# Patient Record
Sex: Female | Born: 1940 | Race: White | Hispanic: No | Smoking: Never smoker
Health system: Southern US, Community
[De-identification: ages and names within clinical notes are randomized; demographics above are authoritative.]

## PROBLEM LIST (undated history)

## (undated) DIAGNOSIS — R5383 Other fatigue: Secondary | ICD-10-CM

## (undated) DIAGNOSIS — R1310 Dysphagia, unspecified: Secondary | ICD-10-CM

## (undated) DIAGNOSIS — J479 Bronchiectasis, uncomplicated: Secondary | ICD-10-CM

## (undated) DIAGNOSIS — M8580 Other specified disorders of bone density and structure, unspecified site: Secondary | ICD-10-CM

## (undated) DIAGNOSIS — K5790 Diverticulosis of intestine, part unspecified, without perforation or abscess without bleeding: Secondary | ICD-10-CM

## (undated) DIAGNOSIS — K219 Gastro-esophageal reflux disease without esophagitis: Secondary | ICD-10-CM

## (undated) DIAGNOSIS — D649 Anemia, unspecified: Secondary | ICD-10-CM

## (undated) DIAGNOSIS — R0600 Dyspnea, unspecified: Secondary | ICD-10-CM

## (undated) DIAGNOSIS — Z923 Personal history of irradiation: Secondary | ICD-10-CM

## (undated) DIAGNOSIS — K838 Other specified diseases of biliary tract: Secondary | ICD-10-CM

## (undated) DIAGNOSIS — C50911 Malignant neoplasm of unspecified site of right female breast: Secondary | ICD-10-CM

## (undated) DIAGNOSIS — R634 Abnormal weight loss: Secondary | ICD-10-CM

## (undated) DIAGNOSIS — R110 Nausea: Secondary | ICD-10-CM

## (undated) DIAGNOSIS — G252 Other specified forms of tremor: Secondary | ICD-10-CM

## (undated) DIAGNOSIS — J189 Pneumonia, unspecified organism: Secondary | ICD-10-CM

## (undated) DIAGNOSIS — T7840XA Allergy, unspecified, initial encounter: Secondary | ICD-10-CM

## (undated) DIAGNOSIS — R5381 Other malaise: Secondary | ICD-10-CM

## (undated) DIAGNOSIS — N83209 Unspecified ovarian cyst, unspecified side: Secondary | ICD-10-CM

## (undated) DIAGNOSIS — M722 Plantar fascial fibromatosis: Secondary | ICD-10-CM

## (undated) DIAGNOSIS — R7989 Other specified abnormal findings of blood chemistry: Secondary | ICD-10-CM

## (undated) DIAGNOSIS — H269 Unspecified cataract: Secondary | ICD-10-CM

## (undated) DIAGNOSIS — E538 Deficiency of other specified B group vitamins: Secondary | ICD-10-CM

## (undated) DIAGNOSIS — A310 Pulmonary mycobacterial infection: Secondary | ICD-10-CM

## (undated) DIAGNOSIS — M5126 Other intervertebral disc displacement, lumbar region: Secondary | ICD-10-CM

## (undated) DIAGNOSIS — G43909 Migraine, unspecified, not intractable, without status migrainosus: Secondary | ICD-10-CM

## (undated) HISTORY — PX: COLONOSCOPY: SHX174

## (undated) HISTORY — DX: Other specified disorders of bone density and structure, unspecified site: M85.80

## (undated) HISTORY — DX: Deficiency of other specified B group vitamins: E53.8

## (undated) HISTORY — DX: Nausea: R11.0

## (undated) HISTORY — DX: Bronchiectasis, uncomplicated: J47.9

## (undated) HISTORY — DX: Unspecified cataract: H26.9

## (undated) HISTORY — PX: LAPAROSCOPIC CHOLECYSTECTOMY: SUR755

## (undated) HISTORY — PX: NOSE SURGERY: SHX723

## (undated) HISTORY — DX: Unspecified ovarian cyst, unspecified side: N83.209

## (undated) HISTORY — DX: Anemia, unspecified: D64.9

## (undated) HISTORY — DX: Other fatigue: R53.83

## (undated) HISTORY — DX: Diverticulosis of intestine, part unspecified, without perforation or abscess without bleeding: K57.90

## (undated) HISTORY — PX: TOTAL VAGINAL HYSTERECTOMY: SHX2548

## (undated) HISTORY — PX: BREAST LUMPECTOMY: SHX2

## (undated) HISTORY — DX: Migraine, unspecified, not intractable, without status migrainosus: G43.909

## (undated) HISTORY — PX: MASTECTOMY PARTIAL / LUMPECTOMY: SUR851

## (undated) HISTORY — DX: Allergy, unspecified, initial encounter: T78.40XA

## (undated) HISTORY — DX: Other malaise: R53.81

## (undated) HISTORY — DX: Other specified abnormal findings of blood chemistry: R79.89

## (undated) HISTORY — PX: MASTOIDECTOMY: SHX711

## (undated) HISTORY — PX: MYRINGOTOMY: SUR874

## (undated) HISTORY — PX: UPPER GASTROINTESTINAL ENDOSCOPY: SHX188

## (undated) HISTORY — DX: Abnormal weight loss: R63.4

## (undated) HISTORY — DX: Other intervertebral disc displacement, lumbar region: M51.26

## (undated) HISTORY — PX: TONSILLECTOMY: SUR1361

## (undated) HISTORY — PX: CATARACT EXTRACTION: SUR2

## (undated) HISTORY — DX: Malignant neoplasm of unspecified site of right female breast: C50.911

## (undated) HISTORY — PX: ABDOMINAL HYSTERECTOMY: SHX81

## (undated) HISTORY — DX: Dysphagia, unspecified: R13.10

## (undated) HISTORY — DX: Other specified forms of tremor: G25.2

## (undated) HISTORY — DX: Pulmonary mycobacterial infection: A31.0

## (undated) HISTORY — DX: Other specified diseases of biliary tract: K83.8

## (undated) HISTORY — DX: Gastro-esophageal reflux disease without esophagitis: K21.9

## (undated) HISTORY — DX: Plantar fascial fibromatosis: M72.2

---

## 1997-09-03 ENCOUNTER — Emergency Department (HOSPITAL_COMMUNITY): Admission: EM | Admit: 1997-09-03 | Discharge: 1997-09-03 | Payer: Self-pay | Admitting: Internal Medicine

## 1997-09-13 ENCOUNTER — Emergency Department (HOSPITAL_COMMUNITY): Admission: EM | Admit: 1997-09-13 | Discharge: 1997-09-13 | Payer: Self-pay

## 1997-10-18 ENCOUNTER — Other Ambulatory Visit: Admission: RE | Admit: 1997-10-18 | Discharge: 1997-10-18 | Payer: Self-pay | Admitting: Obstetrics and Gynecology

## 1999-02-02 ENCOUNTER — Other Ambulatory Visit: Admission: RE | Admit: 1999-02-02 | Discharge: 1999-02-02 | Payer: Self-pay | Admitting: Obstetrics and Gynecology

## 2000-01-25 ENCOUNTER — Other Ambulatory Visit: Admission: RE | Admit: 2000-01-25 | Discharge: 2000-01-25 | Payer: Self-pay | Admitting: Obstetrics and Gynecology

## 2000-09-29 ENCOUNTER — Ambulatory Visit: Admission: RE | Admit: 2000-09-29 | Discharge: 2000-09-29 | Payer: Self-pay | Admitting: Pulmonary Disease

## 2001-01-07 ENCOUNTER — Other Ambulatory Visit: Admission: RE | Admit: 2001-01-07 | Discharge: 2001-01-07 | Payer: Self-pay | Admitting: Obstetrics and Gynecology

## 2001-01-12 ENCOUNTER — Ambulatory Visit (HOSPITAL_COMMUNITY): Admission: RE | Admit: 2001-01-12 | Discharge: 2001-01-12 | Payer: Self-pay | Admitting: Internal Medicine

## 2001-11-17 ENCOUNTER — Encounter: Payer: Self-pay | Admitting: Surgery

## 2001-11-18 ENCOUNTER — Encounter (INDEPENDENT_AMBULATORY_CARE_PROVIDER_SITE_OTHER): Payer: Self-pay | Admitting: Specialist

## 2001-11-18 ENCOUNTER — Observation Stay (HOSPITAL_COMMUNITY): Admission: RE | Admit: 2001-11-18 | Discharge: 2001-11-19 | Payer: Self-pay | Admitting: Surgery

## 2001-11-18 ENCOUNTER — Encounter: Payer: Self-pay | Admitting: Surgery

## 2002-03-30 ENCOUNTER — Emergency Department (HOSPITAL_COMMUNITY): Admission: EM | Admit: 2002-03-30 | Discharge: 2002-03-30 | Payer: Self-pay | Admitting: Emergency Medicine

## 2003-01-07 ENCOUNTER — Encounter: Admission: RE | Admit: 2003-01-07 | Discharge: 2003-01-07 | Payer: Self-pay | Admitting: Surgery

## 2003-01-25 ENCOUNTER — Ambulatory Visit (HOSPITAL_BASED_OUTPATIENT_CLINIC_OR_DEPARTMENT_OTHER): Admission: RE | Admit: 2003-01-25 | Discharge: 2003-01-25 | Payer: Self-pay | Admitting: Surgery

## 2003-01-25 ENCOUNTER — Ambulatory Visit (HOSPITAL_COMMUNITY): Admission: RE | Admit: 2003-01-25 | Discharge: 2003-01-25 | Payer: Self-pay | Admitting: Surgery

## 2003-01-25 ENCOUNTER — Encounter (INDEPENDENT_AMBULATORY_CARE_PROVIDER_SITE_OTHER): Payer: Self-pay | Admitting: *Deleted

## 2004-01-30 ENCOUNTER — Ambulatory Visit: Payer: Self-pay | Admitting: Pulmonary Disease

## 2004-05-08 ENCOUNTER — Observation Stay (HOSPITAL_COMMUNITY): Admission: EM | Admit: 2004-05-08 | Discharge: 2004-05-09 | Payer: Self-pay | Admitting: Emergency Medicine

## 2004-06-13 ENCOUNTER — Ambulatory Visit: Payer: Self-pay | Admitting: Pulmonary Disease

## 2004-07-26 ENCOUNTER — Ambulatory Visit: Payer: Self-pay | Admitting: Pulmonary Disease

## 2004-10-23 ENCOUNTER — Ambulatory Visit: Payer: Self-pay | Admitting: Internal Medicine

## 2004-11-06 ENCOUNTER — Ambulatory Visit: Payer: Self-pay | Admitting: Pulmonary Disease

## 2004-11-20 ENCOUNTER — Ambulatory Visit: Payer: Self-pay | Admitting: Pulmonary Disease

## 2004-12-11 ENCOUNTER — Ambulatory Visit: Payer: Self-pay | Admitting: Pulmonary Disease

## 2004-12-14 ENCOUNTER — Ambulatory Visit: Payer: Self-pay | Admitting: Internal Medicine

## 2004-12-27 ENCOUNTER — Ambulatory Visit: Payer: Self-pay | Admitting: Pulmonary Disease

## 2004-12-27 LAB — PULMONARY FUNCTION TEST

## 2005-01-24 ENCOUNTER — Ambulatory Visit: Payer: Self-pay | Admitting: Pulmonary Disease

## 2005-01-30 ENCOUNTER — Ambulatory Visit: Payer: Self-pay | Admitting: Internal Medicine

## 2005-05-13 ENCOUNTER — Ambulatory Visit: Payer: Self-pay | Admitting: Emergency Medicine

## 2005-05-16 ENCOUNTER — Ambulatory Visit: Admission: RE | Admit: 2005-05-16 | Discharge: 2005-05-16 | Payer: Self-pay | Admitting: Emergency Medicine

## 2005-06-17 ENCOUNTER — Ambulatory Visit: Payer: Self-pay | Admitting: Emergency Medicine

## 2005-09-03 ENCOUNTER — Ambulatory Visit: Payer: Self-pay | Admitting: Emergency Medicine

## 2005-09-18 ENCOUNTER — Ambulatory Visit: Payer: Self-pay | Admitting: Emergency Medicine

## 2005-10-11 ENCOUNTER — Ambulatory Visit (HOSPITAL_COMMUNITY): Admission: RE | Admit: 2005-10-11 | Discharge: 2005-10-11 | Payer: Self-pay | Admitting: Emergency Medicine

## 2006-01-01 ENCOUNTER — Ambulatory Visit: Payer: Self-pay | Admitting: Emergency Medicine

## 2006-02-07 ENCOUNTER — Ambulatory Visit: Payer: Self-pay | Admitting: Emergency Medicine

## 2006-02-14 ENCOUNTER — Encounter: Admission: RE | Admit: 2006-02-14 | Discharge: 2006-02-14 | Payer: Self-pay | Admitting: Otolaryngology

## 2006-03-24 ENCOUNTER — Ambulatory Visit: Payer: Self-pay | Admitting: Infectious Diseases

## 2006-03-24 DIAGNOSIS — A310 Pulmonary mycobacterial infection: Secondary | ICD-10-CM | POA: Insufficient documentation

## 2006-03-24 DIAGNOSIS — L0390 Cellulitis, unspecified: Secondary | ICD-10-CM

## 2006-03-24 DIAGNOSIS — M818 Other osteoporosis without current pathological fracture: Secondary | ICD-10-CM

## 2006-03-24 DIAGNOSIS — L0291 Cutaneous abscess, unspecified: Secondary | ICD-10-CM

## 2006-07-02 ENCOUNTER — Ambulatory Visit: Payer: Self-pay | Admitting: Infectious Diseases

## 2006-08-28 ENCOUNTER — Ambulatory Visit: Payer: Self-pay | Admitting: Emergency Medicine

## 2006-08-29 ENCOUNTER — Ambulatory Visit: Payer: Self-pay | Admitting: Cardiology

## 2006-10-06 ENCOUNTER — Ambulatory Visit: Payer: Self-pay | Admitting: Emergency Medicine

## 2006-12-04 DIAGNOSIS — J449 Chronic obstructive pulmonary disease, unspecified: Secondary | ICD-10-CM | POA: Insufficient documentation

## 2006-12-04 DIAGNOSIS — J479 Bronchiectasis, uncomplicated: Secondary | ICD-10-CM | POA: Insufficient documentation

## 2006-12-04 DIAGNOSIS — G43909 Migraine, unspecified, not intractable, without status migrainosus: Secondary | ICD-10-CM | POA: Insufficient documentation

## 2007-01-13 ENCOUNTER — Ambulatory Visit: Payer: Self-pay | Admitting: Emergency Medicine

## 2007-01-16 ENCOUNTER — Ambulatory Visit: Payer: Self-pay | Admitting: Internal Medicine

## 2007-01-16 ENCOUNTER — Ambulatory Visit: Admission: RE | Admit: 2007-01-16 | Discharge: 2007-01-16 | Payer: Self-pay | Admitting: Emergency Medicine

## 2007-01-21 ENCOUNTER — Ambulatory Visit: Admission: RE | Admit: 2007-01-21 | Discharge: 2007-01-21 | Payer: Self-pay | Admitting: Emergency Medicine

## 2007-01-28 ENCOUNTER — Ambulatory Visit: Payer: Self-pay | Admitting: Emergency Medicine

## 2007-01-28 DIAGNOSIS — J309 Allergic rhinitis, unspecified: Secondary | ICD-10-CM | POA: Insufficient documentation

## 2007-02-04 ENCOUNTER — Ambulatory Visit: Admission: RE | Admit: 2007-02-04 | Discharge: 2007-02-04 | Payer: Self-pay | Admitting: Emergency Medicine

## 2007-02-04 ENCOUNTER — Encounter: Payer: Self-pay | Admitting: Emergency Medicine

## 2007-02-04 ENCOUNTER — Ambulatory Visit: Payer: Self-pay | Admitting: Emergency Medicine

## 2007-02-20 ENCOUNTER — Encounter: Admission: RE | Admit: 2007-02-20 | Discharge: 2007-02-20 | Payer: Self-pay | Admitting: Radiology

## 2007-02-24 ENCOUNTER — Telehealth (INDEPENDENT_AMBULATORY_CARE_PROVIDER_SITE_OTHER): Payer: Self-pay | Admitting: *Deleted

## 2007-03-02 ENCOUNTER — Encounter: Admission: RE | Admit: 2007-03-02 | Discharge: 2007-03-02 | Payer: Self-pay | Admitting: Obstetrics and Gynecology

## 2007-03-17 ENCOUNTER — Ambulatory Visit: Payer: Self-pay | Admitting: Emergency Medicine

## 2007-03-24 ENCOUNTER — Encounter: Admission: RE | Admit: 2007-03-24 | Discharge: 2007-03-24 | Payer: Self-pay | Admitting: Surgery

## 2007-03-24 ENCOUNTER — Encounter (INDEPENDENT_AMBULATORY_CARE_PROVIDER_SITE_OTHER): Payer: Self-pay | Admitting: Surgery

## 2007-03-24 ENCOUNTER — Ambulatory Visit (HOSPITAL_BASED_OUTPATIENT_CLINIC_OR_DEPARTMENT_OTHER): Admission: RE | Admit: 2007-03-24 | Discharge: 2007-03-24 | Payer: Self-pay | Admitting: Surgery

## 2007-04-07 ENCOUNTER — Ambulatory Visit (HOSPITAL_BASED_OUTPATIENT_CLINIC_OR_DEPARTMENT_OTHER): Admission: RE | Admit: 2007-04-07 | Discharge: 2007-04-07 | Payer: Self-pay | Admitting: Surgery

## 2007-04-07 ENCOUNTER — Encounter (INDEPENDENT_AMBULATORY_CARE_PROVIDER_SITE_OTHER): Payer: Self-pay | Admitting: Surgery

## 2007-04-15 ENCOUNTER — Ambulatory Visit: Payer: Self-pay | Admitting: Oncology

## 2007-04-17 ENCOUNTER — Ambulatory Visit: Admission: RE | Admit: 2007-04-17 | Discharge: 2007-07-02 | Payer: Self-pay | Admitting: Radiation Oncology

## 2007-05-04 LAB — CBC WITH DIFFERENTIAL/PLATELET
Basophils Absolute: 0 10*3/uL (ref 0.0–0.1)
EOS%: 5.5 % (ref 0.0–7.0)
Eosinophils Absolute: 0.3 10*3/uL (ref 0.0–0.5)
HCT: 38.4 % (ref 34.8–46.6)
HGB: 13.6 g/dL (ref 11.6–15.9)
MCH: 32.8 pg (ref 26.0–34.0)
MONO#: 0.3 10*3/uL (ref 0.1–0.9)
NEUT#: 3.2 10*3/uL (ref 1.5–6.5)
NEUT%: 69.3 % (ref 39.6–76.8)
RDW: 13.4 % (ref 11.3–14.5)
WBC: 4.7 10*3/uL (ref 3.9–10.0)
lymph#: 0.9 10*3/uL (ref 0.9–3.3)

## 2007-05-05 LAB — COMPREHENSIVE METABOLIC PANEL
ALT: 11 U/L (ref 0–35)
CO2: 24 mEq/L (ref 19–32)
Calcium: 9.3 mg/dL (ref 8.4–10.5)
Chloride: 106 mEq/L (ref 96–112)
Glucose, Bld: 102 mg/dL — ABNORMAL HIGH (ref 70–99)
Sodium: 141 mEq/L (ref 135–145)
Total Bilirubin: 0.6 mg/dL (ref 0.3–1.2)
Total Protein: 6.7 g/dL (ref 6.0–8.3)

## 2007-05-05 LAB — LACTATE DEHYDROGENASE: LDH: 142 U/L (ref 94–250)

## 2007-05-05 LAB — VITAMIN D 25 HYDROXY (VIT D DEFICIENCY, FRACTURES): Vit D, 25-Hydroxy: 21 ng/mL — ABNORMAL LOW (ref 30–89)

## 2007-05-07 LAB — VITAMIN D 1,25 DIHYDROXY: Vit D, 1,25-Dihydroxy: 58 pg/mL (ref 6–62)

## 2007-07-27 ENCOUNTER — Ambulatory Visit: Payer: Self-pay | Admitting: Oncology

## 2009-08-10 ENCOUNTER — Telehealth (INDEPENDENT_AMBULATORY_CARE_PROVIDER_SITE_OTHER): Payer: Self-pay | Admitting: *Deleted

## 2009-08-11 ENCOUNTER — Ambulatory Visit: Payer: Self-pay | Admitting: Emergency Medicine

## 2009-08-11 DIAGNOSIS — C50919 Malignant neoplasm of unspecified site of unspecified female breast: Secondary | ICD-10-CM | POA: Insufficient documentation

## 2009-08-11 DIAGNOSIS — R05 Cough: Secondary | ICD-10-CM | POA: Insufficient documentation

## 2009-08-11 DIAGNOSIS — R059 Cough, unspecified: Secondary | ICD-10-CM | POA: Insufficient documentation

## 2009-08-31 ENCOUNTER — Ambulatory Visit: Payer: Self-pay | Admitting: Emergency Medicine

## 2009-09-05 ENCOUNTER — Encounter: Payer: Self-pay | Admitting: Emergency Medicine

## 2010-01-17 ENCOUNTER — Ambulatory Visit: Payer: Self-pay | Admitting: Emergency Medicine

## 2010-02-15 ENCOUNTER — Ambulatory Visit: Payer: Self-pay | Admitting: Emergency Medicine

## 2010-02-16 ENCOUNTER — Ambulatory Visit: Payer: Self-pay | Admitting: Cardiovascular Disease

## 2010-02-28 ENCOUNTER — Ambulatory Visit
Admission: RE | Admit: 2010-02-28 | Discharge: 2010-02-28 | Payer: Self-pay | Source: Home / Self Care | Attending: Emergency Medicine | Admitting: Emergency Medicine

## 2010-03-20 NOTE — Assessment & Plan Note (Signed)
Summary: cough   Visit Type:  Follow-up Primary Provider/Referring Provider:  Dr. Jarome Matin  CC:  Cough follow-up. The cough is no better. Hoarseness has improved. Cough is worse at night. Nasal wahes have helped with nasal congestion.Marland Kitchen  History of Present Illness: [name] is a 70 year old woman with a history of bronchiectasis, Mycobacterium avium complex colonization, allergic rhinitis and chronic cough.    ROV 08/11/09 -- returns for acute visit, has hx bronchiectasis, allergies, chronic cough. Had been doing well until about 3 weeks ago with nasal drainage, hoarse voice. Was treated with a brief course pred, nasal steroid, doxycycline. While on the pred her UA irritation was better. She is having indigestion and heartburn when she lays down at night. Still nasal drainage.   ROV 08/31/09 -- follows up for cough, hoarse voice. We tried a 2 weeks course of prilosec, added NSW's at bedtime. Was on nasonex, ran out since last time, not currently on it. Stil doing a lot of throat clearing. her hoarseness is a lot better, still coughing every day but less productive. She is also complaining of more SOB. Still with some heartburn, happens when she lays supine.   Current Medications (verified): 1)  Imitrex 100 Mg  Tabs (Sumatriptan Succinate) .Marland Kitchen.. 1 Tab By Mouth As Needed 2)  Imitrex Statdose System 6 Mg/0.83ml  Kit (Sumatriptan Succinate) .... As Needed 3)  Topamax 200 Mg  Tabs (Topiramate) .... Take 1 Tablet By Mouth Once A Day 4)  Ibuprofen 200 Mg Tabs (Ibuprofen) .... As Needed 5)  Nasonex 50 Mcg/act Susp (Mometasone Furoate) .... 2 Sprays in Each Nostril Daily 6)  Nasal Wash .... Once Daily  Allergies (verified): 1)  ! Sulfa 2)  ! Barbiturates 3)  ! Codeine 4)  ! * Dhe 5)  ! Demerol 6)  ! * Droperidol  Vital Signs:  Patient profile:   70 year old female Height:      67 inches (170.18 cm) Weight:      143 pounds (65 kg) BMI:     22.48 O2 Sat:      97 % on Room air Temp:      98.0 degrees F (36.67 degrees C) oral Pulse rate:   91 / minute BP sitting:   110 / 82  (left arm) Cuff size:   regular  Vitals Entered By: Michel Bickers CMA (August 31, 2009 2:51 PM)  O2 Sat at Rest %:  97 O2 Flow:  Room air CC: Cough follow-up. The cough is no better. Hoarseness has improved. Cough is worse at night. Nasal wahes have helped with nasal congestion. Comments Medications reviewed. Daytime phone verified. Michel Bickers CMA  August 31, 2009 2:54 PM   Physical Exam  General:  well developed, well nourished, in no acute distress Head:  normocephalic and atraumatic Nose:  no deformity, discharge, inflammation, or lesions Mouth:  no deformity or lesions Neck:  no masses, thyromegaly, or abnormal cervical nodes, hoarse voice Chest Wall:  no deformities noted Lungs:  clear bilaterally Heart:  regular rate and rhythm, S1, S2 without murmurs, rubs, gallops, or clicks Abdomen:  not examined Msk:  no deformity or scoliosis noted with normal posture Extremities:  no clubbing, cyanosis, edema, or deformity noted Psych:  alert and cooperative; normal mood and affect; normal attention span and concentration   Impression & Recommendations:  Problem # 1:  COUGH (ICD-786.2)  Continue to do nasal saline washes every day restart omeprazole 20mg  by mouth once daily  start fluticasone nasal spray,  2 sprays each nostril two times a day start loratadine 10mg  by mouth once daily  We will set up follow up with Dr Lazarus Salines regarding your sinuses and nasal congestion.  Follow up with Dr Delton Coombes in 1 month. If you are not improving then we may decide to perform a CT scan or pulmonary function testing.   Orders: Est. Patient Level IV (09811)  Problem # 2:  BRONCHIECTASIS (ICD-494.0)  Problem # 3:  COPD, MILD (ICD-496)  Medications Added to Medication List This Visit: 1)  Nasal Wash  .... Once daily  Patient Instructions: 1)  Continue to do nasal saline washes every day 2)  restart  omeprazole 20mg  by mouth once daily  3)  start fluticasone nasal spray, 2 sprays each nostril two times a day 4)  start loratadine 10mg  by mouth once daily  5)  We will set up follow up with Dr Lazarus Salines regarding your sinuses and nasal congestion.  6)  Follow up with Dr Delton Coombes in 1 month. If you are not improving then we may decide to perform a CT scan or pulmonary function testing.

## 2010-03-20 NOTE — Progress Notes (Signed)
Summary: SOB/ COUGH  Phone Note Call from Patient   Caller: Patient Call For: byrum Summary of Call: pt has not been seen since 03/17/07. today c/o persistant cough/ SOB/ hoarsness x 2 wks. saw her pcp for this. tx w/ 10 days of doxycycline and 5 days of prednisone. was told by her pcp today- dr Jarold Motto w/ [facility]- to f/u w/ pulm dr since she isn't better. i made an appt w/ rb's 1st avail- 7/14. i did offer an appt w/ tp sooner (which she did not want) but did ask if dr byrum would see her sooner.  (appt w/ rb remains in the meantime) call cell [phone] first or home #above.  Initial call taken by: Tivis Ringer, CNA,  August 10, 2009 3:02 PM  Follow-up for Phone Call        Lawson Fiscal, pls advise if pt can be worked in with RB before scheduled appt on 7/14.  Thanks! Gweneth Dimitri RN  August 10, 2009 3:11 PM   The patient will see RB on 08/11/2009 @ 4:30 and will bring her most recent CXR from [facility] so RB can review. Follow-up by: Michel Bickers CMA,  August 10, 2009 5:05 PM

## 2010-03-20 NOTE — Assessment & Plan Note (Signed)
Summary: bronchiectasis, cough   Visit Type:  Follow-up Primary Provider/Referring Provider:  Dr. Jarome Matin  CC:  Bronchiectasis patient last seen 03/17/2007. The patient c/o sinus pressure and drainage for several weeks. PCP gave her 10 days on doxy and 5 days on Prednisone. She did improve slightly at the time but her symptoms came right back. She says her cough is dry and she is hoarse.Marland Kitchen  History of Present Illness: [name] is a 70 year old woman with a history of bronchiectasis, Mycobacterium avium complex colonization, allergic rhinitis and chronic cough.    ROV 08/11/09 -- returns for acute visit, has hx bronchiectasis, allergies, chronic cough. Had been doing well until about 3 weeks ago with nasal drainage, hoarse voice. Was treated with a brief course pred, nasal steroid, doxycycline. While on the pred her UA irritation was better. She is having indigestion and heartburn when she lays down at night. Still nasal drainage.   Current Medications (verified): 1)  Imitrex 100 Mg  Tabs (Sumatriptan Succinate) .Marland Kitchen.. 1 Tab By Mouth As Needed 2)  Imitrex Statdose System 6 Mg/0.68ml  Kit (Sumatriptan Succinate) .... As Needed 3)  Topamax 200 Mg  Tabs (Topiramate) .... Take 1 Tablet By Mouth Once A Day 4)  Ibuprofen 200 Mg Tabs (Ibuprofen) .... As Needed 5)  Nasonex 50 Mcg/act Susp (Mometasone Furoate) .... 2 Sprays in Each Nostril Daily  Allergies (verified): 1)  ! Sulfa 2)  ! Barbiturates 3)  ! Codeine 4)  ! * Dhe 5)  ! Demerol 6)  ! 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 The patient c/o sinus pressure and drainage for several weeks. PCP gave her 10 days on doxy and 5 days on Prednisone. She did improve slightly at the time but her symptoms came right back. She says her cough is dry and she is hoarse. Comments Medications reviewed. Daytime phone verified. Michel Bickers CMA  August 11, 2009 4:41 PM   Physical Exam  General:  well developed, well nourished, in no acute distress Head:  normocephalic and atraumatic Nose:  no deformity, discharge, inflammation, or lesions Mouth:  no deformity or lesions Neck:  no masses, thyromegaly, or abnormal cervical nodes, hoarse voice Chest Wall:  no deformities noted Lungs:  clear bilaterally Heart:  regular rate and rhythm, S1, S2 without murmurs, rubs, gallops, or clicks Abdomen:  not examined Msk:  no deformity or scoliosis noted with normal posture Extremities:  no clubbing, cyanosis, edema, or deformity noted Psych:  alert and cooperative; normal mood and affect; normal attention span and concentration   Impression &  Recommendations:  Problem # 1:  COUGH (ICD-786.2)  In aftermath of recent URI. Need to atempt to treat factors affecting cough: GERD, allergic rhinitis,  - NSW - nasal streroid - treat GERD short term - no eval for bronchiectasis progresion at this time, consider CT if persists or progresses.   Orders: Est. Patient Level IV (16109)  Medications Added to Medication List This Visit: 1)  Ibuprofen 200 Mg Tabs (Ibuprofen) .... As needed 2)  Nasonex 50 Mcg/act Susp (Mometasone furoate)  .... 2 sprays in each nostril daily  Patient Instructions: 1)  Start doing Nasal Saline Washes once daily for the next 2 weeks 2)  Continue your nasacort once daily 3)  Start omeprazole (Prilosec) 20mg  by mouth once daily for the next 2 weeks 4)  Try to avoid throat clearing and rest your voice as much as possible.  5)  Follow with Dr Delton Coombes in 3 weeks or as needed

## 2010-03-20 NOTE — Letter (Signed)
Summary: [facility]  [facility]   Imported By: Lester [place] 09/22/2009 13:24:49  _____________________________________________________________________  External Attachment:    Type:   Image     Comment:   External Document

## 2010-03-20 NOTE — Assessment & Plan Note (Signed)
Summary: URI, cough, bronchiectasis   Visit Type:  Follow-up Primary Provider/Referring Provider:  Dr. Jarome Matin  CC:  Pt c/o cough and sinus congestion w/ thick yellow to green sputum...sxs started after taking Clindamycin 300mg  three times daily for 7 days after root canal.  History of Present Illness: [name] is a 70 year old woman with a history of bronchiectasis, Mycobacterium avium complex colonization, allergic rhinitis and chronic cough.    ROV 08/11/09 -- returns for acute visit, has hx bronchiectasis, allergies, chronic cough. Had been doing well until about 3 weeks ago with nasal drainage, hoarse voice. Was treated with a brief course pred, nasal steroid, doxycycline. While on the pred her UA irritation was better. She is having indigestion and heartburn when she lays down at night. Still nasal drainage.   ROV 08/31/09 -- follows up for cough, hoarse voice. We tried a 2 weeks course of prilosec, added NSW's at bedtime. Was on nasonex, ran out since last time, not currently on it. Stil doing a lot of throat clearing. her hoarseness is a lot better, still coughing every day but less productive. She is also complaining of more SOB. Still with some heartburn, happens when she lays supine.   ROV 01/17/10 -- follow up for her allergic rhinitis, chronic cough, bronchiectasis with MAIC colonization. has been blowing thick mucous from head and sinuses, started cough. Clear thick sputum. Has used [employer], doing NSW's, fluticasone spray. Not taking any loratadine - had more nasal congestion with it. No decongestants currently. Having HA's, some green nasal mucous.   Current Medications (verified): 1)  Imitrex 100 Mg  Tabs (Sumatriptan Succinate) .Marland Kitchen.. 1 Tab By Mouth As Needed 2)  Imitrex Statdose System 6 Mg/0.20ml  Kit (Sumatriptan Succinate) .... As Needed 3)  Topamax 200 Mg  Tabs (Topiramate) .... Take 1 Tablet By Mouth Once A Day 4)  Ibuprofen 200 Mg Tabs (Ibuprofen) .... As  Needed 5)  Fluticasone Propionate 50 Mcg/act Susp (Fluticasone Propionate) .... 2 Sprays Each Nostril Two Times A Day 6)  Nasal Wash .... Once Daily 7)  Omeprazole 20 Mg Cpdr (Omeprazole) .Marland Kitchen.. 1 By Mouth Once Daily 8)  Bupropion Hcl 100 Mg Tabs (Bupropion Hcl) .Marland Kitchen.. 1 By Mouth Daily 9)  Benzonatate 200 Mg Caps (Benzonatate) .Marland Kitchen.. 1 By Mouth 2 To 3 Times Daily As Needed  Allergies (verified): 1)  ! Sulfa 2)  ! Barbiturates 3)  ! Codeine 4)  ! * Dhe 5)  ! Demerol 6)  ! * Droperidol  Vital Signs:  Patient profile:   70 year old female Height:      67 inches (170.18 cm) Weight:      145.50 pounds (66.14 kg) BMI:     22.87 O2 Sat:      97 % on Room air Temp:     97.9 degrees F (36.61 degrees C) oral Pulse rate:   103 / minute BP sitting:   124 / 74  (right arm) Cuff size:   regular  Vitals Entered By: Michel Bickers CMA (January 17, 2010 4:33 PM)  O2 Sat at Rest %:  97 O2 Flow:  Room air CC: Pt c/o cough and sinus congestion w/ thick yellow to green sputum...sxs started after taking Clindamycin 300mg  three times daily for 7 days after root canal Comments Medications reviewed with patient Michel Bickers CMA  January 17, 2010 4:34 PM   Physical Exam  General:  well developed, well nourished, in no acute distress Head:  normocephalic and atraumatic, no sinus  Nose:  no deformity, discharge, inflammation, or lesions Mouth:  no deformity or lesions Neck:  no masses, thyromegaly, or abnormal cervical nodes, hoarse voice Chest Wall:  no deformities noted Lungs:  clear bilaterally Heart:  regular rate and rhythm, S1, S2 without murmurs, rubs, gallops, or clicks Abdomen:  not examined Msk:  no deformity or scoliosis noted with normal posture Extremities:  no clubbing, cyanosis, edema, or deformity noted Psych:  alert and cooperative; normal mood and affect; normal attention span and concentration   Impression & Recommendations:  Problem # 1:  COUGH (ICD-786.2)  Due to viral  URI. Continue your nasal saline washes Continue your fluticasone spray two times a day Stop loratadine Use OTC decongestants that contain chlorphenerimine or bromphenerimine Call our office if you develop fevers, your mucous changes to dark color, or if you feel worse in any way.  Follow up in 1 month   Orders: Est. Patient Level IV (16109)  Problem # 2:  BRONCHIECTASIS (ICD-494.0) will reassess her after this illness resolves, decide whether any other workup (CT scan, FOB for [facility]) is necessary.   Medications Added to Medication List This Visit: 1)  Bupropion Hcl 100 Mg Tabs (Bupropion hcl) .Marland Kitchen.. 1 by mouth daily 2)  Benzonatate 200 Mg Caps (Benzonatate) .Marland Kitchen.. 1 by mouth 2 to 3 times daily as needed  Patient Instructions: 1)  Continue your nasal saline washes 2)  Continue your fluticasone spray two times a day 3)  Stop loratadine 4)  Use OTC decongestants that contain chlorphenerimine or bromphenerimine 5)  Call our office if you develop fevers, your mucous changes to dark color, or if you feel worse in any way.  6)  Follow up in 1 month    Immunization History:  Influenza Immunization History:    Influenza:  historical (12/07/2009)

## 2010-03-20 NOTE — Miscellaneous (Signed)
Summary: Orders Update  Clinical Lists Changes  Medications: Changed medication from NASONEX 50 MCG/ACT SUSP (MOMETASONE FUROATE) 2 sprays in each nostril daily to FLUTICASONE PROPIONATE 50 MCG/ACT SUSP (FLUTICASONE PROPIONATE) 2 sprays each nostril two times a day - Signed Added new medication of LORATADINE 10 MG TABS (LORATADINE) 1 by mouth once daily - Signed Added new medication of OMEPRAZOLE 20 MG CPDR (OMEPRAZOLE) 1 by mouth once daily - Signed Rx of FLUTICASONE PROPIONATE 50 MCG/ACT SUSP (FLUTICASONE PROPIONATE) 2 sprays each nostril two times a day;  #1 x 5;  Signed;  Entered by: Leslye Peer MD;  Authorized by: Leslye Peer MD;  Method used: Electronically to [facility]  [address] [phone]*, [address], [place], [place]  [address], Ph: [phone] or [phone], Fax: [phone] Rx of LORATADINE 10 MG TABS (LORATADINE) 1 by mouth once daily;  #30 x 11;  Signed;  Entered by: Leslye Peer MD;  Authorized by: Leslye Peer MD;  Method used: Electronically to [facility]  [address] [phone]*, [address] [place], [place]  [address], Ph: [phone] or [phone], Fax: [phone] Rx of OMEPRAZOLE 20 MG CPDR (OMEPRAZOLE) 1 by mouth once daily;  #30 x 11;  Signed;  Entered by: Leslye Peer MD;  Authorized by: Leslye Peer MD;  Method used: Electronically to [facility]  [address] [phone]*, [address] [place], [place]  [address], Ph: [phone] or [phone], Fax: [phone]    Prescriptions: OMEPRAZOLE 20 MG CPDR (OMEPRAZOLE) 1 by mouth once daily  #30 x 11   Entered and Authorized by:   Leslye Peer MD   Signed by:   Leslye Peer MD on 08/31/2009   Method used:   [facility]ronically to        [facility]  [address] [phone]* (retail)       [address]       [place], [place]  [address]       Ph: [phone] or [phone]       Fax: [phone]   RxID:   [id number] LORATADINE 10 MG TABS (LORATADINE) 1 by mouth once daily  #30 x 11   Entered and  Authorized by:   Leslye Peer MD   Signed by:   Leslye Peer MD on 08/31/2009   Method used:   [facility]ronically to        [facility]  [address] [phone]* (retail)       [address]       [place], [place]  [address]       Ph: [phone] or [phone]       Fax: [phone]   RxID:   [id number] FLUTICASONE PROPIONATE 50 MCG/ACT SUSP (FLUTICASONE PROPIONATE) 2 sprays each nostril two times a day  #1 x 5   Entered and Authorized by:   Leslye Peer MD   Signed by:   Leslye Peer MD on 08/31/2009   Method used:   [facility]ronically to        [facility]  [address] [phone]* (retail)       [address]       [place], [place]  [address]       Ph: [phone] or [phone]       Fax: [phone]   RxID:   [id number]

## 2010-03-22 NOTE — Assessment & Plan Note (Signed)
Summary: bronchiectasis, rhinitis, cough   Visit Type:  Follow-up Primary Provider/Referring Provider:  Dr. Jarome Matin  CC:  CT chest follow-up...pt says there is no change in her cough.  History of Present Illness: [name] is a 70 year old woman with a history of bronchiectasis, Mycobacterium avium complex colonization, allergic rhinitis and chronic cough.    ROV 08/11/09 -- returns for acute visit, has hx bronchiectasis, allergies, chronic cough. Had been doing well until about 3 weeks ago with nasal drainage, hoarse voice. Was treated with a brief course pred, nasal steroid, doxycycline. While on the pred her UA irritation was better. She is having indigestion and heartburn when she lays down at night. Still nasal drainage.   ROV 08/31/09 -- follows up for cough, hoarse voice. We tried a 2 weeks course of prilosec, added NSW's at bedtime. Was on nasonex, ran out since last time, not currently on it. Stil doing a lot of throat clearing. her hoarseness is a lot better, still coughing every day but less productive. She is also complaining of more SOB. Still with some heartburn, happens when she lays supine.   ROV 01/17/10 -- follow up for her allergic rhinitis, chronic cough, bronchiectasis with MAIC colonization. has been blowing thick mucous from head and sinuses, started cough. Clear thick sputum. Has used [employer], doing NSW's, fluticasone spray. Not taking any loratadine - had more nasal congestion with it. No decongestants currently. Having HA's, some green nasal mucous.   ROV 02/15/10 -- follows up allergies and cough, bronchiectasis (hx MAIC colonization). Saw her 11/30 for URI and worsening of her cough, congestion. Her URI improved and cough improved as well. Doing nasal washes as needed (hard to do consistently after her root canal recently). Also using fluticasone nasal spray, but only as needed.   ROV 02/28/10 -- f/u bronchiectasis, allergies, cough. Still dealing w root  canal, not yet back to consistent NSWs or fluticasone. Had high res CT scan to eval her bronchiectasis, shows some slight progression of her RML and lingular bronchiectasis without plugs or nodular changes. She is still having cough, usually dry, sometimes with thick clear sputum.  Current Medications (verified): 1)  Imitrex 100 Mg  Tabs (Sumatriptan Succinate) .Marland Kitchen.. 1 Tab By Mouth As Needed 2)  Imitrex Statdose System 6 Mg/0.13ml  Kit (Sumatriptan Succinate) .... As Needed 3)  Topamax 200 Mg  Tabs (Topiramate) .... Take 1 Tablet By Mouth Once A Day 4)  Ibuprofen 200 Mg Tabs (Ibuprofen) .... As Needed 5)  Fluticasone Propionate 50 Mcg/act Susp (Fluticasone Propionate) .... 2 Sprays Each Nostril Two Times A Day As Needed 6)  Nasal Wash .... Once Daily As Needed 7)  Omeprazole 20 Mg Cpdr (Omeprazole) .Marland Kitchen.. 1 By Mouth Once Daily 8)  Bupropion Hcl 100 Mg Tabs (Bupropion Hcl) .Marland Kitchen.. 1 By Mouth Daily 9)  Benzonatate 200 Mg Caps (Benzonatate) .Marland Kitchen.. 1 By Mouth 2 To 3 Times Daily As Needed 10)  Ultracet 37.5-325 Mg Tabs (Tramadol-Acetaminophen) .Marland Kitchen.. 1 Every 6 Hrs As Needed For Pain  Allergies (verified): 1)  ! Sulfa 2)  ! Barbiturates 3)  ! Codeine 4)  ! * Dhe 5)  ! Demerol 6)  ! * Droperidol  Vital Signs:  Patient profile:   70 year old female Height:      67 inches (170.18 cm) Weight:      146 pounds (66.36 kg) BMI:     22.95 O2 Sat:      97 % on Room air Temp:  97.7 degrees F (36.50 degrees C) oral Pulse rate:   109 / minute BP sitting:   116 / 70  (right arm) Cuff size:   regular  Vitals Entered By: Michel Bickers CMA (February 28, 2010 2:00 PM)  O2 Sat at Rest %:  97 O2 Flow:  Room air CC: CT chest follow-up...pt says there is no change in her cough Comments Medications reviewed with patient Michel Bickers CMA  February 28, 2010 2:00 PM   Physical Exam  General:  well developed, well nourished, in no acute distress Head:  normocephalic and atraumatic, no sinus  Nose:  no deformity,  discharge, inflammation, or lesions Mouth:  no deformity or lesions Neck:  no masses, thyromegaly, or abnormal cervical nodes, hoarse voice Chest Wall:  no deformities noted Lungs:  clear bilaterally Heart:  regular rate and rhythm, S1, S2 without murmurs, rubs, gallops, or clicks Abdomen:  not examined Msk:  no deformity or scoliosis noted with normal posture Extremities:  no clubbing, cyanosis, edema, or deformity noted Psych:  alert and cooperative; normal mood and affect; normal attention span and concentration   Impression & Recommendations:  Problem # 1:  COUGH (ICD-786.2)  She has both chronic rhinitis and bronchiectasis with hx MAIC (FOB 2008 negative for [employer]). Suspect biggest player here is her rhinitis, not currently on her NSW or nasal steroid regularly. CT scan with some slight progression, no nodular disease. Doubt that her symptoms re;late to [facility].  - get back on allergy regimen - defer repeat FOB or rx for [facility] at this time. If she progresses then we may decide to eval further.   Orders: Est. Patient Level IV (13086)  Patient Instructions: 1)  Restart your nasal saline washes once daily  2)  Use your fluticasone spray, 1 spray each side two times a day  3)  Follow up with Dr Delton Coombes in 6 months.  4)  If your cough worsens or if you start to bring up more mucous, then call our office. We may decide to evaluate your bronchiectasis further.

## 2010-03-22 NOTE — Assessment & Plan Note (Signed)
Summary: bronchiectasis   Visit Type:  Follow-up Primary Provider/Referring Provider:  Dr. Jarome Matin  CC:  1 month followup bronchiectasis.  Pt c/o dry cough x 2 days. She states that her breathing is the same- no better or worse.[name]  History of Present Illness: [name] is a 70 year old woman with a history of bronchiectasis, Mycobacterium avium complex colonization, allergic rhinitis and chronic cough.    ROV 08/11/09 -- returns for acute visit, has hx bronchiectasis, allergies, chronic cough. Had been doing well until about 3 weeks ago with nasal drainage, hoarse voice. Was treated with a brief course pred, nasal steroid, doxycycline. While on the pred her UA irritation was better. She is having indigestion and heartburn when she lays down at night. Still nasal drainage.   ROV 08/31/09 -- follows up for cough, hoarse voice. We tried a 2 weeks course of prilosec, added NSW's at bedtime. Was on nasonex, ran out since last time, not currently on it. Stil doing a lot of throat clearing. her hoarseness is a lot better, still coughing every day but less productive. She is also complaining of more SOB. Still with some heartburn, happens when she lays supine.   ROV 01/17/10 -- follow up for her allergic rhinitis, chronic cough, bronchiectasis with MAIC colonization. has been blowing thick mucous from head and sinuses, started cough. Clear thick sputum. Has used [employer], doing NSW's, fluticasone spray. Not taking any loratadine - had more nasal congestion with it. No decongestants currently. Having HA's, some green nasal mucous.   ROV 02/15/10 -- follows up allergies and cough, bronchiectasis (hx MAIC colonization). Saw her 11/30 for URI and worsening of her cough, congestion. Her URI improved and cough improved as well. Doing nasal washes as needed (hard to do consistently after her root canal recently). Also using fluticasone nasal spray, but only as needed.   Current Medications  (verified): 1)  Imitrex 100 Mg  Tabs (Sumatriptan Succinate) .[name].. 1 Tab By Mouth As Needed 2)  Imitrex Statdose System 6 Mg/0.58ml  Kit (Sumatriptan Succinate) .... As Needed 3)  Topamax 200 Mg  Tabs (Topiramate) .... Take 1 Tablet By Mouth Once A Day 4)  Ibuprofen 200 Mg Tabs (Ibuprofen) .... As Needed 5)  Fluticasone Propionate 50 Mcg/act Susp (Fluticasone Propionate) .... 2 Sprays Each Nostril Two Times A Day As Needed 6)  Nasal Wash .... Once Daily As Needed 7)  Omeprazole 20 Mg Cpdr (Omeprazole) .[name].. 1 By Mouth Once Daily 8)  Bupropion Hcl 100 Mg Tabs (Bupropion Hcl) .[name].. 1 By Mouth Daily 9)  Benzonatate 200 Mg Caps (Benzonatate) .[name].. 1 By Mouth 2 To 3 Times Daily As Needed 10)  Ultracet 37.5-325 Mg Tabs (Tramadol-Acetaminophen) .[name].. 1 Every 6 Hrs As Needed For Pain  Allergies (verified): 1)  ! Sulfa 2)  ! Barbiturates 3)  ! Codeine 4)  ! * Dhe 5)  ! Demerol 6)  ! * Droperidol  Vital Signs:  Patient profile:   70 year old female Weight:      144 pounds O2 Sat:      100 % on Room air Temp:     97.7 degrees F oral Pulse rate:   99 / minute BP sitting:   120 / 68  (left arm)  Vitals Entered By: Vernie Murders (February 15, 2010 1:54 PM)  O2 Flow:  Room air  Physical Exam  General:  well developed, well nourished, in no acute distress Head:  normocephalic and atraumatic, no sinus  Nose:  no deformity,  discharge, inflammation, or lesions Mouth:  no deformity or lesions Neck:  no masses, thyromegaly, or abnormal cervical nodes, hoarse voice Chest Wall:  no deformities noted Lungs:  clear bilaterally Heart:  regular rate and rhythm, S1, S2 without murmurs, rubs, gallops, or clicks Abdomen:  not examined Msk:  no deformity or scoliosis noted with normal posture Extremities:  no clubbing, cyanosis, edema, or deformity noted Psych:  alert and cooperative; normal mood and affect; normal attention span and concentration   Impression & Recommendations:  Problem # 1:  COUGH  (ICD-786.2)  Improved  Orders: Est. Patient Level IV (44010) Radiology Referral (Radiology)  Problem # 2:  ALLERGIC  RHINITIS (ICD-477.9) NSWs and fluticasone spray - using as needed; may need to change to scheduled routine  Her updated medication list for this problem includes:    Fluticasone Propionate 50 Mcg/act Susp (Fluticasone propionate) .[name]... 2 sprays each nostril two times a day as needed  Problem # 3:  BRONCHIECTASIS (ICD-494.0) Repeat High Res CT scan, last one was in 12/2006;   Problem # 4:  COPD, MILD (ICD-496)  Medications Added to Medication List This Visit: 1)  Fluticasone Propionate 50 Mcg/act Susp (Fluticasone propionate) .... 2 sprays each nostril two times a day as needed 2)  Nasal Wash  .... Once daily as needed 3)  Ultracet 37.5-325 Mg Tabs (Tramadol-acetaminophen) .[name].. 1 every 6 hrs as needed for pain  Patient Instructions: 1)  Continue your nasal saline washes and fluticasone spray 2)  High Resolution CT scan of the chest 3)  Follow up with Dr Delton Coombes next available appointment after the scan

## 2010-07-03 NOTE — Op Note (Signed)
[name], [name]                ACCOUNT NO.:  [id number]   MEDICAL RECORD NO.:  [id number]          PATIENT TYPE:  AMB   LOCATION:  DSC                          FACILITY:  [facility]   PHYSICIAN:  Thornton Park. 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   PROCEDURES:  1. Right sentinel lymph node mapping and right sentinel lymph node      biopsy, using Lymphazurin blue and radioactive tracer.  2. And right breast lumpectomy.   SURGEON:  Thornton Park. Daphine Deutscher, MD   ASSISTANT:  None.   ANESTHESIA:  General by LMA.   FINDINGS:  Negative touch prep on node.  The  right lumpectomy contained  the 2 markers.  This was specimen mammogram done and sent to Dr. Anselmo Pickler.   BLOOD LOSS:  Minimal.   COMPLICATIONS:  None apparent.   DESCRIPTION OF PROCEDURE:  [name] was taken to Room #3 on March 24, 2007 and given general by LMA.  The right breast had a wire coming  out at approximately the 7 o'clock position, and also she had previously  been injected.  First I did mapping in the axilla to find the hot spot,  and marked a potential incision site there.  She was then prepped with  Techni-Care and draped sterilely.  At the site of her core biopsy I  decided to take an ellipse out in a curvilinear manner, curving around;  but that came later.   First I made incision up into the axilla.  I had injected her with about  2 mL of Lymphazurin blue.  I dissected down on following the hot counts  and then discovered a blue node that was hot.  I dissected around it and  put a right-angle clamp behind it and cut it off; and it was indeed hot  with counts over 100.  The base of this right-angle was then tied with a  suture ligature of 4-0 Vicryl.  I then mapped the  remaining axillary  contents and really found no counts other than 2-3 to 4.  This was sent  for touch preps, and Dr. Luisa Hart called back and said that they were  negative.   In the meantime, I went up in and incised in a curvilinear fashion;  taking ellipse of skin around the wire, suturing the wire to the skin  that remained.  I raised flaps medially and laterally, superiorly and  inferiorly; and took out a generous core to get around the wire.  From  the measurements it appeared that the lesion was approximately 2.5 cm  deep; and so I just got well around that widely and then deep to the  chest wall, removing in toto.  Bleeding was controlled with  electrocautery and with stitches as needed.  I injected the area with  Marcaine, as I did  the axilla.  I  then closed both with 4-0 Vicryl subcutaneously, and Benzoin and Steri-  Strips.  The patient seemed to tolerate the procedure well.  She was  taken to the recovery room in satisfactory condition.  She will be given  Tylox to take for pain.  She will be followed up in the office in about  1-2 weeks.      Thornton Park Daphine Deutscher, MD  Electronically Signed     MBM/MEDQ  D:  03/23/2007  T:  03/24/2007  Job:  [id number]   cc:   Barry Dienes. Eloise Harman, M.D.  Norva Pavlov, M.D.

## 2010-07-03 NOTE — Op Note (Signed)
[name], [name]                ACCOUNT NO.:  [id number]   MEDICAL RECORD NO.:  [id number]          PATIENT TYPE:  AMB   LOCATION:  DSC                          FACILITY:  [facility]   PHYSICIAN:  Thornton Park. 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   PROCEDURE:  Re-excision lumpectomy excising the old scar and getting the  deep margin on the 6 o'clock position down to the chest wall and marking  it on the inside with a silk suture and also taking a blind core biopsy  from the axillary margin of the cavity up toward the axilla which would  be about the 9 o'clock margin suture on the cavity side.   SURGEON:  Thornton Park. Daphine Deutscher, MD   ASSISTANT:  None.   ANESTHESIA:  General by LMA.   DESCRIPTION OF PROCEDURE:  [name] was taken to room #3 on April 07, 2007, and the right breast was prepped with Techni-Care and draped  sterilely.  The excision was described by getting a margin around the  previous skin incision and then raising flaps toward the nipple and  toward the lateral wall of the breast.  That was where the 6 o'clock  margin was and that was where I got the deep margin going down all the  way to the chest wall.  Obviously the chest wall, when I cored out to  the rim, I could not get a chest wall margin until I was on the muscle  and I went ahead and removed the specimen and marked it with silk along  the 6 o'clock margin.  I then took a separate random biopsy in the  axillary area of the cavity.  I then instilled lidocaine in the wound to  help anesthetize it.  It was then closed with interrupted 4-0 Vicryls  and staples.  The patient has pain medicine at home to take and I want  to get her back in in about six days for staple removal.      Molli Hazard B. Daphine Deutscher,  MD  Electronically Signed     MBM/MEDQ  D:  04/07/2007  T:  04/08/2007  Job:  [id number]

## 2010-07-03 NOTE — Op Note (Signed)
[name], [name] NO.:  [id number]   MEDICAL RECORD NO.:  [id number]          PATIENT TYPE:  AMB   LOCATION:  CARD                         FACILITY:  [facility]   PHYSICIAN:  Leslye Peer, MD    DATE OF BIRTH:  01-27-1941   DATE OF PROCEDURE:  02/04/2007  DATE OF DISCHARGE:                               OPERATIVE REPORT   PROCEDURE:  Fiberoptic bronchoscopy with bronchoalveolar lavage.   OPERATOR:  Leslye Peer, MD.   INDICATION:  Bronchiectasis with history of MAI colonization.   The medications given include  1. Fentanyl 100 mcg IV in divided doses.  2. Versed 2 mg IV in divided doses.  3. Phenergan 12.5 mg times one.  4. Lidocaine 1% to the bronchoalveolar tree 20 mL total.   CONSENT:  Informed consent was obtained from the patient.  A signed copy  is on her hospital chart.   PROCEDURE DETAILS:  After consent was obtained as indicated above,  conscious sedation was initiated as indicated above.  She also received  Phenergan 12.5 mg pre procedure for dissipated nausea with narcotics.  She did not experience nausea during the case.  The fiberoptic  bronchoscope was introduced through the left naris without difficulty.  The posterior pharynx had significant redundant tissue and a large  epiglottis.  After the epiglottis was passed, the glottis and cords were  normal in appearance.  The cords moved normally with phonation.  The  trachea was intubated.  Local anesthesia was achieved with 1% lidocaine  to the bronchoalveolar tree.  The trachea and bilateral mainstem bronchi  were normal in appearance.  Distal airways were all inspected including  the left upper lobe lingular and left lower lobe airways.  There were no  significant abnormalities.  There was some slight erythema present.  No  bronchial lesions or secretions were noted.  Attention was then turned  to the right-sided exam.  The right upper lobe bronchi were within  normal limits.  The right  lower lobe airways were normal in appearance.  There was an anomalous takeoff of the posterior segment of the right  lower lobe which is a normal variant.  The right middle lobe was also  normal in appearance.  There were no significant endobronchial lesions  or secretions throughout the exam.  Bronchoalveolar lavage was performed  of the right middle lobe with 60 mL of normal saline instilled and 18 mL  returned.  This will be sent for culture.  There was some mild bleeding  from the right middle lobe likely secondary to scope trauma after the  BAL.  There was good hemostasis by the end of the procedure.  Estimated  blood loss was approximately 1 to 2 mL. The patient tolerated procedure  very well.  She returned to the recovery room in good condition.  There  were no obvious complications.   SAMPLES:  Right middle lobe bronchoalveolar lavage which will be sent  for bacterial, fungal and AFB smears and cultures as well as cytology.  Neck.   PLANS:  I will follow-up with [name]  as an outpatient regarding her  mycobacterial smear and culture.      Leslye Peer, MD  Electronically Signed     RSB/MEDQ  D:  02/04/2007  T:  02/04/2007  Job:  [id number]   cc:   Barry Dienes. Eloise Harman, M.D.  Fax: [phone]

## 2010-07-03 NOTE — Assessment & Plan Note (Signed)
[facility]                             PULMONARY OFFICE NOTE   NAME:GRIFFINKeniya, [name]                       MRN:          [id number]  DATE:08/28/2006                            DOB:          1940-09-08    SUBJECTIVE:  [name] is a 70 year old woman with a history of  chronic cough, bronchiectasis and mycobacterium avium intracellulare  complex infection.  She has had waxing and waning nodular disease on CT  scan of the chest and 6 months ago we started her on ethambutol,  Rifampin and clarithromycin.  She has been tolerating the medications  well after some initial GI upset.  She states that she has been more  short of breath with exertion recently, including climbing stairs.  She  has been tried on Advair in the past given air flow limitation on her  pulmonary function testing.  She does not believe that this has been  helpful.   CURRENT MEDICATIONS:  1. Topamax 200 mg daily.  2. Atenolol 50 mg daily.  3. Clarithromycin.  4. Ethambutol.  5. Rifampin.   EXAM:  GENERAL:  This is a pleasant well-appearing woman in no distress  on room air.  Her weight is 140 pounds, down 7 pounds from our last visit.  Temperature 97.7.  Blood pressure 128/74, heart rate 74, SPO2 100% on  room air.  HEENT:  Oropharynx is clear.  NECK:  No stridor or lymphadenopathy.  LUNGS:  Clear to auscultation bilaterally. She has no wheezing on forced  expiration.  HEART:  Regular without murmur.  ABDOMEN:  Soft, nontender with positive bowel sounds.  EXTREMITIES:  No cyanosis, clubbing or edema.   IMPRESSION:  1. 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  2. 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 Flo Shanks.  3. Migraine headaches.    PLAN:  1. She will continue her ethambutol, Rifampin, and clarithromycin at      this time.  2. I will repeat her CT scan of the chest to look for interval change      in her nodular disease and bronchiectasis.  3. I will initiate a trial of albuterol 2 puffs q. 4 hours p.r.n. for      shortness of breath to see if this helps with her air flow      limitation and her exertional dyspnea.  4. It may be necessary to treat [name] for an extended period of      time in order to adequately eradicate her MAC colonization.  She      has been treated for up to a year in the past.  Unfortunately,      therapy has been unsuccessful.  5. I will follow up with [name] in 1 month or sooner should she      have any difficulty in the interim.  At that time  I will review her      CT scan of the chest.     Leslye Peer, MD  Electronically Signed    RSB/MedQ  DD: 08/28/2006  DT: 08/29/2006  Job #: [id number]   cc:   Zola Button T. Lazarus Salines, M.D.  Reuel Boom, M.D. Jarold Motto

## 2010-07-03 NOTE — Assessment & Plan Note (Signed)
 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  She is now being treated for her MAC  colonization with Fembutal, rifampin, and clarithromycin for the last 7-  and-a-half months.  She tells me that she has had no significant changes  in her symptoms.  We started her on albuterol metered dose inhaler at  her last visit because she was having more difficulty climbing stairs  and exerting herself.  A CT scan of her chest was performed on July 11,  and she is here to review the results of this today.  The study shows no  interval change in her bronchiectasis in her lingula and right middle  lobe compared with July of 2007.  There were scattered nodular densities  that were also unchanged.  She tells me that she is going to have Botox  injections performed because of her migraine headaches.   MEDICATIONS:  1. Topamax 200 mg daily.  2. Atenolol 50 mg daily.  3. Clarithromycin b.i.d.  4. Fembutal daily.  5. Rifampin daily.  6. Imitrex p.r.n.  7. Ibuprofen p.r.n.  8. Allegra p.r.n.  9. Nasonex p.r.n.   EXAM:  GENERAL:  This is a pleasant, well-appearing woman in no  distress.  HEENT:  Benign.  VITAL SIGNS:  Weight 140 pounds, blood pressure 112/70, temperature  96.6, heart rate 72, SpO2 96% on room air.  LUNGS:  Clear to auscultation bilaterally.  HEART:  Regular without murmur.  ABDOMEN:  Benign.  EXTREMITIES:  No cyanosis, clubbing, or edema.   IMPRESSION:  1. Bronchiectasis with mycobacterium avium-intracellulare colonization      now status post 7-and-a-half months of clarithromycin, Fembutal,      and  rifampin.  There has been no interval change in her      bronchiectasis or her nodular disease.  A CT scan of the chest as      detailed above.  2. Migraine headaches.   PLAN:  I will continue her on Fembutal, rifampin, and clarithromycin  probably for 12 months total.  At that time, we will repeat her sputum  for AFB to see if we have eradicated her MAC.  I gave her pulmonary  clearance to have her Botox injections performed.     Leslye Peer, MD  Electronically Signed    RSB/MedQ  DD: 11/10/2006  DT: 11/11/2006  Job #: [id number]

## 2010-07-06 NOTE — H&P (Signed)
[name], [name] NO.:  [id number]   MEDICAL RECORD NO.:  [id number]          PATIENT TYPE:  OBV   LOCATION:  0455                         FACILITY:  [facility]   PHYSICIAN:  Barry Dienes. Eloise Harman, M.D.DATE OF BIRTH:  1940-03-26   DATE OF ADMISSION:  05/08/2004  DATE OF DISCHARGE:                                HISTORY & PHYSICAL   CHIEF COMPLAINT:  Fatigue, nausea, and diarrhea.   HISTORY OF PRESENT ILLNESS:  The patient is a 70 year old white female, who  is well known to me.  At approximately 0300 hours on March 20, she awoke  with nausea, vomiting, and diarrhea.  There were multiple episodes of  vomiting and diarrhea.  The vomited material was green, and her vomiting  continued until approximately 10:00 on March 20.  The diarrhea, however,  persisted until presentation to the emergency room today.  The diarrhea has  been watery brown in consistency and color.  She has had 4-6 large bowel  movements daily and has had little food and fluid intake over the past 2  days.  In the emergency room today, she was noted to be markedly orthostatic  despite high volume IV fluids, so she was admitted for further evaluation.  Since starting regular dosing of Lomotil, her diarrhea has stopped.  She  continues to have mild nausea.  She was able to eat some clear liquids at  dinner.  She denied recent fever, chills, or ill contacts.  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  She had fried chicken from a restaurant on March 19.  No other family members that had some of this food have come down with  diarrhea.   PAST MEDICAL HISTORY:  1.  Migraine headaches.  2.  Hyperlipidemia.  3.  Chronic fatigue with a with including a 2002 Cardiolite stress test      showing a normal ejection fraction with no ischemia, normal TSH testing,      and blood counts in August 2005, and a normal September 2005 sleep  study.  4.  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  5.  In May 2005, she was diagnosed with MAI pneumonitis.  At that time, an      HIV test was normal.  6.  She has also had problems with bilateral shoulder rotator cuff      tendonitis and has had cortisone shots in both shoulders.  7.  She has osteopenia by 2003 bone mineral density tests, and she has had      problems with bilateral plantar fasciitis.   MEDICATIONS PRIOR TO ADMISSION:  1.  Atenolol 100 mg p.o. daily.  2.  Flonase 1-2 sprays each nostril once daily.  3.  Midrin p.r.n. for headaches.  4.  Imitrex p.r.n. for headaches.  5.  Topamax 200 mg p.o. q.h.s.  6.  Rifampin 600 mg p.o. daily.  7.  __________ 1000 mg p.o. daily.  8.  Biaxin 500  mg p.o. b.i.d.   PAST SURGICAL HISTORY:  1.  Remote tonsillectomy and adenoidectomy.  2.  In the 1970s, total vaginal hysterectomy.  3.  Left myringotomy, tympanoplasty, and mastoidectomy.  4.  October 2003, laparoscopic cholecystectomy.  5.  November 2005, cataract operation.   ALLERGIES:  1.  CODEINE.  2.  BARBITURATES.  3.  DROPERIDOL.  4.  PRAVACHOL.  5.  DEMEROL.  6.  PREDNISONE.   FAMILY HISTORY:  Her father died at age 76 of bone cancer.  Her mother has  Parkinson's disease.  She has two children.  There is no family history of  diabetes mellitus, premature coronary artery disease, colon cancer, or  breast cancer.   SOCIAL HISTORY:  She is married and is retired.  She has no history of  tobacco or alcohol abuse.   REVIEW OF SYSTEMS:  She has chronic dry cough and chronic mild dyspnea on  exertion and fatigue.  She denied recent fever, chills, chest pain, dysuria,  anxiety, or depression.   PHYSICAL EXAMINATION:  VITAL SIGNS:  Blood pressure 96/43 with pulse 72,  respirations 20, temperature 97.8, 135 pounds, pulse oxygen saturation 98%  on room air.  GENERAL:  She is a thin  white female, who is in no apparent distress while  sitting at 30-degree elevation of the head of the bed.  She looked somewhat  fatigued, and her eyes had a sunken appearance consistent with dehydration.  HEENT:  Otherwise within normal limits.  NECK:  Supple without jugular venous distention or carotid bruit.  CHEST:  Clear to auscultation.  HEART:  Regular rate and rhythm.  S1 and S2 were present without murmur,  gallop, or rub.  ABDOMEN:  Normal bowel sounds with no hepatosplenomegaly or tenderness.  EXTREMITIES:  Without cyanosis, clubbing, or edema.  NEUROLOGIC:  She is alert and oriented x 3.  There were no focal neurologic  deficits.   LABORATORY STUDIES:  White blood cell count 2.9, hemoglobin 12, hematocrit  34, 79% neutrophils, platelets 129, serum sodium 139, potassium 3.0,  chloride 111, carbon dioxide 21, BUN 14, creatinine 0.9, glucose 101.  Urinalysis was normal.  Acute abdomen series x-rays showed chest without  acute cardiopulmonary disease and a normal abdomen.  A stool specimen for C.  difficile was pending.   IMPRESSION AND PLAN:  1.  Nausea and diarrhea:  This is likely secondary to viral gastroenteritis      given the rapidity of onset without fever of leukocytosis.  C. difficile      colitis is also possible given her prolonged antibiotic treatment.  I      plan on checking a stool specimen for C. difficile and continuing IV      fluids at a high rate, as she appears very dehydrated.  2.  Hypotension:  Secondary to decreased intravascular fluid volume with      ongoing beta blocker treatment.  Her beta blocker will be stopped, and      we will reassess her blood pressure after several more liters of IV      fluids.  3.  Mycobacterium avium-intracellulare pneumonitis:  Stable on her current      medical regimen which is planned      for a 12-18 month course of treatment. 4.  Hypokalemia:  This is due to her severe diarrhea and dehydration.  She      will be  receiving a moderate dose of potassium in her IV fluids this  evening.      DGP/MEDQ  D:  05/08/2004  T:  05/08/2004  Job:  [id number]   cc:   Oley Balm. Sung Amabile, M.D. [facility]   Norval Gable. [place], M.D.  [address] [place]  [place] [address]  Fax: [phone]

## 2010-07-06 NOTE — Op Note (Signed)
NAME:  [name], [name]                          ACCOUNT NO.:  [id number]   MEDICAL RECORD NO.:  [id number]                   PATIENT TYPE:  AMB   LOCATION:  DSC                                  FACILITY:  [facility]   PHYSICIAN:  Thornton Park. Daphine Deutscher, M.D.             DATE OF BIRTH:  09/03/1940   DATE OF PROCEDURE:  01/25/2003  DATE OF DISCHARGE:                                 OPERATIVE REPORT   PREOPERATIVE DIAGNOSIS:  Thickened density in right lower quadrant.   POSTOPERATIVE DIAGNOSIS:  Thickened density in right lower quadrant.   PROCEDURE:  Incisional biopsy of thickened density in right lower quadrant.   SURGEON:  Thornton Park. Daphine Deutscher, M.D.   DESCRIPTION OF PROCEDURE:  Ms. Kaneko had the area localized and was  marked.  I then prepped with Betadine and infiltrated the area with  lidocaine and excised an ellipse of tissue including a previous trocar site  from a previous laparoscopic cholecystectomy and carried this down pretty  much to the fascia.  I went and got full thickness skin and subcutaneous  tissue and fat and then when I completed this, I went a little bit deeper  laterally and took some more of this tissue that felt a little bit hard.  I  then cauterized with electrocautery and closed with 4-0 Vicryl with Benzoin  and Steri-Strips.  The patient seemed to tolerate the procedure well and was  taken to the recovery room in satisfactory condition.  Pathology report  pending.                                               Thornton Park Daphine Deutscher, M.D.    MBM/MEDQ  D:  01/25/2003  T:  01/26/2003  Job:  [id number]   cc:   Barry Dienes. Eloise Harman, M.D.  [address]  [place]  [place] [address]  Fax: [phone]   Malachi Pro. Ambrose Mantle, M.D.  [address]  [address]  [place]  [place] [address]  Fax: [phone]

## 2010-07-06 NOTE — Assessment & Plan Note (Signed)
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  Antibiotic sensitivities have been obtained for her isolate, and she was  started in November on Ethambutol, Rifampin, and Clarithromycin. She  follows up today to assess her progress on these medications. Her cough  is for the most part unchanged. She has had some significant problems  with nausea and diarrhea since she began the regimen. This is improving  somewhat but she still has nausea throughout the day. She is still very  willing to go through with the therapy in hopes of eradicating her  mycobacterial disease. She denies any significant shortness of breath.  She has some daily fatigue.   MEDICATIONS:  1. Topamax 200 mg daily.  2. Atenolol 50 mg daily.  3. Singulair 10 mg daily.  4. Clarithromycin.  5. Ethambutol.  6. Rifampin.  7. Imitrex p.r.n.  8. Ibuprofen p.r.n.  9. Allegra p.r.n.  10.Nasonex.   OBJECTIVE:  GENERAL:  This is a well appearing, comfortable woman on  room air.  VITAL SIGNS:  Weight 146 pounds. Temperature 97.7. Blood pressure  110/70. Heart rate 84. Saturation 100% on room air.  HEENT:  Oropharynx clear.  NECK:  No stridor.  LUNGS:  Have a few coarse breath sounds. There is no wheezing.  HEART:  Regular rate and rhythm.  ABDOMEN:  Benign.  EXTREMITIES:  No clubbing, cyanosis, or edema.   IMPRESSION:  1. Bronchiectasis with MAC colonization.  2. History of MRSA, left inner ear infection, currently being treated      by Dr. Flo Shanks.   PLAN:  1. She will continue her Ethambutol, Rifampin, and Clarithromycin as      ordered.  2. We will repeat her CT scan of  the chest and her sputum for AFB in 6      months to look for      eradication and also for improvement in her nodular disease. She      will call me in the interim and we will schedule followup before      that date if she has any trouble.     Leslye Peer, MD  Electronically Signed    RSB/MedQ  DD: 02/07/2006  DT: 02/08/2006  Job #: [id number]   cc:   Gloris Manchester. Lazarus Salines, M.D.  Barry Dienes Eloise Harman, M.D.

## 2010-07-06 NOTE — Assessment & Plan Note (Signed)
[name]                               PULMONARY OFFICE NOTE   NAME:[name], [name]                       MRN:          [id number]  DATE:09/18/2005                            DOB:          1940/02/22    SUBJECTIVE:  [name] is a 70 year old woman with a history of  bronchiectasis and MAC infection that failed eradication after a year of  therapy.  She also has chronic cough which is likely also influenced by  significant postnasal drip.  She was last seen on September 03, 2005.  At that  time she was having significant cough that had been treated with  ciprofloxacin with resolution of her purulent yellow and greenish sputum.  Her sputum was clear.  At that visit she complained of significant fatigue  and some increased dyspnea.  She returns today after a high-resolution CT  scan has been performed to look for interval change in her bronchiectasis  and Mycobacterium avium.  She continues to be a bit lethargic, but this is  not as severe as at our last visit.  She is not back to her usual baseline.  She does have a daily dry cough that is not productive and is changed in  character compared with her previous cough that was treated successfully  with ciprofloxacin.  She states that she is doing a lot of throat clearing  and has significant postnasal drip.  Her CT scan is detailed below.   CURRENT MEDICATIONS:  1. Topamax 200 mg daily.  2. Atenolol 50 mg daily.  3. Singulair 10 mg daily.  4. Allegra 180 mg daily.  5. Nasonex 2 sprays to each nostril b.i.d.  6. Imitrex p.r.n.  7. Ibuprofen p.r.n.   EXAMINATION:  GENERAL:  This is a well-appearing thin woman who is in no  distress on room air.  Her weight is 143 pounds.  Temperature 97.5, blood  pressure 112/70, heart rate 68, SpO2 98% on room air.  HEENT:  Benign.  LUNGS:  Clear to auscultation bilaterally.  HEART:  Regular rate and rhythm without murmur.  ABDOMEN:  Soft, nontender, positive  bowel sounds.  EXTREMITIES:  No cyanosis, clubbing or edema.   LABORATORY EVALUATION:  We contacted the microbiology lab at [facility] and  discovered that her MAC isolate has not yet been sent to [facility] for  sensitivities because it has not grown to maturity.  They estimated it would  be sent for sensitivity testing after approximately one week.  Her CT scan  of the chest was performed on September 06, 2005 and is compared to her prior  film from January 01, 2005.  This showed significant bronchiectasis,  bronchiolectasis and scattered small nodules consistent with her MAC.  The  overall burden of pulmonary disease was not appreciably changed, but there  was a focal cluster of airspace nodules in the right upper lobe that was  slightly more prominent than on her previous film.  Also noted was stable  minimal nodularity of the left adrenal gland and lobulated right liver mass  that were unchanged.   IMPRESSION:  1. Bronchiectasis with Mycobacterium avium complex infection and evidence      of some new areas of active disease on her CT scan without any large      scale changes in her disease burden.  We will plan to retry eradication      of her Mycobacterium avium complex, but I will defer starting a regimen      until the sensitivity data is available from [place] to guide our      therapy.  I will call her when the sensitivities are available.  She      will contact me and will follow up earlier if she clinically worsens.  2. Postnasal drip and chronic cough.  We will continue her same regimen of      Allegra and Nasonex, and she will add daily nasal saline washes to try      and improve her postnasal drip and hopefully her dry cough.                                   Leslye Peer, MD   RSB/MedQ  DD:  09/26/2005  DT:  09/26/2005  Job #:  [id number]

## 2010-07-06 NOTE — Op Note (Signed)
NAME:  NEALY, HICKMON                          ACCOUNT NO.:  [id number]   MEDICAL RECORD NO.:  [id number]                   PATIENT TYPE:  INP   LOCATION:  2861                                 FACILITY:  [facility]   PHYSICIAN:  Thornton Park. Daphine Deutscher, M.D.             DATE OF BIRTH:  September 24, 1940   DATE OF PROCEDURE:  11/18/2001  DATE OF DISCHARGE:                                 OPERATIVE REPORT   CCS#  [id number]   PREOPERATIVE DIAGNOSES:  Chronic cholecystitis, cholelithiasis.   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  Small right  inguinal hernia.   OPERATION PERFORMED:   SURGEON:  Matthew B. Daphine Deutscher, M.D.   ASSISTANT:  Anselm Pancoast. Zachery Dakins, M.D.   ANESTHESIA:  General.   DESCRIPTION OF PROCEDURE:  The patient was taken to room 17 and given  general anesthesia.  The abdomen was prepped with Betadine and draped  sterilely.  I excised her old scar in her umbilicus and went through a small  incision longitudinally through a pursestring suture.  Three ports were  placed in the upper abdomen after injecting these areas with lidocaine and  the gallbladder was grasped.  She had a lot of chronic adhesions to the  fundus and neck of the gallbladder which were taken down with sharp  dissection using scissors.  The neck was then exposed.  I carefully  dissected out a fairly short cystic duct.  A clip was placed up on the  gallbladder and I incised the cystic duct and then opened this and milked it  back but did not find any stones.   A dynamic cholangiogram was obtained using C-arm and Reddick catheter which  showed filling of the common duct with free flow into the duodenum.  The  cystic duct was then triple clipped and divided.  The cystic artery was  double clipped and divided and the gallbladder was removed from the  gallbladder bed with a hook using the electrocautery without entering the  gallbladder.   No bleeding or bile leaks were noted at the completion of  detachment.  I removed the gallbladder and brought it out through the  umbilicus without difficulty.  Observing the abdomen, there was a small  right inguinal hernia noted which was pictured in the chart along with the  hemangioma  of the liver which has been worked up before, but it too is depicted in the  chart.  The patient seemed to tolerate the procedure well and was taken to  the recovery room following closure of the incisions with 4-0 Vicryl,  benzoin and Steri-Strips.  Thornton Park Daphine Deutscher, M.D.    MBM/MEDQ  D:  11/18/2001  T:  11/18/2001  Job:  [id number]   cc:   Barry Dienes. Eloise Harman, M.D.

## 2010-07-06 NOTE — Consult Note (Signed)
NAME:  [name], [name]                          ACCOUNT NO.:  [id number]   MEDICAL RECORD NO.:  [id number]                   PATIENT TYPE:   LOCATION:                                       FACILITY:  [facility]   PHYSICIAN:  Abigail Miyamoto, M.D.              DATE OF BIRTH:   DATE OF CONSULTATION:  03/30/2002  DATE OF DISCHARGE:                                   CONSULTATION   CHIEF COMPLAINT:  Abdominal pain.   HISTORY OF PRESENT ILLNESS:  [name] is a 70 year old female patient of  Dr. Ivery Quale, who developed right lower quadrant abdominal pain  which she described as a 10 out of 10 and sharp, approximately 3-4 days ago.  The pain has slowly decreased over the last several days and now, is almost  non-existent. She did have some nausea with this but was moving her bowels  reasonably well. She reports that the pain subsided when she would lay  still. She had no chest pain or shortness of breath. She reports some fever  apparently. She was seen by Dr. Jarold Motto and has had a normal white count  and normal urinalysis. She is set for CT scan of the abdomen and pelvis. The  CT scan was done on March 29, 2002 and suggested possible early  appendicitis. Our office of [facility] was called today and  consultation was requested. Again, the patient now reports that she feels  much better. She is generally almost pain free.   PAST MEDICAL HISTORY:  Significant for migraine headaches.   PAST SURGICAL HISTORY:  Includes cholecystectomy, hysterectomy, nose  surgery, partial mastectomy, and aspiration of breast cyst.   MEDICATIONS:  Include Protonix, Topamax, and Atenolol.   ALLERGIES:  BARBITURATES and a list of other medications which I will not  list.   SOCIAL HISTORY:  Does not smoke and does not drink alcohol.   REVIEW OF SYSTEMS:  Normal from a cardiopulmonary standpoint without chest  pain or shortness of breath. Constitutional unremarkable. There is no  neurological or psychiatric problems.   PHYSICAL EXAMINATION:  GENERAL: A well developed, well nourished, female who  is no acute distress and not ill in appearance.  VITAL SIGNS: Heart rate 85, blood pressure 117/54, respiratory rate 20,  temperature 97.3.  HEENT: Nonicteric. Oropharynx clear.  NECK: Supple.  LUNGS: Clear to auscultation and percussion bilaterally.  CARDIAC: Regular rate and rhythm.  ABDOMEN: Soft. There is minimal tenderness in the right lower quadrant.  There are no masses. There is no guarding. The rest of the abdomen is soft.  EXTREMITIES: Warm and well perfused. No edema, clubbing or cyanosis.   LABORATORY DATA:  WBC count of 3.1, hemoglobin and hematocrit 11.6 and 32.5.  Platelets 192,000. White count shows no left shift with neutrophils being  71%. Her electrolytes are normal and BUN and creatinine are 12 and 0.8.   DIAGNOSTIC IMPRESSION:  There  is a CT scan for carotid imaging which I  reviewed as well as two radiologists from [facility] and there is a  normal appendix. Appendiceal information on the abdomen is otherwise  unremarkable except for diverticulitis.   ASSESSMENT/PLAN:  This is a 70 year old female with abdominal pain of  uncertain etiology. This may represent some kind of ruptured cyst or a mild  diverticulitis but there is no evidence of appendicitis or any kind of intra-  abdominal inflammation at this point. Given her examination and WBC count,  there is nothing more to offer from a surgical standpoint, other than  expected management. If she again develops this abdominal pain, she will  call us as soon as possible at [facility].                                               Abigail Miyamoto, M.D.    DB/MEDQ  D:  03/30/2002  T:  03/30/2002  Job:  [id number]   cc:   Vania Rea. Jarold Motto, M.D. [facility]  [address]  [place]  [place] [address]  Fax: [phone]

## 2010-07-06 NOTE — Discharge Summary (Signed)
[name], [name] NO.:  [id number]   MEDICAL RECORD NO.:  [id number]          PATIENT TYPE:  OBV   LOCATION:  0455                         FACILITY:  [facility]   PHYSICIAN:  Barry Dienes. Eloise Harman, M.D.DATE OF BIRTH:  Apr 21, 1940   DATE OF ADMISSION:  05/08/2004  DATE OF DISCHARGE:  05/09/2004                                 DISCHARGE SUMMARY   PERTINENT FINDINGS:  The patient is a 70 year old white female who had a two-  day history of nausea with vomiting and severe diarrhea.  She presented to  the emergency room due to marked fatigue and dizziness.  In the emergency  room, she was noted to be markedly tilt positive, even after several liters  of IV fluids, so she was admitted for continued hydration and stabilization.  Of note, she has been on long-term antibiotic treatment for Mycobacterium  avium intracellulare pneumonitis.  She also had restaurant fried chicken on  May 06, 2004.  However, there were no other family members who had this  food and had similar symptoms.   See admission history and physical for past medical history and medications  prior to admission.   INITIAL PHYSICAL EXAMINATION:  BLOOD PRESSURE:  96/43.  PULSE:  72.  RESPIRATIONS:  20.  TEMPERATURE:  97.8.  WEIGHT:  135 pounds.  GENERAL:  She is a thin white female who was in no apparent distress.  She  looked quite fatigued.  HEENT:  Within normal limits.  NECK:  Without jugular venous distention or carotid bruit.  CHEST:  Clear to auscultation.  HEART:  Had a regular rate and rhythm.  S1 and S2 are present, without  murmur, gallop, or rub.  ABDOMEN:  Had normal bowel sounds, with no hepatosplenomegaly or tenderness.  EXTREMITIES:  Without cyanosis, clubbing, or edema.  NEUROLOGIC:  Nonfocal.   LABORATORY STUDIES:  White blood cell count 2.9, hemoglobin 12, hematocrit  34, platelets 129, 79% neutrophils.  Serum sodium 139, potassium 3, chloride  111, CO2 21, BUN 14, creatinine 0.9,  glucose 101.  Urinalysis normal.  Abdomen x-ray series normal.   HOSPITAL COURSE:  The patient was admitted to a medical bed without  telemetry.  She was given high-volume IV fluids totalling approximately 9  liters.  She was also treated with Imodium p.r.n.  A stool specimen was sent  for Clostridium difficile testing, with results pending at the time of  dictation.  She had no further episodes of diarrhea.  Her nausea improved  significantly.  She was able to tolerate a light supper without difficulty.   PROCEDURES:  None.   COMPLICATIONS:  None.   CONDITION ON DISCHARGE:  She has a mild bifrontal headache, which is common  for her.  She has had no further diarrhea and no nausea or dizziness.  Her  current vital signs include a blood pressure of 118/53; pulse 72;  respirations 20; temperature 79.9; pulse oxygen saturation 100% on room air.  Most recent laboratory tests include a white blood cell count of 3,  hemoglobin 10, hematocrit 31, platelets 116.  Serum sodium 140, potassium 4,  chloride 114, CO2 23, BUN 6, creatinine 0.8, serum albumin 2.6.  Her chest  remains clear to auscultation.  Heart has a regular rate and rhythm.  Abdomen exam is benign.  Her neurological exam is nonfocal.  She is able to  change from a sitting position to standing and walk in her room with no  dizziness.   DISCHARGE DIAGNOSES:  1.  Severe viral gastroenteritis.  2.  Migraine headaches.  3.  Mycobacterium avium intracellulare pneumonitis.  4.  Malnutrition.  5.  Pancytopenia.  6.  Localized scleroderma.  7.  Osteopenia.  8.  History of plantar fasciitis.  9.  Chronic fatigue.  10. Chronic dyspnea.   DISCHARGE MEDICATIONS:  1.  Atenolol 50 mg daily, to be restarted in five days from discharge.  2.  Flonase 1-2 sprays each nostril daily as needed for nasal congestion.  3.  Midrin 2 caps daily as needed for mild headaches.  4.  Imitrex 100 mg p.o. daily as needed for severe headaches.  5.   Topamax 200 mg p.o. at bedtime.  6.  Rifampin 600 mg p.o. daily.  7.  Ethambutol 1,000 mg p.o. daily.  8.  Biaxin 500 mg p.o. b.i.d.  9.  Imodium D 2 tabs p.o. q.i.d. p.r.n. diarrhea without fever.  10. Flora Q probiotic 1 capsule daily.   SPECIAL INSTRUCTIONS:  She was advised to call Dr. Eloise Harman if her nausea,  vomiting, or diarrhea recurs.   FOLLOW-UP PLANS:  She will be seen within one week of discharge by Dr.  Eloise Harman at [facility], and she was given a telephone  number to call to schedule that visit.      DGP/MEDQ  D:  05/09/2004  T:  05/09/2004  Job:  [id number]   cc:   Oley Balm. Sung Amabile, M.D. [facility]   Norval Gable. [place], M.D.  [address] [place]  [place] [address]  Fax: [phone]

## 2010-07-06 NOTE — Procedures (Signed)
[facility]  Patient:    [name], [name] Visit Number: [id number] MRN: [id number]          Service Type: END Location: ENDO Attending Physician:  Mervin Hack Dictated by:   Hedwig Morton. Juanda Chance, M.D. [facility] Admit Date:  01/12/2001   CC:         Barry Dienes. Eloise Harman, M.D.  Malachi Pro. Ambrose Mantle, M.D.  Duncan Dull, M.D.   Procedure Report  PROCEDURE:  Colonoscopy.  INDICATIONS:  This 70 year old white female is undergoing her first colonoscopy for neoplastic screening.  She had a flexible sigmoidoscopy approximately 30 years ago.  Her stool has been Hemoccult negative.  ENDOSCOPE:  Olympus single-channel video endoscope.  SEDATION:  Versed 9 mg IV and Demerol 50 mg IV.  FINDINGS:  The Olympus single-channel video endoscope was passed under direct vision through the rectum to the sigmoid colon.  The patient was monitored by pulse oximetry.  Oxygen saturations were at one to 82%, but responded to increase nasal oxygen and stimulation.  The anal canal was normal. Retroflexion of the endoscope in the rectum did now show any internal hemorrhoids.  The sigmoid colon had multiple diverticuli.  Some of them were rather wide mouth.  There was no significant narrowing of the colon.  The splenic flexure, transverse colon, and hepatic flexure were normal with normal-appearing right colon all the way to the cecum.  The cecal pouch and ileocecal valve were unremarkable.  The colonoscope was then retracted and the colon was decompressed.  The patient tolerated the procedure well.  IMPRESSION:  Moderately severe diverticulosis of the left colon.  PLAN: 1. A high-fiber diet. 2. Repeat colonoscopy in 10 years. 3. Yearly hemoccults. Dictated by:   Hedwig Morton. Juanda Chance, M.D. [facility] Attending Physician:  Mervin Hack DD:  01/12/01 TD:  01/12/01 Job: [id number]

## 2010-07-24 ENCOUNTER — Emergency Department (HOSPITAL_COMMUNITY)
Admission: EM | Admit: 2010-07-24 | Discharge: 2010-07-24 | Disposition: A | Discharge status: Home / Self Care | Payer: Medicare Other | Attending: Emergency Medicine | Admitting: Emergency Medicine

## 2010-07-24 ENCOUNTER — Emergency Department (HOSPITAL_COMMUNITY): Payer: Medicare Other

## 2010-07-24 DIAGNOSIS — M79609 Pain in unspecified limb: Secondary | ICD-10-CM | POA: Insufficient documentation

## 2010-07-24 DIAGNOSIS — W540XXA Bitten by dog, initial encounter: Secondary | ICD-10-CM | POA: Insufficient documentation

## 2010-07-24 DIAGNOSIS — Z23 Encounter for immunization: Secondary | ICD-10-CM | POA: Insufficient documentation

## 2010-07-24 DIAGNOSIS — S61409A Unspecified open wound of unspecified hand, initial encounter: Secondary | ICD-10-CM | POA: Insufficient documentation

## 2010-11-09 LAB — BASIC METABOLIC PANEL
Calcium: 9.3
Calcium: 9
Creatinine, Ser: 0.79
GFR calc Af Amer: >60
GFR calc non Af Amer: >60
GFR calc non Af Amer: >60
Potassium: 3.3 — ABNORMAL LOW
Sodium: 143
Sodium: 139

## 2010-11-12 LAB — POCT HEMOGLOBIN-HEMACUE: Hemoglobin: 14.2

## 2010-11-23 LAB — CULTURE, RESPIRATORY W GRAM STAIN: Gram Stain: NONE SEEN

## 2010-11-23 LAB — AFB CULTURE WITH SMEAR (NOT AT ARMC): Acid Fast Smear: NONE SEEN

## 2010-11-23 LAB — FUNGUS CULTURE W SMEAR

## 2010-11-26 LAB — AFB CULTURE WITH SMEAR (NOT AT ARMC)

## 2010-11-26 LAB — FUNGUS CULTURE W SMEAR

## 2011-02-21 ENCOUNTER — Encounter: Payer: Self-pay | Admitting: Internal Medicine

## 2011-04-30 ENCOUNTER — Encounter (INDEPENDENT_AMBULATORY_CARE_PROVIDER_SITE_OTHER): Payer: Self-pay

## 2011-08-13 ENCOUNTER — Encounter: Payer: Self-pay | Admitting: *Deleted

## 2011-08-19 ENCOUNTER — Encounter: Payer: Self-pay | Admitting: Internal Medicine

## 2011-08-30 ENCOUNTER — Encounter: Payer: Self-pay | Admitting: Internal Medicine

## 2011-08-30 ENCOUNTER — Ambulatory Visit (INDEPENDENT_AMBULATORY_CARE_PROVIDER_SITE_OTHER): Payer: BC Managed Care – PPO | Admitting: Internal Medicine

## 2011-08-30 VITALS — BP 128/64 | HR 62 | Ht 67.0 in | Wt 132.0 lb

## 2011-08-30 DIAGNOSIS — K5732 Diverticulitis of large intestine without perforation or abscess without bleeding: Secondary | ICD-10-CM

## 2011-08-30 DIAGNOSIS — K5792 Diverticulitis of intestine, part unspecified, without perforation or abscess without bleeding: Secondary | ICD-10-CM

## 2011-08-30 DIAGNOSIS — R933 Abnormal findings on diagnostic imaging of other parts of digestive tract: Secondary | ICD-10-CM

## 2011-08-30 MED ORDER — MOVIPREP 100 G PO SOLR: ORAL | Status: DC

## 2011-08-30 NOTE — Patient Instructions (Addendum)
You have been scheduled for a colonoscopy with propofol. Please follow written instructions given to you at your visit today.  Please pick up your prep kit at the pharmacy within the next 1-3 days. If you use inhalers (even only as needed), please bring them with you on the day of your procedure. CC: Dr Jarome Matin

## 2011-08-30 NOTE — Progress Notes (Signed)
[name] May 11, 1940 MRN [id number]   History of Present Illness:  This is a 71 year old white female with an episode of diverticulitis which diagnosed in April 2013 after she developed LLQ abd. pain .She responded to an oral antibiotic regimen of Cipro and Flagyl.  CT scan showed sigmoid diverticulosis with a thickening of the wall and stranding around the sigmoid colon consistent with mild diverticulitis. She also had a right lobe of the liver hemangioma and mild biliary dilation consistent with normal postcholecystectomy state.   Past Medical History  Diagnosis Date  . Bronchiectasis   . Migraine   . Diverticulosis   . Osteopenia   . Scleroderma, localized   . Plantar fasciitis   . Dilation of biliary tract   . Ovarian cyst   . Lumbar disc herniation   . Vitamin B12 deficiency   . Breast cancer, right    Past Surgical History  Procedure Date  . Total vaginal hysterectomy   . Myringotomy     with tympanoplasty and mastoidectomy  . Laparoscopic cholecystectomy   . Cataract extraction     right  . Breast lumpectomy     right    reports that she has never smoked. She has never used smokeless tobacco. She reports that she does not drink alcohol or use illicit drugs. family history includes Bone cancer in her father and Parkinsonism in her mother.  There is no history of Colon cancer. Allergies  Allergen Reactions  . Barbiturates   . Codeine   . Droperidol   . Meperidine Hcl   . Sulfonamide Derivatives     REACTION: nausea        Review of Systems: Denies upper GI symptoms of nausea vomiting dysphagia or abdominal pain has resolved area at she is having small frequent stools  The remainder of the 10 point ROS is negative except as outlined in H&P   Physical Exam: General appearance  Well developed, in no distress. Eyes- non icteric. HEENT nontraumatic, normocephalic. Mouth no lesions, tongue papillated, no cheilosis. Neck supple without adenopathy, thyroid  not enlarged, no carotid bruits, no JVD. Lungs Clear to auscultation bilaterally. Cor normal S1, normal S2, regular rhythm, no murmur,  quiet precordium. Abdomen: Soft nontender with normoactive bowel sounds. No distention. No fullness. Rectal: Not done. Extremities no pedal edema. Skin no lesions. Neurological alert and oriented x 3. Psychological normal mood and affect.  Assessment and Plan:  Problem # 1 Status post acute episode of diverticulitis. This was her first episode.ever. A prior colonoscopy in 2006 confirmed moderately severe diverticulosis of the left colon. She is fully recovered at this point. We will proceed with a colonoscopy to access the severity of diverticulosis and to rule out a partial obstruction. Her most recent hemoglobin was 13.1 and hematocrit was 37.6.  08/30/2011 [name]

## 2011-09-18 ENCOUNTER — Encounter: Payer: Medicare Other | Admitting: Internal Medicine

## 2011-11-06 ENCOUNTER — Encounter: Payer: Self-pay | Admitting: Internal Medicine

## 2011-11-06 ENCOUNTER — Ambulatory Visit (AMBULATORY_SURGERY_CENTER): Payer: Medicare Other | Admitting: Internal Medicine

## 2011-11-06 VITALS — BP 129/89 | HR 92 | Temp 98.5°F | Resp 19 | Ht 67.0 in | Wt 132.0 lb

## 2011-11-06 DIAGNOSIS — Z1211 Encounter for screening for malignant neoplasm of colon: Secondary | ICD-10-CM

## 2011-11-06 DIAGNOSIS — K5732 Diverticulitis of large intestine without perforation or abscess without bleeding: Secondary | ICD-10-CM

## 2011-11-06 MED ORDER — SODIUM CHLORIDE 0.9 % IV SOLN: 500.0000 mL | INTRAVENOUS | Status: DC

## 2011-11-06 NOTE — Op Note (Signed)
[facility] [address] [place], [address]   COLONOSCOPY PROCEDURE REPORT  PATIENT: [name], [name]  MR#: [id number] BIRTHDATE: 1940-11-26 , 70  yrs. old GENDER: Female ENDOSCOPIST: Hart Carwin, MD REFERRED BY:  Ivery Quale, M.D. PROCEDURE DATE:  11/06/2011 PROCEDURE:   Colonoscopy, diagnostic and Colonoscopy, screening ASA CLASS:   Class II INDICATIONS:average risk patient for colon cancer, abnormal CT scan 05/2011 c/w thickened left colon, hx diverticulitis, prior colonoscopy in 2002 and 2006,- severe diverticulosis , abdominal pain in the lower left quadrant. MEDICATIONS: MAC sedation, administered by CRNA and Propofol (Diprivan) 150 mg IV  DESCRIPTION OF PROCEDURE:   After the risks and benefits and of the procedure were explained, informed consent was obtained.  A digital rectal exam revealed no abnormalities of the rectum.    The LB [id number]-[id number]  endoscope was introduced through the anus and advanced to the cecum, which was identified by both the appendix and ileocecal valve .  The quality of the prep was good, using MoviPrep .  The instrument was then slowly withdrawn as the colon was fully examined.     COLON FINDINGS: There was severe diverticulosis noted in the sigmoid colon with associated muscular hypertrophy, tortuosity and colonic narrowing.  No bleeding was noted from the diverticulosis. Internal and external hemorrhoids were found.     Retroflexed views revealed no abnormalities.     The scope was then withdrawn from the patient and the procedure completed.  COMPLICATIONS: There were no complications. ENDOSCOPIC IMPRESSION: There was severe diverticulosis noted in the sigmoid colon , narrow lumen with hypertrophied folds and tortuosity, no sign of acute diverticulitid External hemorrhoid and small internal hemorrhoids  RECOMMENDATIONS: 1.  High fiber diet 2.  metamucil 1 tsp daily, 3. consider sigmoid resection if  future attacks of diverticulitis or she there is chronic pain   REPEAT EXAM: In 10 year(s)  for Colonoscopy. if medically stable  cc:  _______________________________ eSigned:  Hart Carwin, MD 11/06/2011 11:28 AM     PATIENT NAME:  [name], [name] MR#: [id number]

## 2011-11-06 NOTE — Progress Notes (Signed)
Patient did not experience any of the following events: a burn prior to discharge; a fall within the facility; wrong site/side/patient/procedure/implant event; or a hospital transfer or hospital admission upon discharge from the facility. (G8907) Patient did not have preoperative order for IV antibiotic SSI prophylaxis. (G8918) Patient did not have preoperative order for IV antibiotic SSI prophylaxis. (G8918)  

## 2011-11-06 NOTE — Patient Instructions (Addendum)

## 2011-11-07 ENCOUNTER — Telehealth: Payer: Self-pay

## 2011-11-07 NOTE — Telephone Encounter (Signed)
  Follow up Call-  Call back number 11/06/2011  Post procedure Call Back phone  # [phone]  Permission to leave phone message Yes     Patient questions:  Do you have a fever, pain , or abdominal swelling? no Pain Score  0 *  Have you tolerated food without any problems? yes  Have you been able to return to your normal activities? yes  Do you have any questions about your discharge instructions: Diet   no Medications  no Follow up visit  no  Do you have questions or concerns about your Care? no  Actions: * If pain score is 4 or above: No action needed, pain <4.

## 2012-04-22 ENCOUNTER — Ambulatory Visit (INDEPENDENT_AMBULATORY_CARE_PROVIDER_SITE_OTHER): Payer: Medicare Other | Admitting: Emergency Medicine

## 2012-04-22 ENCOUNTER — Encounter: Payer: Self-pay | Admitting: Emergency Medicine

## 2012-04-22 VITALS — BP 122/60 | HR 122 | Temp 97.0°F | Ht 67.0 in | Wt 134.4 lb

## 2012-04-22 DIAGNOSIS — R05 Cough: Secondary | ICD-10-CM

## 2012-04-22 DIAGNOSIS — J449 Chronic obstructive pulmonary disease, unspecified: Secondary | ICD-10-CM

## 2012-04-22 DIAGNOSIS — R059 Cough, unspecified: Secondary | ICD-10-CM

## 2012-04-22 DIAGNOSIS — J479 Bronchiectasis, uncomplicated: Secondary | ICD-10-CM

## 2012-04-22 DIAGNOSIS — J4489 Other specified chronic obstructive pulmonary disease: Secondary | ICD-10-CM

## 2012-04-22 MED ORDER — FLUTICASONE PROPIONATE 50 MCG/ACT NA SUSP: 2.0000 | Freq: Two times a day (BID) | NASAL | Status: DC

## 2012-04-22 NOTE — Progress Notes (Signed)
History of Present Illness:   [name] is a 72 year old woman with a history of bronchiectasis, Mycobacterium avium complex colonization, allergic rhinitis and chronic cough.   ROV 08/11/09 -- returns for acute visit, has hx bronchiectasis, allergies, chronic cough. Had been doing well until about 3 weeks ago with nasal drainage, hoarse voice. Was treated with a brief course pred, nasal steroid, doxycycline. While on the pred her UA irritation was better. She is having indigestion and heartburn when she lays down at night. Still nasal drainage.   ROV 08/31/09 -- follows up for cough, hoarse voice. We tried a 2 weeks course of prilosec, added NSW's at bedtime. Was on nasonex, ran out since last time, not currently on it. Stil doing a lot of throat clearing. her hoarseness is a lot better, still coughing every day but less productive. She is also complaining of more SOB. Still with some heartburn, happens when she lays supine.   ROV 01/17/10 -- follow up for her allergic rhinitis, chronic cough, bronchiectasis with MAIC colonization. has been blowing thick mucous from head and sinuses, started cough. Clear thick sputum. Has used [employer], doing NSW's, fluticasone spray. Not taking any loratadine - had more nasal congestion with it. No decongestants currently. Having HA's, some green nasal mucous.   ROV 02/15/10 -- follows up allergies and cough, bronchiectasis (hx MAIC colonization). Saw her 11/30 for URI and worsening of her cough, congestion. Her URI improved and cough improved as well. Doing nasal washes as needed (hard to do consistently after her root canal recently). Also using fluticasone nasal spray, but only as needed.   ROV 02/28/10 -- f/u bronchiectasis, allergies, cough. Still dealing w root canal, not yet back to consistent NSWs or fluticasone. Had high res CT scan to eval her bronchiectasis, shows some slight progression of her RML and lingular bronchiectasis without plugs or nodular  changes. She is still having cough, usually dry, sometimes with thick clear sputum.  ROV 04/22/12 -- follows after long time for her bronchiectasis, allergies, cough. She describes progressive SOB over the last 2-3 months. No CP or wheeze on these occasions. Coughs almost every day for the last 3-4 weeks, paroxysmal, sometimes emesis, sometimes clear thick mucous. Somewhat worse when supine. She had a URI in December '13, treated w abx. She continues to have nasal drainage. She is on omeprazole qd. Not on loratadine, nasal steroid or NSW anymore. No SABA use.   Filed Vitals:   04/22/12 1350  BP: 122/60  Pulse: 122  Temp: 97 F (36.1 C)   Gen: Pleasant, well-nourished, in no distress,  normal affect, gets a little SOB with talking.   ENT: No lesions,  mouth clear,  oropharynx clear, mild erythema, no postnasal drip  Neck: No JVD, no TMG, no carotid bruits  Lungs: No use of accessory muscles, no dullness to percussion, clear without rales or rhonchi  Cardiovascular: RRR, heart sounds normal, no murmur or gallops, no peripheral edema  Musculoskeletal: No deformities, no cyanosis or clubbing  Neuro: alert, non focal  Skin: Warm, no lesions or rashes  CT 02/16/10 --  Comparison: High resolution CT chest 01/16/2007 and 08/29/2006.  Findings: High resolution images demonstrate bronchiectasis in the  right middle lobe, lingula, and both lower lobes which has  progressed since the examinations from 2008. Mucous plugging  involving ectatic bronchi in the lingula. No visible mucus  plugging elsewhere. Peribronchial nodularity involving the  affected lobes, unchanged. Hyperinflation and emphysematous  changes diffusely, unchanged. Stable scarring in the  right middle  lobe and lingula. No localized airspace consolidation. No  evidence of interstitial lung disease. No other pulmonary  parenchymal nodules or masses. No pleural effusions.  Heart size normal. No visible coronary artery  calcification. No  pericardial effusion. No visible atherosclerosis. No significant  mediastinal, hilar, or axillary lymphadenopathy. Approximate 4.3 x  3.7 cm solid lesion arising from the anterior segment right lobe of  liver, not significantly changed since the prior examinations.  Visualized upper abdomen otherwise unremarkable. Bone window  images demonstrate generalized osteopenia and mild spondylosis.  IMPRESSION:  1. Bronchiectasis involving the right middle lobe, lingula, and  both lower lobes, progressive since 2008. Mucous plugging  involving peripheral ectatic bronchi in the lingula and right  middle lobe. Peribronchial nodularity, unchanged, consistent with  the history of MAI infection.  2. COPD/emphysema. No acute cardiopulmonary disease otherwise.  3. 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  COPD, MILD - repeat spirometry/PFT - treat her UA component - may decide to repeat her CT Chest to assess the bronchiectasis - rov next available w PFT.   COUGH - start NSW, fluticasone, loratadine - continue the omeprazole - double x 1 week  BRONCHIECTASIS ? Whether there has been progression. Will decide re: CT scan depending on her PFT and her response to nasal hygiene

## 2012-04-22 NOTE — Assessment & Plan Note (Signed)
?   Whether there has been progression. Will decide re: CT scan depending on her PFT and her response to nasal hygiene

## 2012-04-22 NOTE — Patient Instructions (Addendum)
Please increase your omeprazole to twice a day for a week, then go back to once a day Start nasal saline washes daily Start loratadine (Claritin) 10mg  daily Start fluticasone nasal spray, 2 sprays each side twice a day We will perform full PFT at your next office visit Follow up next available with full PFT.

## 2012-04-22 NOTE — Assessment & Plan Note (Signed)
-   repeat spirometry/PFT - treat her UA component - may decide to repeat her CT Chest to assess the bronchiectasis - rov next available w PFT.

## 2012-04-22 NOTE — Assessment & Plan Note (Signed)
-   start NSW, fluticasone, loratadine - continue the omeprazole - double x 1 week

## 2012-05-07 ENCOUNTER — Encounter: Payer: Self-pay | Admitting: Pulmonary Disease

## 2012-05-21 ENCOUNTER — Ambulatory Visit (INDEPENDENT_AMBULATORY_CARE_PROVIDER_SITE_OTHER): Payer: Medicare Other | Admitting: Emergency Medicine

## 2012-05-21 ENCOUNTER — Encounter: Payer: Self-pay | Admitting: Emergency Medicine

## 2012-05-21 VITALS — BP 126/76 | HR 98 | Temp 97.6°F | Ht 66.0 in | Wt 136.0 lb

## 2012-05-21 DIAGNOSIS — J4489 Other specified chronic obstructive pulmonary disease: Secondary | ICD-10-CM

## 2012-05-21 DIAGNOSIS — R059 Cough, unspecified: Secondary | ICD-10-CM

## 2012-05-21 DIAGNOSIS — J449 Chronic obstructive pulmonary disease, unspecified: Secondary | ICD-10-CM

## 2012-05-21 DIAGNOSIS — R05 Cough: Secondary | ICD-10-CM

## 2012-05-21 DIAGNOSIS — J479 Bronchiectasis, uncomplicated: Secondary | ICD-10-CM

## 2012-05-21 LAB — PULMONARY FUNCTION TEST

## 2012-05-21 MED ORDER — FEXOFENADINE HCL 180 MG PO TABS: 180.0000 mg | ORAL_TABLET | Freq: Every day | ORAL | Status: DC

## 2012-05-21 NOTE — Progress Notes (Signed)
History of Present Illness:   [name] is a 72 year old woman with a history of bronchiectasis, Mycobacterium avium complex colonization, allergic rhinitis and chronic cough.   ROV 08/11/09 -- returns for acute visit, has hx bronchiectasis, allergies, chronic cough. Had been doing well until about 3 weeks ago with nasal drainage, hoarse voice. Was treated with a brief course pred, nasal steroid, doxycycline. While on the pred her UA irritation was better. She is having indigestion and heartburn when she lays down at night. Still nasal drainage.   ROV 08/31/09 -- follows up for cough, hoarse voice. We tried a 2 weeks course of prilosec, added NSW's at bedtime. Was on nasonex, ran out since last time, not currently on it. Stil doing a lot of throat clearing. her hoarseness is a lot better, still coughing every day but less productive. She is also complaining of more SOB. Still with some heartburn, happens when she lays supine.   ROV 01/17/10 -- follow up for her allergic rhinitis, chronic cough, bronchiectasis with MAIC colonization. has been blowing thick mucous from head and sinuses, started cough. Clear thick sputum. Has used [employer], doing NSW's, fluticasone spray. Not taking any loratadine - had more nasal congestion with it. No decongestants currently. Having HA's, some green nasal mucous.   ROV 02/15/10 -- follows up allergies and cough, bronchiectasis (hx MAIC colonization). Saw her 11/30 for URI and worsening of her cough, congestion. Her URI improved and cough improved as well. Doing nasal washes as needed (hard to do consistently after her root canal recently). Also using fluticasone nasal spray, but only as needed.   ROV 02/28/10 -- f/u bronchiectasis, allergies, cough. Still dealing w root canal, not yet back to consistent NSWs or fluticasone. Had high res CT scan to eval her bronchiectasis, shows some slight progression of her RML and lingular bronchiectasis without plugs or nodular  changes. She is still having cough, usually dry, sometimes with thick clear sputum.  ROV 04/22/12 -- follows after long time for her bronchiectasis, allergies, cough. She describes progressive SOB over the last 2-3 months. No CP or wheeze on these occasions. Coughs almost every day for the last 3-4 weeks, paroxysmal, sometimes emesis, sometimes clear thick mucous. Somewhat worse when supine. She had a URI in December '13, treated w abx. She continues to have nasal drainage. She is on omeprazole qd. Not on loratadine, nasal steroid or NSW anymore. No SABA use.   ROV 05/21/12 -- Hx bronchiectasis, chronic cough, allergic rhinitis. She has a hx MAIC colonization, never treated for eradication to my knowledge. She has been having more cough so last time we restarted NSW, fluticasone, loratadine, temporarily increase omeprazole. She is still having some drainage and throat tickle but her cough is better. She still has exertional SOB. She has low energy level, ? Since starting loratadine. Not on BD's right now.   Repeat PFT today >> mild AFL, FEV1 1.93-2.10L 90% (down slightly from 2006, 2.10-2.34L, 90%).   Filed Vitals:   05/21/12 1414  BP: 126/76  Pulse: 98  Temp: 97.6 F (36.4 C)   Gen: Pleasant, well-nourished, in no distress,  normal affect, gets a little SOB with talking.   ENT: No lesions,  mouth clear,  oropharynx clear, mild erythema, no postnasal drip  Neck: No JVD, no TMG, no carotid bruits  Lungs: No use of accessory muscles, no dullness to percussion, clear without rales or rhonchi  Cardiovascular: RRR, heart sounds normal, no murmur or gallops, no peripheral edema  Musculoskeletal: No  deformities, no cyanosis or clubbing  Neuro: alert, non focal  Skin: Warm, no lesions or rashes   CT 02/16/10 --  Comparison: High resolution CT chest 01/16/2007 and 08/29/2006.  Findings: High resolution images demonstrate bronchiectasis in the  right middle lobe, lingula, and both lower lobes  which has  progressed since the examinations from 2008. Mucous plugging  involving ectatic bronchi in the lingula. No visible mucus  plugging elsewhere. Peribronchial nodularity involving the  affected lobes, unchanged. Hyperinflation and emphysematous  changes diffusely, unchanged. Stable scarring in the right middle  lobe and lingula. No localized airspace consolidation. No  evidence of interstitial lung disease. No other pulmonary  parenchymal nodules or masses. No pleural effusions.  Heart size normal. No visible coronary artery calcification. No  pericardial effusion. No visible atherosclerosis. No significant  mediastinal, hilar, or axillary lymphadenopathy. Approximate 4.3 x  3.7 cm solid lesion arising from the anterior segment right lobe of  liver, not significantly changed since the prior examinations.  Visualized upper abdomen otherwise unremarkable. Bone window  images demonstrate generalized osteopenia and mild spondylosis.  IMPRESSION:  1. Bronchiectasis involving the right middle lobe, lingula, and  both lower lobes, progressive since 2008. Mucous plugging  involving peripheral ectatic bronchi in the lingula and right  middle lobe. Peribronchial nodularity, unchanged, consistent with  the history of MAI infection.  2. COPD/emphysema. No acute cardiopulmonary disease otherwise.  3. 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  COPD, MILD She would like to avoid BD's at this time due to side effects. PFT are stable from 2006   BRONCHIECTASIS Will repeat high res CT scan to assess for interval change, possible MAIC that merits treatment Walking oximetry todau  COUGH continue omeprazole continue the NSW, fluticasone. Try changing the loratadine to fexofenadine

## 2012-05-21 NOTE — Assessment & Plan Note (Signed)
Will repeat high res CT scan to assess for interval change, possible MAIC that merits treatment Walking oximetry todau

## 2012-05-21 NOTE — Assessment & Plan Note (Signed)
She would like to avoid BD's at this time due to side effects. PFT are stable from 2006

## 2012-05-21 NOTE — Assessment & Plan Note (Signed)
continue omeprazole continue the NSW, fluticasone. Try changing the loratadine to fexofenadine

## 2012-05-21 NOTE — Progress Notes (Signed)
PFT done today. 

## 2012-05-21 NOTE — Patient Instructions (Addendum)
We will perform a CT scan of your chest to compare with priors Walking oximetry today Continue the nasal saline, fluticasone nasal spray Try changing loratadine to fexofenadine 180mg  daily (Allegra) Follow with Dr Delton Coombes in 1 month or sooner if you have any problems.

## 2012-06-01 ENCOUNTER — Ambulatory Visit (INDEPENDENT_AMBULATORY_CARE_PROVIDER_SITE_OTHER)
Admission: RE | Admit: 2012-06-01 | Discharge: 2012-06-01 | Disposition: A | Discharge status: Home / Self Care | Payer: Medicare Other | Source: Ambulatory Visit | Attending: Emergency Medicine | Admitting: Emergency Medicine

## 2012-06-01 DIAGNOSIS — J479 Bronchiectasis, uncomplicated: Secondary | ICD-10-CM

## 2012-06-19 ENCOUNTER — Encounter (INDEPENDENT_AMBULATORY_CARE_PROVIDER_SITE_OTHER): Payer: Self-pay

## 2012-06-22 ENCOUNTER — Ambulatory Visit: Payer: Medicare Other | Admitting: Emergency Medicine

## 2012-06-24 ENCOUNTER — Ambulatory Visit (INDEPENDENT_AMBULATORY_CARE_PROVIDER_SITE_OTHER): Payer: Medicare Other | Admitting: Emergency Medicine

## 2012-06-24 ENCOUNTER — Encounter: Payer: Self-pay | Admitting: Emergency Medicine

## 2012-06-24 VITALS — BP 110/72 | HR 94 | Temp 97.4°F | Ht 66.5 in | Wt 135.0 lb

## 2012-06-24 DIAGNOSIS — J309 Allergic rhinitis, unspecified: Secondary | ICD-10-CM

## 2012-06-24 DIAGNOSIS — A310 Pulmonary mycobacterial infection: Secondary | ICD-10-CM

## 2012-06-24 DIAGNOSIS — J449 Chronic obstructive pulmonary disease, unspecified: Secondary | ICD-10-CM

## 2012-06-24 NOTE — Assessment & Plan Note (Addendum)
FOB in 01/2007 negative for AFB (post-treatment). Her Ct shows very mild progression, but it is unlikely that untreated MAIC, especially with such mild CT manifestations, would be causing her fatigue and dyspnea.  - at this time I would defer FOB, and I would definitely defer abx unless she has FOB and we confirm MAIC (she has been treated before remotely).

## 2012-06-24 NOTE — Patient Instructions (Addendum)
Your CT scan of the chest shows some mild change and increased inflammation compared with 2011. This is probably not enough to explain your symptoms or to merit a repeat bronchoscopy or antibiotics at this time.  If your symptoms progress or change, then we may decide to repeat the bronchoscopy in the future.  Follow with Dr Delton Coombes in 6 months or sooner if you have any problems

## 2012-06-24 NOTE — Assessment & Plan Note (Signed)
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 I think her pulm testing has been reasuring - defer BD's at this time.  - reconsider if her sx progress - discussed possible deconditioning and initiating a dedicated exercise routine.  - rov 6 mo

## 2012-06-24 NOTE — Assessment & Plan Note (Signed)
-   continue same regimen; try taking the allegra in the am instead of at night

## 2012-06-24 NOTE — Progress Notes (Signed)
History of Present Illness:   [name] is a 72 year old woman with a history of bronchiectasis, Mycobacterium avium complex colonization, allergic rhinitis and chronic cough.    ROV 02/28/10 -- f/u bronchiectasis, allergies, cough. Still dealing w root canal, not yet back to consistent NSWs or fluticasone. Had high res CT scan to eval her bronchiectasis, shows some slight progression of her RML and lingular bronchiectasis without plugs or nodular changes. She is still having cough, usually dry, sometimes with thick clear sputum.  ROV 04/22/12 -- follows after long time for her bronchiectasis, allergies, cough. She describes progressive SOB over the last 2-3 months. No CP or wheeze on these occasions. Coughs almost every day for the last 3-4 weeks, paroxysmal, sometimes emesis, sometimes clear thick mucous. Somewhat worse when supine. She had a URI in December '13, treated w abx. She continues to have nasal drainage. She is on omeprazole qd. Not on loratadine, nasal steroid or NSW anymore. No SABA use.   ROV 05/21/12 -- Hx bronchiectasis, chronic cough, allergic rhinitis. She has a hx MAIC colonization, never treated for eradication to my knowledge. She has been having more cough so last time we restarted NSW, fluticasone, loratadine, temporarily increase omeprazole. She is still having some drainage and throat tickle but her cough is better. She still has exertional SOB. She has low energy level, ? Since starting loratadine. Not on BD's right now.   Repeat PFT today >> mild AFL, FEV1 1.93-2.10L 90% (down slightly from 2006, 2.10-2.34L, 90%).   ROV 06/24/12 -- 72 yo, bronchiectasis, chronic cough, allergic rhinitis. She has a hx MAIC colonization, treated for eradication remotely. Repeat FOB 01/2007, AFB negative. Returns after CT scan performed 06/01/12 > shows some interval progression tree-bud nodular disease and bronchiectasis pred lingula and RML. She is still having low energy, feels that her breathing  is stable. Remains on NSW + fluticasone, changed from loratadine to allegra - no real difference.  She is sleepy, no time to nap. She has some occasional cough, clear thick sputum.    Filed Vitals:   06/24/12 1348  BP: 110/72  Pulse: 94  Temp: 97.4 F (36.3 C)   Gen: Pleasant, well-nourished, in no distress,  normal affect, gets a little SOB with talking.   ENT: No lesions,  mouth clear,  oropharynx clear, mild erythema, no postnasal drip  Neck: No JVD, no TMG, no carotid bruits  Lungs: No use of accessory muscles, no dullness to percussion, clear without rales or rhonchi  Cardiovascular: RRR, heart sounds normal, no murmur or gallops, no peripheral edema  Musculoskeletal: No deformities, no cyanosis or clubbing  Neuro: alert, non focal  Skin: Warm, no lesions or rashes   06/01/12 CT scan --  Comparison: 02/16/2010  Findings: There has been interval progression of tree in bud type  nodular opacities and bronchiectasis predominately in the lingula  and right middle lobe, as well as the bilateral lower lobe. No  pleural effusion, mass, or consolidation is otherwise identified.  Heart size is normal. No lymphadenopathy. Great vessels are  normal in caliber.  Low-density 3.9 cm right hepatic lobe lesion is stable, not further  characterized. Stable common hepatic duct ectasia measuring 1.5  cm, status post cholecystectomy. The bones are osteopenic. No  compression deformity identified. Mild S-shaped curvature of the  thoracolumbar spine is noted.  IMPRESSION:  Interval progression of tree in bud type nodular airspace  opacities, with appearance most typical for atypical infection such  as MAI.   DISEASE, PULMONARY D/T  MYCOBACTERIA FOB in 01/2007 negative for AFB (post-treatment). Her Ct shows very mild progression, but it is unlikely that untreated MAIC, especially with such mild CT manifestations, would be causing her fatigue and dyspnea.  - at this time I would defer FOB,  and I would definitely defer abx unless she has FOB and we confirm MAIC (she has been treated before remotely).   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 I think her pulm testing has been reasuring - defer BD's at this time.  - reconsider if her sx progress - discussed possible deconditioning and initiating a dedicated exercise routine.  - rov 6 mo

## 2012-06-29 ENCOUNTER — Encounter: Payer: Self-pay | Admitting: Emergency Medicine

## 2012-11-20 ENCOUNTER — Other Ambulatory Visit: Payer: Self-pay | Admitting: *Deleted

## 2012-11-20 DIAGNOSIS — R05 Cough: Secondary | ICD-10-CM

## 2012-11-20 MED ORDER — FLUTICASONE PROPIONATE 50 MCG/ACT NA SUSP: 2.0000 | Freq: Two times a day (BID) | NASAL | Status: DC

## 2013-01-26 ENCOUNTER — Ambulatory Visit: Payer: Medicare Other | Admitting: Emergency Medicine

## 2013-02-15 ENCOUNTER — Telehealth: Payer: Self-pay | Admitting: Internal Medicine

## 2013-02-15 NOTE — Telephone Encounter (Signed)
Pt last seen 08/30/11 as a f/u for diverticulitis. Last COLON, 11/06/11 showing sever diverticulosis in the sigmoid area. Pt reports severe LLQ pain that radiates to he back. She has tried a heating pad and NSAIDS with no help. She will see Doug Sou, PA in am

## 2013-02-16 ENCOUNTER — Encounter: Payer: Self-pay | Admitting: Gastroenterology

## 2013-02-16 ENCOUNTER — Ambulatory Visit (INDEPENDENT_AMBULATORY_CARE_PROVIDER_SITE_OTHER): Payer: Medicare Other | Admitting: Gastroenterology

## 2013-02-16 ENCOUNTER — Telehealth: Payer: Self-pay | Admitting: *Deleted

## 2013-02-16 VITALS — BP 110/68 | HR 64 | Ht 66.5 in | Wt 129.6 lb

## 2013-02-16 DIAGNOSIS — K5732 Diverticulitis of large intestine without perforation or abscess without bleeding: Secondary | ICD-10-CM | POA: Insufficient documentation

## 2013-02-16 MED ORDER — CIPROFLOXACIN HCL 500 MG PO TABS: 500.0000 mg | ORAL_TABLET | Freq: Two times a day (BID) | ORAL | Status: AC

## 2013-02-16 MED ORDER — METRONIDAZOLE 500 MG PO TABS: 500.0000 mg | ORAL_TABLET | Freq: Two times a day (BID) | ORAL | Status: AC

## 2013-02-16 NOTE — Progress Notes (Signed)
02/16/2013 Georgette Dover [id number] 1940/02/28   History of Present Illness:  Patient is a pleasant 72 year old female who is known to Dr. Juanda Chance.  She had her first episode of diverticulitis in April 2013 after she developed LLQ abd pain.  She responded to an oral antibiotic regimen of Cipro and Flagyl.  CT scan at that time showed sigmoid diverticulosis with a thickening of the wall and stranding around the sigmoid colon consistent with mild diverticulitis. She also had a right lobe of the liver hemangioma and mild biliary dilation consistent with normal postcholecystectomy state.  After her acute issue had resolved she underwent colonoscopy in 10/2011 at which time she was found to have severe diverticulitis in the sigmoid colon with narrowing, hypertrophied folds and tortuosity.  She comes in today complaining of LLQ abdominal pain that has been present for about the past 1-2 weeks, but getting slightly worse and not resolving.  She has been taking Ibuprofen and using a heating pad.  She denies fevers, chills, nausea, vomiting.  Bowels are loose but she says that they are always very soft.  No blood.  Pain not keeping her from sleep at night.    Current Medications, Allergies, Past Medical History, Past Surgical History, Family History and Social History were reviewed in [employer] record.   Physical Exam: BP 110/68  Pulse 64  Ht 5' 6.5" (1.689 m)  Wt 129 lb 9.6 oz (58.786 kg)  BMI 20.61 kg/m2 General: Well developed white female in no acute distress; non-toxic appearing. Head: Normocephalic and atraumatic Eyes:  Sclerae anicteric, conjunctiva pink  Ears: Normal auditory acuity Lungs: Clear throughout to auscultation Heart: Regular rate and rhythm Abdomen: Soft, non-distended.  BS present.  Mild LLQ and suprapubic TTP without R/R/G. Musculoskeletal: Symmetrical with no gross deformities  Extremities: No edema.  Neurological: Alert oriented x 4, grossly  non-focal Psychological:  Alert and cooperative. Normal mood and affect  Assessment and Recommendations: -LLQ abdominal pain:  Suspect diverticulitis.  Will treat with cipro 500 mg BID and flagyl 500 mg BID for ten days.  She will call back if no improvement in symptoms and then would need a CT scan.

## 2013-02-16 NOTE — Telephone Encounter (Signed)
Doug Sou PA-C told me to make an appointment for the patient to see Dr. Juanda Chance in 4-6 weeks.  I made the appointment for 03-30-2013 at 8:30 am per Dr. Juanda Chance.  Sharlee Blew , the patient's husband took the message and wrote the appointment down and will give this information to his wife.  I told him to have her call us if she has any questions.

## 2013-02-16 NOTE — Progress Notes (Signed)
Reviewed and agree, repeated episodes of diverticulitis. She had significant diverticulosis ( not diverticulitis) on colonoscopy in 9/103. Please schedule OV with me to discuss options

## 2013-02-16 NOTE — Patient Instructions (Signed)
We sent prsecriptions to [facility] [place], [place].  1. Cipro 2. Flagyl ( Metronidazole )   Call us back if you don't get better after the antibiotics.  Call [phone].

## 2013-03-30 ENCOUNTER — Ambulatory Visit: Payer: Medicare Other | Admitting: Internal Medicine

## 2013-05-20 ENCOUNTER — Other Ambulatory Visit (INDEPENDENT_AMBULATORY_CARE_PROVIDER_SITE_OTHER): Payer: Self-pay

## 2013-05-20 ENCOUNTER — Telehealth (INDEPENDENT_AMBULATORY_CARE_PROVIDER_SITE_OTHER): Payer: Self-pay

## 2013-05-20 DIAGNOSIS — C50919 Malignant neoplasm of unspecified site of unspecified female breast: Secondary | ICD-10-CM

## 2013-05-20 NOTE — Telephone Encounter (Signed)
Message copied by Ivor Costa on Thu May 20, 2013  3:29 PM ------      Message from: Jeanann Lewandowsky      Created: Tue May 18, 2013  8:07 AM       Pt is scheduled to have her yearly Diagnostic Mammogram 4.14.15 @ [facility].  Pt left a message that [facility] contacted her yesterday letting her know that she needs an order.  Please advise!            Thanks!            Anderson Malta ------

## 2013-05-20 NOTE — Telephone Encounter (Signed)
Called and spoke to patient to make aware an order for yearly diagnostic mammogram has been entered in [facility].  Patient states she has her MGM on 06/01/13 and we will need to fax a signed order to their office before that date.

## 2013-05-26 ENCOUNTER — Telehealth: Payer: Self-pay | Admitting: Internal Medicine

## 2013-05-26 NOTE — Telephone Encounter (Signed)
Message copied by Oliva Bustard on Wed May 26, 2013  8:16 AM ------      Message from: Larina Bras      Created: Tue Mar 30, 2013  9:29 AM                   ----- Message -----         From: Lafayette Dragon, MD         Sent: 03/30/2013   9:24 AM           To: Larina Bras, CMA            Please charge no show fee.      ----- Message -----         From: Larina Bras, CMA         Sent: 03/30/2013   9:16 AM           To: Lafayette Dragon, MD            Patient no showed Dr Nichola Sizer appointment on 03/30/13. Dr Olevia Perches, do you want to charge no show fee?       ------

## 2013-05-28 ENCOUNTER — Telehealth (INDEPENDENT_AMBULATORY_CARE_PROVIDER_SITE_OTHER): Payer: Self-pay

## 2013-05-28 NOTE — Telephone Encounter (Addendum)
Order for [facility] signed and faxed to [facility] @ [phone]. Fax confirmation rec'd and attached to outgoing fax

## 2013-07-02 ENCOUNTER — Encounter (INDEPENDENT_AMBULATORY_CARE_PROVIDER_SITE_OTHER): Payer: Self-pay

## 2014-02-21 ENCOUNTER — Encounter: Payer: Self-pay | Admitting: *Deleted

## 2014-02-25 ENCOUNTER — Encounter: Payer: Self-pay | Admitting: Internal Medicine

## 2014-02-25 ENCOUNTER — Ambulatory Visit (INDEPENDENT_AMBULATORY_CARE_PROVIDER_SITE_OTHER): Payer: Medicare Other | Admitting: Internal Medicine

## 2014-02-25 VITALS — BP 126/64 | HR 88 | Ht 66.0 in | Wt 116.2 lb

## 2014-02-25 DIAGNOSIS — R195 Other fecal abnormalities: Secondary | ICD-10-CM

## 2014-02-25 DIAGNOSIS — K640 First degree hemorrhoids: Secondary | ICD-10-CM

## 2014-02-25 NOTE — Progress Notes (Signed)
[name] 27-Oct-1940 [id number]  Note: This dictation was prepared with Dragon digital system. Any transcriptional errors that result from this procedure are unintentional.   History of Present Illness: This is a  74 year old white female referred for evaluation of Hemoccult-positive Hemosure   positive on 01/05/2014. We have seen her in the past for similar problem in October 2006 when  her colonoscopy showed diverticulosis. Last colonoscopy in September 2013 showed severe sigmoid diverticulosis with partial obstruction. Prior colonoscopy  showed diverticulosis. Her hemoglobin is normal at 13.3 hematocrit 38.5. He takes ibuprofen 2 once or twice a day for headaches. She denies abdominal pain but has occasional burning in her chest after she takes Imitrex for migraine headaches.    Past Medical History  Diagnosis Date  . Bronchiectasis   . Migraine   . Diverticulosis   . Osteopenia   . Scleroderma, localized   . Plantar fasciitis   . Dilation of biliary tract   . Ovarian cyst   . Lumbar disc herniation   . Vitamin B12 deficiency   . Breast cancer, right   . Allergy     SEASONAL  . GERD (gastroesophageal reflux disease)   . Osteoporosis     EARLY STAGES    Past Surgical History  Procedure Laterality Date  . Total vaginal hysterectomy    . Myringotomy      with tympanoplasty and mastoidectomy  . Laparoscopic cholecystectomy    . Cataract extraction Right   . Breast lumpectomy Right   . Tonsillectomy      Allergies  Allergen Reactions  . Barbiturates   . Codeine   . Droperidol   . Meperidine Hcl   . Sulfonamide Derivatives     REACTION: nausea    Family history and social history have been reviewed.  Review of Systems:  Denies dysphagia. Positive for heartburn.  Denies weight loss. Has symptomatic hemorrhoids  The remainder of the 10 point ROS is negative except as outlined in the H&P  Physical Exam: General Appearance  thin in no distress Eyes  Non  icteric  HEENT  Non traumatic, normocephalic  Mouth No lesion, tongue papillated, no cheilosis Neck Supple without adenopathy, thyroid not enlarged, no carotid bruits, no JVD Lungs Clear to auscultation bilaterally COR Normal S1, normal S2, regular rhythm, no murmur, quiet precordium Abdomen  Soft scaphoid. Normoactive bowel sounds. Minimal tenderness in epigastrium Rectal  And anoscopic exam reveals  no external hemorrhoids. Normal rectal sphincter tone. Small internal hemorrhoid 3. Stool is Hemoccult negative. There is no proctitis there is no prolapse Extremities  No pedal edema Skin No lesions Neurological Alert and oriented x 3 Psychological Normal mood and affect  Assessment and Plan:    74 year old white female with heme positive stool on home exam. I could not reproduce the result on today's exam. She has small internal hemorrhoids which showed no stigmata of recent bleeding. Her last episode of hematochezia was about a month ago. 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 Hemoglobin is normal at 13.3 .We have asked her to stop ibuprofen and start taking omeprazole 20 mg daily which she has at home. We will set her up for upper endoscopy to  r/o UGI source of bleeding. she wants to wait  because of her son's illness .    Delfin Edis 02/25/2014

## 2014-02-25 NOTE — Patient Instructions (Addendum)
It has been recommended to you by your physician that you have a(n) Endoscopy completed. Per your request, we did not schedule the procedure(s) today. Please contact our office at [phone] should you decide to have the procedure completed. STOP Ibuprofen Continue Omeprazole as needed Dr Janie Morning

## 2014-04-19 ENCOUNTER — Encounter: Payer: Self-pay | Admitting: Internal Medicine

## 2014-05-04 ENCOUNTER — Ambulatory Visit (AMBULATORY_SURGERY_CENTER): Payer: Self-pay | Admitting: *Deleted

## 2014-05-04 VITALS — Ht 66.5 in | Wt 118.0 lb

## 2014-05-04 DIAGNOSIS — R195 Other fecal abnormalities: Secondary | ICD-10-CM

## 2014-05-04 NOTE — Progress Notes (Signed)
No egg or soy allergy. No anesthesia problems.  No home O2.  No diet meds.  

## 2014-05-18 ENCOUNTER — Encounter: Payer: Self-pay | Admitting: Internal Medicine

## 2014-05-18 ENCOUNTER — Ambulatory Visit (AMBULATORY_SURGERY_CENTER): Payer: Medicare Other | Admitting: Internal Medicine

## 2014-05-18 VITALS — BP 99/60 | HR 78 | Temp 96.4°F | Resp 12 | Ht 66.5 in | Wt 118.0 lb

## 2014-05-18 DIAGNOSIS — K2950 Unspecified chronic gastritis without bleeding: Secondary | ICD-10-CM | POA: Diagnosis not present

## 2014-05-18 DIAGNOSIS — K2960 Other gastritis without bleeding: Secondary | ICD-10-CM

## 2014-05-18 DIAGNOSIS — R195 Other fecal abnormalities: Secondary | ICD-10-CM | POA: Diagnosis not present

## 2014-05-18 DIAGNOSIS — K2970 Gastritis, unspecified, without bleeding: Secondary | ICD-10-CM

## 2014-05-18 DIAGNOSIS — K2990 Gastroduodenitis, unspecified, without bleeding: Secondary | ICD-10-CM | POA: Diagnosis not present

## 2014-05-18 MED ORDER — SODIUM CHLORIDE 0.9 % IV SOLN: 500.0000 mL | INTRAVENOUS | Status: DC

## 2014-05-18 NOTE — Progress Notes (Signed)
A/ox3 pleased with MAC, report to Jane RN 

## 2014-05-18 NOTE — Op Note (Signed)
[place]  [address]. [place], [address]   ENDOSCOPY PROCEDURE REPORT  PATIENT: [name], [name]  MR#: [id number] BIRTHDATE: Dec 08, 1940 , 73  yrs. old GENDER: female ENDOSCOPIST: Lafayette Dragon, MD REFERRED BY:  Dr Leanna Battles PROCEDURE DATE:  05/18/2014 PROCEDURE:  EGD w/ biopsy ASA CLASS:     Class II INDICATIONS:  Heme positive stool.  Hemoglobin 13.3.  Patient has been on ibuprofen.Marland Kitchen MEDICATIONS: Monitored anesthesia care and Propofol 140 mg IV TOPICAL ANESTHETIC: none  DESCRIPTION OF PROCEDURE: After the risks benefits and alternatives of the procedure were thoroughly explained, informed consent was obtained.  The LB [id number] endoscope was introduced through the mouth and advanced to the second portion of the duodenum , Without limitations.  The instrument was slowly withdrawn as the mucosa was fully examined.    [Esophagus: esophagus was intubated without difficulty. Mucosa in the proximal, mid and distal esophagus was normal. ge junction  was slightly irregular but still within normal limits. There were no erosions or stricture. There was no significant hiatal hernia  Stomach: stomach was insufflated with air,  there were multiple erosions in the gastric antrum covered with coffee ground material which was also floating throughout the stomach. There was no active bleeding.. Multiple biopsies were obtained to rule out H. pylori. Gastric outlet that was normal. Retroflexion of the endoscope revealed normal fundus and cardia.  Duodenum: duodenal bulb and descending duodenum were normal The scope was then withdrawn from the patient and the procedure completed.  COMPLICATIONS: There were no immediate complications.  ENDOSCOPIC IMPRESSION: Erosive gastritis. Possibly due to ibuprofen. Biopsies obtained to rule out H. pylori Coffee-ground material in the stomach explains heme positive stool  RECOMMENDATIONS: 1.  Await pathology  results 2.  Avoid NSAIDs Continue PPI If H.  pylori positive will treat with triple therapy  REPEAT EXAM: for EGD pending biopsy results.  eSigned:  Lafayette Dragon, MD 05/18/2014 10:45 AM    CC:  PATIENT NAME:  Kourtlynn, Trevor MR#: [id number]

## 2014-05-18 NOTE — Patient Instructions (Signed)
YOU HAD AN ENDOSCOPIC PROCEDURE TODAY AT [facility]:   Refer to the procedure report that was given to you for any specific questions about what was found during the examination.  If the procedure report does not answer your questions, please call your gastroenterologist to clarify.  If you requested that your care partner not be given the details of your procedure findings, then the procedure report has been included in a sealed envelope for you to review at your convenience later.  YOU SHOULD EXPECT: Some feelings of bloating in the abdomen. Passage of more gas than usual.  Walking can help get rid of the air that was put into your GI tract during the procedure and reduce the bloating. If you had a lower endoscopy (such as a colonoscopy or flexible sigmoidoscopy) you may notice spotting of blood in your stool or on the toilet paper. If you underwent a bowel prep for your procedure, you may not have a normal bowel movement for a few days.  Please Note:  You might notice some irritation and congestion in your nose or some drainage.  This is from the oxygen used during your procedure.  There is no need for concern and it should clear up in a day or so.  SYMPTOMS TO REPORT IMMEDIATELY:    Following upper endoscopy (EGD)  Vomiting of blood or coffee ground material  New chest pain or pain under the shoulder blades  Painful or persistently difficult swallowing  New shortness of breath  Fever of 100F or higher  Black, tarry-looking stools  For urgent or emergent issues, a gastroenterologist can be reached at any hour by calling [phone].   DIET: Your first meal following the procedure should be a small meal and then it is ok to progress to your normal diet. Heavy or fried foods are harder to digest and may make you feel nauseous or bloated.  Likewise, meals heavy in dairy and vegetables can increase bloating.  Drink plenty of fluids but you should avoid alcoholic beverages  for 24 hours.  ACTIVITY:  You should plan to take it easy for the rest of today and you should NOT DRIVE or use heavy machinery until tomorrow (because of the sedation medicines used during the test).    FOLLOW UP: Our staff will call the number listed on your records the next business day following your procedure to check on you and address any questions or concerns that you may have regarding the information given to you following your procedure. If we do not reach you, we will leave a message.  However, if you are feeling well and you are not experiencing any problems, there is no need to return our call.  We will assume that you have returned to your regular daily activities without incident.  If any biopsies were taken you will be contacted by phone or by letter within the next 1-3 weeks.  Please call us at [phone] if you have not heard about the biopsies in 3 weeks.    SIGNATURES/CONFIDENTIALITY: You and/or your care partner have signed paperwork which will be entered into your electronic medical record.  These signatures attest to the fact that that the information above on your After Visit Summary has been reviewed and is understood.  Full responsibility of the confidentiality of this discharge information lies with you and/or your care-partner.  Gastritis, Avoid NSAIDS , continue PPI

## 2014-05-18 NOTE — Progress Notes (Signed)
Called to room to assist during endoscopic procedure.  Patient ID and intended procedure confirmed with present staff. Received instructions for my participation in the procedure from the performing physician.  

## 2014-05-19 ENCOUNTER — Telehealth: Payer: Self-pay | Admitting: *Deleted

## 2014-05-19 NOTE — Telephone Encounter (Signed)
  Follow up Call-  Call back number 05/18/2014 11/06/2011  Post procedure Call Back phone  # [phone] or [phone]  Permission to leave phone message Yes Yes     Patient questions:  Do you have a fever, pain , or abdominal swelling? No. Pain Score  0 *  Have you tolerated food without any problems? Yes.    Have you been able to return to your normal activities? Yes.    Do you have any questions about your discharge instructions: Diet   No. Medications  No. Follow up visit  No.  Do you have questions or concerns about your Care? No.  Actions: * If pain score is 4 or above: No action needed, pain <4.

## 2014-05-23 ENCOUNTER — Encounter: Payer: Self-pay | Admitting: Internal Medicine

## 2014-08-29 ENCOUNTER — Ambulatory Visit (HOSPITAL_COMMUNITY)
Admission: RE | Admit: 2014-08-29 | Discharge: 2014-08-29 | Disposition: A | Discharge status: Home / Self Care | Payer: Medicare Other | Source: Ambulatory Visit | Attending: Internal Medicine | Admitting: Internal Medicine

## 2014-08-29 ENCOUNTER — Encounter (HOSPITAL_COMMUNITY): Payer: Self-pay

## 2014-08-29 ENCOUNTER — Other Ambulatory Visit (HOSPITAL_COMMUNITY): Payer: Self-pay | Admitting: Internal Medicine

## 2014-08-29 DIAGNOSIS — M810 Age-related osteoporosis without current pathological fracture: Secondary | ICD-10-CM | POA: Insufficient documentation

## 2014-08-29 MED ORDER — DENOSUMAB 60 MG/ML ~~LOC~~ SOLN
60.0000 mg | Freq: Once | SUBCUTANEOUS | Status: AC
  Administered 2014-08-29: 60 mg via SUBCUTANEOUS
  Filled 2014-08-29: qty 1

## 2014-08-29 NOTE — Discharge Instructions (Signed)
Denosumab injection What is this medicine? DENOSUMAB (den oh sue mab) slows bone breakdown. Prolia is used to treat osteoporosis in women after menopause and in men. Xgeva is used to prevent bone fractures and other bone problems caused by cancer bone metastases. Xgeva is also used to treat giant cell tumor of the bone. This medicine may be used for other purposes; ask your health care provider or pharmacist if you have questions. COMMON BRAND NAME(S): Prolia, XGEVA What should I tell my health care provider before I take this medicine? They need to know if you have any of these conditions: -dental disease -eczema -infection or history of infections -kidney disease or on dialysis -low blood calcium or vitamin D -malabsorption syndrome -scheduled to have surgery or tooth extraction -taking medicine that contains denosumab -thyroid or parathyroid disease -an unusual reaction to denosumab, other medicines, foods, dyes, or preservatives -pregnant or trying to get pregnant -breast-feeding How should I use this medicine? This medicine is for injection under the skin. It is given by a health care professional in a hospital or clinic setting. If you are getting Prolia, a special MedGuide will be given to you by the pharmacist with each prescription and refill. Be sure to read this information carefully each time. For Prolia, talk to your pediatrician regarding the use of this medicine in children. Special care may be needed. For Xgeva, talk to your pediatrician regarding the use of this medicine in children. While this drug may be prescribed for children as young as 13 years for selected conditions, precautions do apply. Overdosage: If you think you've taken too much of this medicine contact a poison control center or emergency room at once. Overdosage: If you think you have taken too much of this medicine contact a poison control center or emergency room at once. NOTE: This medicine is only for  you. Do not share this medicine with others. What if I miss a dose? It is important not to miss your dose. Call your doctor or health care professional if you are unable to keep an appointment. What may interact with this medicine? Do not take this medicine with any of the following medications: -other medicines containing denosumab This medicine may also interact with the following medications: -medicines that suppress the immune system -medicines that treat cancer -steroid medicines like prednisone or cortisone This list may not describe all possible interactions. Give your health care provider a list of all the medicines, herbs, non-prescription drugs, or dietary supplements you use. Also tell them if you smoke, drink alcohol, or use illegal drugs. Some items may interact with your medicine. What should I watch for while using this medicine? Visit your doctor or health care professional for regular checks on your progress. Your doctor or health care professional may order blood tests and other tests to see how you are doing. Call your doctor or health care professional if you get a cold or other infection while receiving this medicine. Do not treat yourself. This medicine may decrease your body's ability to fight infection. You should make sure you get enough calcium and vitamin D while you are taking this medicine, unless your doctor tells you not to. Discuss the foods you eat and the vitamins you take with your health care professional. See your dentist regularly. Brush and floss your teeth as directed. Before you have any dental work done, tell your dentist you are receiving this medicine. Do not become pregnant while taking this medicine or for 5 months after stopping   it. Women should inform their doctor if they wish to become pregnant or think they might be pregnant. There is a potential for serious side effects to an unborn child. Talk to your health care professional or pharmacist for more  information. What side effects may I notice from receiving this medicine? Side effects that you should report to your doctor or health care professional as soon as possible: -allergic reactions like skin rash, itching or hives, swelling of the face, lips, or tongue -breathing problems -chest pain -fast, irregular heartbeat -feeling faint or lightheaded, falls -fever, chills, or any other sign of infection -muscle spasms, tightening, or twitches -numbness or tingling -skin blisters or bumps, or is dry, peels, or red -slow healing or unexplained pain in the mouth or jaw -unusual bleeding or bruising Side effects that usually do not require medical attention (Report these to your doctor or health care professional if they continue or are bothersome.): -muscle pain -stomach upset, gas This list may not describe all possible side effects. Call your doctor for medical advice about side effects. You may report side effects to FDA at 1-800-FDA-1088. Where should I keep my medicine? This medicine is only given in a clinic, doctor's office, or other health care setting and will not be stored at home. NOTE: This sheet is a summary. It may not cover all possible information. If you have questions about this medicine, talk to your doctor, pharmacist, or health care provider.  2015, Elsevier/Gold Standard. (2011-08-05 12:37:47)  

## 2014-09-02 ENCOUNTER — Other Ambulatory Visit: Payer: Self-pay | Admitting: Internal Medicine

## 2014-09-02 DIAGNOSIS — R9389 Abnormal findings on diagnostic imaging of other specified body structures: Secondary | ICD-10-CM

## 2014-09-02 DIAGNOSIS — R053 Chronic cough: Secondary | ICD-10-CM

## 2014-09-02 DIAGNOSIS — R05 Cough: Secondary | ICD-10-CM

## 2014-09-05 ENCOUNTER — Telehealth: Payer: Self-pay | Admitting: Emergency Medicine

## 2014-09-05 NOTE — Telephone Encounter (Signed)
I am okay with change. 

## 2014-09-05 NOTE — Telephone Encounter (Signed)
Called and spoke to pt. Pt requesting to change providers from [facility] to [facility]. Pt's husband is a pt of Dr. Juanetta Gosling.   Dr. Lamonte Sakai please advise if ok for pt tp change providers.   Dr. Halford Chessman please advise if ok to accept pt.

## 2014-09-05 NOTE — Telephone Encounter (Signed)
Pt scheduled for next available with [facility].  Nothing further needed.

## 2014-09-06 ENCOUNTER — Ambulatory Visit
Admission: RE | Admit: 2014-09-06 | Discharge: 2014-09-06 | Disposition: A | Discharge status: Home / Self Care | Payer: Medicare Other | Source: Ambulatory Visit | Attending: Internal Medicine | Admitting: Internal Medicine

## 2014-09-06 DIAGNOSIS — R9389 Abnormal findings on diagnostic imaging of other specified body structures: Secondary | ICD-10-CM

## 2014-09-06 DIAGNOSIS — R05 Cough: Secondary | ICD-10-CM

## 2014-09-06 DIAGNOSIS — R053 Chronic cough: Secondary | ICD-10-CM

## 2014-09-06 LAB — IFOBT (OCCULT BLOOD): IMMUNOLOGICAL FECAL OCCULT BLOOD TEST: POSITIVE

## 2014-09-06 MED ORDER — IOPAMIDOL (ISOVUE-300) INJECTION 61%
75.0000 mL | Freq: Once | INTRAVENOUS | Status: AC | PRN
  Administered 2014-09-06: 75 mL via INTRAVENOUS

## 2014-09-08 ENCOUNTER — Telehealth: Payer: Self-pay | Admitting: Pulmonary Disease

## 2014-09-08 NOTE — Telephone Encounter (Signed)
Patient husband made aware of upcoming appt scheduled 09/12/14 (per wife request) at 11:15 Nothing further needed.

## 2014-09-08 NOTE — Telephone Encounter (Signed)
(  Patient is the wife of a current patient of yours) Patient is scheduled with [facility] on 10/20/2014 at 4:30 - switching from [facility] to [facility] Pt requesting sooner appt - feels that her symptoms are worsening and feels that her MAC is back.  Pt had recent CT chest (ordered by Dr Sharlett Iles) 09/06/14 You have an opening on 09/12/14 at 11:15 -- are you okay with seeing this patient in a 15 min slot or are you needing for time with her? Patient is not wanting to wait until Sept 1st but you have no other openings unless we double book you one day at 4:30 here soon. Please advise Dr Halford Chessman. Thanks.

## 2014-09-08 NOTE — Telephone Encounter (Signed)
Ok to do 15 minute slot on 09/12/14.

## 2014-09-12 ENCOUNTER — Encounter: Payer: Self-pay | Admitting: Pulmonary Disease

## 2014-09-12 ENCOUNTER — Ambulatory Visit (INDEPENDENT_AMBULATORY_CARE_PROVIDER_SITE_OTHER): Payer: Medicare Other | Admitting: Pulmonary Disease

## 2014-09-12 ENCOUNTER — Institutional Professional Consult (permissible substitution): Payer: Medicare Other | Admitting: Internal Medicine

## 2014-09-12 VITALS — BP 104/72 | HR 89 | Ht 66.5 in | Wt 115.4 lb

## 2014-09-12 DIAGNOSIS — A310 Pulmonary mycobacterial infection: Secondary | ICD-10-CM

## 2014-09-12 DIAGNOSIS — J449 Chronic obstructive pulmonary disease, unspecified: Secondary | ICD-10-CM

## 2014-09-12 DIAGNOSIS — J479 Bronchiectasis, uncomplicated: Secondary | ICD-10-CM

## 2014-09-12 NOTE — Progress Notes (Signed)
Chief Complaint  Patient presents with  . Follow-up    Pt had recent CT scan - abn area in left lung. Pt concerned that MAC has returned. H/o MAC starting back in 2005.     History of Present Illness: [name] is a 74 y.o. 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  She was previously followed by Dr. Lamonte Sakai.  Her husband has followed me for his pulmonary condition.  She asked to have her care switched to me.  She has hx of recurrent MAC.  She was treated in 2005 and 2007.  She was previously seen by Dr. Johnnye Sima with ID for MAC and recurrent MRSA.  There was concern for recurrence of MAI on CT chest in 2014 >> opted for clinical monitoring.  She started feeling sick again earlier this year.  She has noticed trouble with her breathing for years.  She has tried inhalers, but never helped.  Her breathing has been getting worse this year.  She has more cough, and will occasionally bring up yellow sputum.  She denies hemoptysis.  She has noticed sinus drainage.  She has not had fever, and is not aware of wheezing.  She denies sweats, but does get hot flashes as night.  She also feels chilled easily.  She denies joint pain or skin rashes.  She has lost about 25 lbs over the past few months.  She feels fatigued all the time.  She had chest xray which showed abnormality.  She was treated with antibiotics.  The abnormality did not improve.  She had CT chest which showed progression of nodularity with concern for recurrence of MAI.  She was seen by Dr. Olevia Perches with GI earlier this year for diarrhea and heme positive stools.  She had EGD which showed erosive gastritis, and this was felt to be related to NSAID use.    She was taken NSAIDs for migraine headaches, and back pain.  She was recently started on pamelor for pain symptoms.  This has caused dryness.  She denies hx of connective tissue disease.  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   Physical Exam: BP 104/72 mmHg  Pulse 89  Ht 5' 6.5" (1.689 m)  Wt 115 lb 6.4 oz (52.345 kg)  BMI 18.35 kg/m2  SpO2 95%  General - pleasant, thin ENT - No sinus tenderness, no oral exudate, no LAN Cardiac - s1s2 regular, no murmur Chest - faint inspiratory squeaks b/l, no wheeze or crackles Back - No focal tenderness Abd - Soft, non-tender Ext - No edema Neuro - Normal strength Skin - No rashes Psych - normal mood, and behavior  Discussion: She has hx of MAI with bronchiectasis.  She has subacute worsening of her dyspnea.  This has been associated with fatigue, chills, and weight loss.  Her CT chest shows findings of increased  nodularity, and is most concerning for progression/recurrence of MAI.  While it is possible that larger nodules could also represent malignancy, this seems less likely given appearance of lesions.  Assessment/Plan:  Hx of Mycobacterium avium-intracellulare with increase nodularity concerning for progression/recurrence of MAI. Plan: - will get samples of her sputum for gram stain/cx, AFB, and fungal cx - if she is not able to produce sputum sample or results are inconclusive, then she will need bronchoscopy - depending on sputum results, she will likely need referral back to ID  COPD with bronchiectasis. She reports lack of response to inhaler therapy  previously. Plan: - will defer inhaler therapy  Weight loss. Could be related to MAI. Plan: - f/u with her PCP   Chesley Mires, MD [place] [facility] Pager:  [phone]

## 2014-09-12 NOTE — Patient Instructions (Signed)
Will get samples of sputum >> call if not able to produce samples Follow up as previously scheduled on September 1

## 2014-09-13 ENCOUNTER — Encounter: Payer: Self-pay | Admitting: Pulmonary Disease

## 2014-09-15 ENCOUNTER — Other Ambulatory Visit: Payer: Medicare Other

## 2014-09-15 DIAGNOSIS — A310 Pulmonary mycobacterial infection: Secondary | ICD-10-CM

## 2014-09-16 ENCOUNTER — Telehealth: Payer: Self-pay | Admitting: Pulmonary Disease

## 2014-09-16 NOTE — Telephone Encounter (Signed)
Received a call from [facility]. Pt's sputum culture smear came back positive. No further information was given from [place].

## 2014-09-20 NOTE — Telephone Encounter (Signed)
Noted  

## 2014-09-23 ENCOUNTER — Telehealth: Payer: Self-pay | Admitting: Pulmonary Disease

## 2014-09-23 NOTE — Telephone Encounter (Signed)
LMTCB x 1 Will need to be given a sputum cup to obtain sample this weekend.

## 2014-09-23 NOTE — Telephone Encounter (Signed)
Sputum from 09/15/14 with AFB on smear.  Concern is for recurrent MAC.  Will have my nurse arrange for second sputum sample for AFB.

## 2014-09-28 ENCOUNTER — Telehealth: Payer: Self-pay | Admitting: Pulmonary Disease

## 2014-09-28 NOTE — Telephone Encounter (Signed)
Received call stating pt is 1+ acid fast bacilli and reported preliminary results for Mycobacterium Avium. Results placed in [facility] lookats.   Will send to Dr. Halford Chessman.

## 2014-09-28 NOTE — Telephone Encounter (Signed)
She can wait until she finishes antibiotics before getting additional sputum culture.

## 2014-09-28 NOTE — Telephone Encounter (Signed)
Spoke with patient, aware to contact our office when she completes abx.  Aware of rec's per [facility].  Nothing further needed.

## 2014-09-28 NOTE — Telephone Encounter (Signed)
Patient aware that she needs to come by and pick up sputum cup for repeat AFB sample. Pt states that she has been unable to come by to pick this up d/t being at the hospital with her spouse.  Pt has been sitting at hospital with husband who has PNA - pt states that she came down with bronchitis and was seen by PCP 09/26/14.  Rocephin injection given by PCP 09/26/14--patient also taking Cefdinir 300mg  BID (x 7 days) currently. Started abx 09/27/14.  Patient wanting to know if she needs to wait until completing the abx to drop off sputum sample? Please advise Dr Halford Chessman. Thanks.

## 2014-10-02 NOTE — Telephone Encounter (Signed)
Noted  

## 2014-10-10 LAB — FUNGUS CULTURE W SMEAR: SMEAR RESULT: NONE SEEN

## 2014-10-14 NOTE — Telephone Encounter (Signed)
Nothing further needed at this time. Will sign off.  

## 2014-10-20 ENCOUNTER — Ambulatory Visit (INDEPENDENT_AMBULATORY_CARE_PROVIDER_SITE_OTHER): Payer: Medicare Other | Admitting: Pulmonary Disease

## 2014-10-20 ENCOUNTER — Ambulatory Visit: Payer: Medicare Other | Admitting: Pulmonary Disease

## 2014-10-20 ENCOUNTER — Encounter: Payer: Self-pay | Admitting: Pulmonary Disease

## 2014-10-20 VITALS — BP 114/80 | HR 97 | Temp 98.3°F | Ht 66.0 in | Wt 112.6 lb

## 2014-10-20 DIAGNOSIS — A310 Pulmonary mycobacterial infection: Secondary | ICD-10-CM | POA: Diagnosis not present

## 2014-10-20 DIAGNOSIS — J479 Bronchiectasis, uncomplicated: Secondary | ICD-10-CM | POA: Diagnosis not present

## 2014-10-20 NOTE — Patient Instructions (Signed)
Will arrange for sputum sample Will arrange for referral to Infectious Disease Doctor  Follow up in 2 months

## 2014-10-20 NOTE — Progress Notes (Signed)
Chief Complaint  Patient presents with  . Follow-up    pthas been coughing up sputum that is getting thicker and yellowish in color.     History of Present Illness: [name] is a 74 y.o. 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  She has been under a lot of stress related to her husbands health status.  He is now in home hospice.  She is try to keep up with her health, but it is a struggle.    She is getting cough with thick, clear sputum.  She has trouble bring phlegm up at times.  She denies hemoptysis, fever, or chest pain.  She does get chills.     TESTS: PFT 12/27/04 >> FEV1 2.34 (100%), FEV1% 56, TLC 6.04 (115%), DLCO 87% Bronchoscopy 02/04/07 >> Aspergillus species  PFT 05/21/12 >> FEV1 2.10 (98%), FEV1% 56%, TLC 4.83 (93%), DLCO 77% Sputum 09/15/14 >> MAI  CT CHEST: 08/29/06 >> 6 mm nodule lingula, 2 mm nodule LLL, BTX in lingula and RML,  01/17/07 >> no change 02/16/10 >> BTX RML/lingula/RLL/LLL 06/01/12 >> progression of tree in bud pattern 09/06/14 >> BTX, increased nodularity, 2 cm nodule RLL, 1.3 cm nodule LLL  Past medical hx >> Migraine headaches, Low back pain, Breast cancer on Rt s/p lumpectomy, Osteoporosis, GERD, Diverticulosis, 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   Physical Exam: BP 114/80 mmHg  Pulse 97  Temp(Src) 98.3 F (36.8 C) (Oral)  Ht 5\' 6"  (1.676 m)  Wt 112 lb 9.6 oz (51.075 kg)  BMI 18.18 kg/m2  SpO2 98%  General - pleasant, thin ENT - No sinus tenderness, no oral exudate, no LAN Cardiac - s1s2 regular, no murmur Chest - faint inspiratory squeak in Lt lower lung field >> cleared with cough Back - No focal tenderness Abd - Soft, non-tender Ext - No edema Neuro - Normal strength Skin - No rashes Psych - normal mood, and behavior  Assessment/Plan:  Hx of Mycobacterium avium-intracellulare with increase nodularity concerning for progression/recurrence  of MAI. Plan: - will get 2nd sputum specimem - will arrange for ID consult  COPD with bronchiectasis. She reports lack of response to inhaler therapy previously. Plan: - will defer inhaler therapy - she can try mucinex to help with expectoration - if no improvement with expectoration, then could try flutter valve   Chesley Mires, MD [place] [facility]Critical [facility]/Sleep Pager:  [phone]

## 2014-10-26 ENCOUNTER — Other Ambulatory Visit: Payer: Medicare Other

## 2014-10-26 DIAGNOSIS — A310 Pulmonary mycobacterial infection: Secondary | ICD-10-CM

## 2014-10-26 DIAGNOSIS — J479 Bronchiectasis, uncomplicated: Secondary | ICD-10-CM

## 2014-10-31 LAB — AFB CULTURE WITH SMEAR (NOT AT ARMC)

## 2014-11-04 ENCOUNTER — Encounter: Payer: Self-pay | Admitting: Internal Medicine

## 2014-11-04 ENCOUNTER — Ambulatory Visit (INDEPENDENT_AMBULATORY_CARE_PROVIDER_SITE_OTHER): Payer: Medicare Other | Admitting: Internal Medicine

## 2014-11-04 VITALS — BP 94/60 | HR 95 | Temp 98.3°F | Ht 66.5 in | Wt 112.0 lb

## 2014-11-04 DIAGNOSIS — J471 Bronchiectasis with (acute) exacerbation: Secondary | ICD-10-CM

## 2014-11-04 DIAGNOSIS — J479 Bronchiectasis, uncomplicated: Secondary | ICD-10-CM

## 2014-11-04 DIAGNOSIS — R059 Cough, unspecified: Secondary | ICD-10-CM | POA: Insufficient documentation

## 2014-11-04 DIAGNOSIS — R05 Cough: Secondary | ICD-10-CM | POA: Insufficient documentation

## 2014-11-04 MED ORDER — CIPROFLOXACIN HCL 750 MG PO TABS: 750.0000 mg | ORAL_TABLET | Freq: Two times a day (BID) | ORAL | Status: DC

## 2014-11-04 MED ORDER — FLUTTER DEVI: Status: DC

## 2014-11-04 MED ORDER — METHYLPREDNISOLONE ACETATE 80 MG/ML IJ SUSP
120.0000 mg | Freq: Once | INTRAMUSCULAR | Status: AC
  Administered 2014-11-04: 120 mg via INTRAMUSCULAR

## 2014-11-04 MED ORDER — FLUTTER DEVI: Status: AC

## 2014-11-04 NOTE — Patient Instructions (Addendum)
cipro 750 twice daily x 10 days  For cough use mucinex dm up to 1200 mg every 12 hours and use the flutter as much as you can   Try prilosec otc 20mg   Take 30-60 min before first meal of the day and Pepcid ac (famotidine) 20 mg one @  bedtime until cough is completely gone for at least a week without the need for cough suppression  GERD (REFLUX)  is an extremely common cause of respiratory symptoms just like yours , many times with no obvious heartburn at all.    It can be treated with medication, but also with lifestyle changes including elevation of the head of your bed (ideally with 6 inch  bed blocks),  Smoking cessation, avoidance of late meals, excessive alcohol, and avoid fatty foods, chocolate, peppermint, colas, red wine, and acidic juices such as orange juice.  NO MINT OR MENTHOL PRODUCTS SO NO COUGH DROPS  USE SUGARLESS CANDY INSTEAD (Jolley ranchers or Stover's or Life Savers) or even ice chips will also do - the key is to swallow to prevent all throat clearing. NO OIL BASED VITAMINS - use powdered substitutes.  Try leaving off the inderal to see if helps cough and breathing without making you shake too bad

## 2014-11-04 NOTE — Progress Notes (Signed)
History of Present Illness: [name] is a 74 y.o. 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 She has been under a lot of stress related to her husbands health status.  He is now in home hospice. She is try to keep up with her health, but it is a struggle.   She is getting cough with thick, clear sputum.  She has trouble bring phlegm up at times.  She denies hemoptysis, fever, or chest pain.  She does get chills. rec Will arrange for sputum sample Will arrange for referral to Infectious Disease Doctor  Sputum 10/26/14 sumbitted > AFB neg smear   11/04/2014 acute extended ov/Wert re: bronchiectatic pt with h/o atypical TB  Cc cough > sob  Chief Complaint  Patient presents with  . Acute Visit    Pt c/o increased SOB for the past past wk. She is also coughing more- prod with clear to yellow sputum. She is coughing at times until the point of vomiting.       Cough gradually worse x one week but never better since last ov and now cough to vomit 24/7, mostly sob with coughing fits  No obvious day to day or daytime variability or assoc   cp or chest tightness, subjective wheeze or overt sinus or hb symptoms. No unusual exp hx or h/o childhood pna/ asthma or knowledge of premature birth.  Sleeping ok without nocturnal  or early am exacerbation  of respiratory  c/o's or need for noct saba. Also denies any obvious fluctuation of symptoms with weather or environmental changes or other aggravating or alleviating factors except as outlined above   Current Medications, Allergies, Complete Past Medical History, Past Surgical History, Family History, and Social History were reviewed in [employer] record.  ROS  The following are not active complaints unless bolded sore throat, dysphagia, dental problems, itching, sneezing,  nasal congestion or excess/ purulent secretions, ear ache,   fever, chills, sweats, unintended wt loss, classically pleuritic or exertional cp, hemoptysis,  orthopnea  pnd or leg swelling, presyncope, palpitations, abdominal pain, anorexia, nausea, vomiting, diarrhea  or change in bowel or bladder habits, change in stools or urine, dysuria,hematuria,  rash, arthralgias, visual complaints, headache, numbness, weakness or ataxia or problems with walking or coordination,  change in mood/affect or memory.             TESTS: PFT 12/27/04 >> FEV1 2.34 (100%), FEV1% 56, TLC 6.04 (115%), DLCO 87% Bronchoscopy 02/04/07 >> Aspergillus species  PFT 05/21/12 >> FEV1 2.10 (98%), FEV1% 56%, TLC 4.83 (93%), DLCO 77% Sputum 09/15/14 >> MAI  CT CHEST: 08/29/06 >> 6 mm nodule lingula, 2 mm nodule LLL, BTX in lingula and RML,  01/17/07 >> no change 02/16/10 >> BTX RML/lingula/RLL/LLL 06/01/12 >> progression of tree in bud pattern 09/06/14 >> BTX, increased nodularity, 2 cm nodule RLL, 1.3 cm nodule LLL 10/26/14 sputum > AFB neg smear >>>  Past medical hx >> Migraine headaches, Low back pain, Breast cancer on Rt s/p lumpectomy, Osteoporosis, GERD, Diverticulosis, 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, Allergic rhinitis  Past surgical hx, Medications, Allergies, Family hx, Social hx all reviewed   Physical Exam:  Wt Readings from Last 3 Encounters:  11/04/14 112 lb (50.803 kg)  10/20/14 112 lb 9.6 oz (51.075 kg)  09/12/14 115 lb 6.4 oz (52.345 kg)    Vital signs reviewed    General -   thin wf anxious/ looks miserable with honking harsh coughing fits ENT - No sinus tenderness, no oral  exudate, no LAN Cardiac - s1s2 regular, no murmur Chest - insp and exp junky rhonci bilaterally  Back - No focal tenderness Abd - Soft, non-tender Ext - No edema Neuro - Normal strength Skin - No rashes Psych - normal mood, and behavior         I personally reviewed images and agree with radiology impression as follows:  CT  09/06/14 1. 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 Today's study demonstrates several new larger areas of nodularity, which are favored to be part of the underlying chronic infectious process, or could indicate areas of superinfection related to chronic bronchial obstruction. The largest of these is in the periphery of the right lower lobe measuring approximately 1.4 x 2.0 cm on today's exam (image 48 of series 6). Attention on short-term follow-up repeat chest CT in 2-3 months following trial of antimicrobial therapy is recommended to ensure the stability or resolution of this finding.

## 2014-11-05 ENCOUNTER — Encounter: Payer: Self-pay | Admitting: Internal Medicine

## 2014-11-05 NOTE — Assessment & Plan Note (Addendum)
PFT 12/27/04 >> FEV1 2.34 (100%), FEV1% 56, TLC 6.04 (115%), DLCO 87% Bronchoscopy 02/04/07 >> Aspergillus species  PFT 05/21/12 >> FEV1 2.10 (98%), FEV1% 56%, TLC 4.83 (93%), DLCO 77% Sputum 09/15/14 >> MAI  CT CHEST: 08/29/06 >> 6 mm nodule lingula, 2 mm nodule LLL, BTX in lingula and RML,  01/17/07 >> no change 02/16/10 >> BTX RML/lingula/RLL/LLL 06/01/12 >> progression of tree in bud pattern 09/06/14 >> BTX, increased nodularity, 2 cm nodule RLL, 1.3 cm nodule LLL 10/26/14 sputum > AFB neg smear >>>   DDX of  difficult airways management all start with A and  include Adherence, Ace Inhibitors, Acid Reflux, Active Sinus Disease, Alpha 1 Antitripsin deficiency, Anxiety masquerading as Airways dz,  ABPA,  allergy(esp in young), Aspiration (esp in elderly), Adverse effects of meds,  Active smokers, A bunch of PE's (a small clot burden can't cause this syndrome unless there is already severe underlying pulm or vascular dz with poor reserve) plus two Bs  = Bronchiectasis and Beta blocker use..and one C= CHF    Adherence is always the initial "prime suspect" and is a multilayered concern that requires a "trust but verify" approach in every patient - starting with knowing how to use medications, especially inhalers, correctly, keeping up with refills and understanding the fundamental difference between maintenance and prns vs those medications only taken for a very short course and then stopped and not refilled.   ? Acid (or non-acid) GERD > always difficult to exclude as up to 75% of pts in some series report no assoc GI/ Heartburn symptoms> rec max (24h)  acid suppression and diet restrictions/ reviewed and instructions given in writing.    ? Allergy/ asthmatic component > apparently intolerant of prednisone so given Depo-Medrol 120 mg IM here in the office today.  ? Beta blocker effect > she is on Inderal for tremor but I believe this is counterproductive in a patient who has demonstrated airflow  obstruction and of asked her to try off of it to see what difference if any this makes in her respiratory symptoms and if intolerable tremor resumes then I would certainly consider restarting it or an alternative.  The tremor however will make it very difficult to treat her with bronchodilators with the exception of something like Spiriva respimat  Bronchiectasis/ ? MAI recurrent > the negative AFB smears suggest MAI may not be a pathogen here but on the other hand she tells me that she did better when placed on chronic therapy. I'm not willing to commit her to one to 2 years of therapy today but certainly we can give her Cipro 750 twice a day for 10 days which should help with the acute symptoms. Also she would benefit from a flutter valve and maximum treatment with guaifenesin.  I had an extended discussion with the patient reviewing all relevant studies completed to date and  lasting 25 minutes of a 40 minute visit    Each maintenance medication was reviewed in detail including most importantly the difference between maintenance and prns and under what circumstances the prns are to be triggered using an action plan format that is not reflected in the computer generated alphabetically organized AVS.    Please see instructions for details which were reviewed in writing and the patient given a copy highlighting the part that I personally wrote and discussed at today's ov.

## 2014-11-16 ENCOUNTER — Ambulatory Visit: Payer: Medicare Other | Admitting: Infectious Disease

## 2014-11-17 ENCOUNTER — Telehealth: Payer: Self-pay | Admitting: Pulmonary Disease

## 2014-11-17 NOTE — Telephone Encounter (Signed)
Will forward to VS to advise on. Pt has follow up appt with VS on 12-20-14. Thanks.

## 2014-11-18 NOTE — Telephone Encounter (Signed)
Will forward to [facility]

## 2014-11-18 NOTE — Telephone Encounter (Signed)
What am I advising on?  There is no information that I can find in phone message.

## 2014-11-18 NOTE — Telephone Encounter (Signed)
She already has appt with ID on 11/30/14.  Nothing further needs to be done at this time.

## 2014-11-18 NOTE — Telephone Encounter (Signed)
11/17/2014 05:03 PM Phone (Incoming) [facility]     Call Report: Pos. for [place].

## 2014-11-30 ENCOUNTER — Ambulatory Visit (INDEPENDENT_AMBULATORY_CARE_PROVIDER_SITE_OTHER): Payer: Medicare Other | Admitting: Infectious Disease

## 2014-11-30 ENCOUNTER — Encounter: Payer: Self-pay | Admitting: Infectious Disease

## 2014-11-30 VITALS — BP 111/77 | HR 98 | Temp 98.1°F | Wt 112.0 lb

## 2014-11-30 DIAGNOSIS — R634 Abnormal weight loss: Secondary | ICD-10-CM

## 2014-11-30 DIAGNOSIS — A310 Pulmonary mycobacterial infection: Secondary | ICD-10-CM

## 2014-11-30 DIAGNOSIS — J471 Bronchiectasis with (acute) exacerbation: Secondary | ICD-10-CM | POA: Diagnosis not present

## 2014-11-30 DIAGNOSIS — J479 Bronchiectasis, uncomplicated: Secondary | ICD-10-CM

## 2014-11-30 HISTORY — DX: Bronchiectasis, uncomplicated: J47.9

## 2014-11-30 HISTORY — DX: Abnormal weight loss: R63.4

## 2014-11-30 MED ORDER — RIFAMPIN 300 MG PO CAPS: 600.0000 mg | ORAL_CAPSULE | Freq: Every day | ORAL | Status: DC

## 2014-11-30 MED ORDER — ETHAMBUTOL HCL 400 MG PO TABS: 800.0000 mg | ORAL_TABLET | Freq: Every day | ORAL | Status: DC

## 2014-11-30 MED ORDER — AZITHROMYCIN 500 MG PO TABS: 500.0000 mg | ORAL_TABLET | Freq: Every day | ORAL | Status: DC

## 2014-11-30 NOTE — Progress Notes (Signed)
Reason for Consult: M avium with nodules and bronchiectatic disease  Requesting Physician: Dr. Halford Chessman   Subjective:    Patient ID: [name], female    DOB: [birth year], 74 y.o.   MRN: [id number]  HPI  74 year old with nodular, bronchiectatic M avium infection that has previously been treated with Clarithro/ ETHambutol and Rifampin and followed by my partner Dr. Johnnye Sima most recently in 2008 when he also treated her for a MRSA infection of her ear.   Since 2008 the patient has managed to remain off  M avium drugs but in recent months she has not only continued to cough but she has had significant WEIGHT LOSS.   Recent CT scan in July shows progression of her nodular bronchiectatic lung disease and M avium has been isolated on cultures from sputum in July and now September of 2016.  While she did not always enjoy being on the medicines she realizes that now that she is beginning to lose weight and have progression of her lung disease, it is time to restart M avium drugs.  Past Medical History  Diagnosis Date  . Bronchiectasis ([place])   . Migraine   . Diverticulosis   . Osteopenia   . Plantar fasciitis   . Dilation of biliary tract   . Ovarian cyst   . Lumbar disc herniation   . Vitamin B12 deficiency   . Breast cancer, right ([place])   . Allergy     SEASONAL  . GERD (gastroesophageal reflux disease)   . Osteoporosis     EARLY STAGES  . Cataract     surgery    Past Surgical History  Procedure Laterality Date  . Total vaginal hysterectomy    . Myringotomy      with tympanoplasty and mastoidectomy  . Laparoscopic cholecystectomy    . Cataract extraction Right   . Breast lumpectomy Right   . Tonsillectomy    . Mastectomy partial / lumpectomy      Family History  Problem Relation Age of Onset  . Bone cancer Father   . Leukemia Mother   . Parkinsonism Mother   . Colon cancer Neg Hx   . Stomach cancer Neg Hx       Social History   Social History  . Marital  Status: Married    Spouse Name: N/A  . Number of Children: 2  . Years of Education: N/A   Occupational History  . Retired    Social History Main Topics  . Smoking status: Never Smoker   . Smokeless tobacco: Never Used  . Alcohol Use: No  . Drug Use: No  . Sexual Activity: Not Asked   Other Topics Concern  . None   Social History Narrative   Daily caffeine     Allergies  Allergen Reactions  . Barbiturates   . Codeine   . Droperidol   . Meperidine Hcl   . Sulfonamide Derivatives     REACTION: nausea     Current outpatient prescriptions:  .  Cholecalciferol (VITAMIN D3) 5000 UNITS TABS, Take 1 tablet by mouth daily., Disp: , Rfl:  .  cyanocobalamin (,VITAMIN B-12,) 1000 MCG/ML injection, Inject 1,000 mcg into the muscle every 30 (thirty) days., Disp: , Rfl:  .  denosumab (PROLIA) 60 MG/ML SOLN injection, Inject 60 mg into the skin every 6 (six) months. Administer in upper arm, thigh, or abdomen, Disp: , Rfl:  .  fexofenadine (ALLEGRA) 180 MG tablet, Take 180 mg by mouth daily as  needed. , Disp: , Rfl:  .  guaiFENesin (MUCINEX) 600 MG 12 hr tablet, Take 600 mg by mouth 2 (two) times daily., Disp: , Rfl:  .  ipratropium (ATROVENT) 0.03 % nasal spray, Place 0.3 sprays into the nose as needed. , Disp: , Rfl: 0 .  Probiotic Product (PROBIOTIC PO), Take by mouth daily., Disp: , Rfl:  .  promethazine (PHENERGAN) 25 MG tablet, Take 25 mg by mouth every 6 (six) hours as needed for nausea or vomiting., Disp: , Rfl:  .  Respiratory Therapy Supplies (FLUTTER) DEVI, Use as directed, Disp: 1 each, Rfl: 0 .  Respiratory Therapy Supplies (FLUTTER) DEVI, Use as directed, Disp: 1 each, Rfl: 0 .  Riboflavin (VITAMIN B-2 PO), Take 200 mg by mouth 2 (two) times daily as needed. , Disp: , Rfl:  .  SUMAtriptan (IMITREX) 100 MG tablet, Take 100 mg by mouth as needed., Disp: , Rfl:  .  topiramate (TOPAMAX) 200 MG tablet, Take 200 mg by mouth daily., Disp: , Rfl:  .  zolpidem (AMBIEN) 10 MG tablet,  Take 5 mg by mouth at bedtime as needed. , Disp: , Rfl:  .  azithromycin (ZITHROMAX) 500 MG tablet, Take 1 tablet (500 mg total) by mouth daily., Disp: 30 tablet, Rfl: 11 .  ethambutol (MYAMBUTOL) 400 MG tablet, Take 2 tablets (800 mg total) by mouth daily., Disp: 60 tablet, Rfl: 11 .  rifampin (RIFADIN) 300 MG capsule, Take 2 capsules (600 mg total) by mouth daily., Disp: 60 capsule, Rfl: 11    Review of Systems  Constitutional: Positive for fever, fatigue and unexpected weight change. Negative for chills, diaphoresis, activity change and appetite change.  HENT: Negative for congestion, rhinorrhea, sinus pressure, sneezing, sore throat and trouble swallowing.   Eyes: Negative for photophobia and visual disturbance.  Respiratory: Positive for cough and shortness of breath. Negative for chest tightness, wheezing and stridor.   Cardiovascular: Negative for chest pain, palpitations and leg swelling.  Gastrointestinal: Negative for nausea, vomiting, abdominal pain, diarrhea, constipation, blood in stool, abdominal distention and anal bleeding.  Genitourinary: Negative for dysuria, hematuria, flank pain and difficulty urinating.  Musculoskeletal: Negative for myalgias, back pain, joint swelling, arthralgias and gait problem.  Skin: Negative for color change, pallor, rash and wound.  Neurological: Negative for dizziness, tremors, weakness and light-headedness.  Hematological: Negative for adenopathy. Does not bruise/bleed easily.  Psychiatric/Behavioral: Negative for behavioral problems, confusion, sleep disturbance, dysphoric mood, decreased concentration and agitation.       Objective:   Physical Exam  Constitutional: She is oriented to person, place, and time. She appears well-developed and well-nourished. No distress.  HENT:  Head: Normocephalic and atraumatic.  Mouth/Throat: No oropharyngeal exudate.  Eyes: Conjunctivae and EOM are normal. No scleral icterus.  Neck: Normal range of  motion. Neck supple.  Cardiovascular: Normal rate, regular rhythm and normal heart sounds.  Exam reveals no gallop and no friction rub.   No murmur heard. Pulmonary/Chest: Effort normal. No respiratory distress. She has no wheezes.  Prolonged expiratory phase  Abdominal: She exhibits no distension.  Musculoskeletal: She exhibits no edema or tenderness.  Neurological: She is alert and oriented to person, place, and time. She exhibits normal muscle tone. Coordination normal.  Skin: Skin is warm and dry. No rash noted. She is not diaphoretic. No erythema. No pallor.  Psychiatric: She has a normal mood and affect. Her behavior is normal. Judgment and thought content normal.          Assessment & Plan:  Bronchiectatic and nodular M avium infection  We will start daily :  Azithromycin 500mg  (if difficult to tolereate then drop to 250mg   Ethhambutol 800mg  daily  Rifampin 600mg  daily  Will bring her back in 2-3 months to reasess how she is doing  ATS guidelines would be to treat for a one year FLANKED by negative AFB cultures while on therapy (more like 1.5 years) but given high liklihood of being on this for much longer  I spent greater than 60 minutes with the patient including greater than 50% of time in face to face counsel of the patient re her bronchiectatic , nodular M avium infeciton and in coordination of their care.

## 2014-12-05 ENCOUNTER — Telehealth: Payer: Self-pay | Admitting: Pharmacist Clinician (PhC)/ Clinical Pharmacy Specialist

## 2014-12-05 NOTE — Telephone Encounter (Signed)
[name] called with some questions about her MAC regimen. She was recently prescribed Azith/ETHB/Rif. She was wondering about how to take each. I gave her a plan to eat first thing in the morning>>wait 2 hrs then take all 3 meds>>wait at least an hour to eat again. She agreed to it.

## 2014-12-09 ENCOUNTER — Encounter: Payer: Self-pay | Admitting: Internal Medicine

## 2014-12-09 LAB — AFB CULTURE WITH SMEAR (NOT AT ARMC): ACID FAST SMEAR: NONE SEEN

## 2014-12-20 ENCOUNTER — Ambulatory Visit (INDEPENDENT_AMBULATORY_CARE_PROVIDER_SITE_OTHER): Payer: Medicare Other | Admitting: Pulmonary Disease

## 2014-12-20 ENCOUNTER — Encounter: Payer: Self-pay | Admitting: Pulmonary Disease

## 2014-12-20 VITALS — BP 118/76 | HR 90 | Ht 66.6 in | Wt 115.0 lb

## 2014-12-20 DIAGNOSIS — A310 Pulmonary mycobacterial infection: Secondary | ICD-10-CM | POA: Diagnosis not present

## 2014-12-20 DIAGNOSIS — J479 Bronchiectasis, uncomplicated: Secondary | ICD-10-CM

## 2014-12-20 NOTE — Progress Notes (Signed)
Chief Complaint  Patient presents with  . Follow-up    Pt following for MAI: pt states she was sick and came in to see MW and was treated with antibiotic. pt c/o increase SOB, she cant walk very far without being SOB. no c/o chest tightness wheezing or cough.    History of Present Illness: [name] is a 74 y.o. 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  She was seen by Dr. Tommy Medal on 11/30/14.  Started azithromycin, ethambutol, rifampin.  Her cough has decreased and she is not bringing up as much sputum.  Her weight has stabilized.  She still gets winded and can't walk more than 200 feet w/o having to catch her breath.  TESTS: PFT 12/27/04 >> FEV1 2.34 (100%), FEV1% 56, TLC 6.04 (115%), DLCO 87% Bronchoscopy 02/04/07 >> Aspergillus species  PFT 05/21/12 >> FEV1 2.10 (98%), FEV1% 56%, TLC 4.83 (93%), DLCO 77% Sputum 09/15/14 >> MAI  CT CHEST: 08/29/06 >> 6 mm nodule lingula, 2 mm nodule LLL, BTX in lingula and RML,  01/17/07 >> no change 02/16/10 >> BTX RML/lingula/RLL/LLL 06/01/12 >> progression of tree in bud pattern 09/06/14 >> BTX, increased nodularity, 2 cm nodule RLL, 1.3 cm nodule LLL  Past medical hx >> Migraine headaches, Low back pain, Breast cancer on Rt s/p lumpectomy, Osteoporosis, GERD, Diverticulosis, 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   Physical Exam: BP 118/76 mmHg  Pulse 90  Ht 5' 6.6" (1.692 m)  Wt 115 lb (52.164 kg)  BMI 18.22 kg/m2  SpO2 100%  General - pleasant, thin ENT - No sinus tenderness, no oral exudate, no LAN Cardiac - s1s2 regular, no murmur Chest - no wheeze Back - No focal tenderness Abd - Soft, non-tender Ext - No edema Neuro - Normal strength Skin - No rashes Psych - normal mood, and behavior  Assessment/Plan:  Hx of Mycobacterium avium-intracellulare with increase nodularity concerning for progression/recurrence of MAI. Plan: - 3 drug tx per  ID - will need to discuss f/u CT chest at next visit  COPD with bronchiectasis. She reports lack of response to inhaler therapy previously. Plan: - will defer inhaler therapy - mucinex, flutter valve prn - handicap parking form signed   Chesley Mires, MD [facility] Pager:  [phone]

## 2014-12-20 NOTE — Patient Instructions (Signed)
Follow up in 4 months 

## 2015-02-27 ENCOUNTER — Ambulatory Visit (HOSPITAL_COMMUNITY)
Admission: RE | Admit: 2015-02-27 | Discharge: 2015-02-27 | Disposition: A | Discharge status: Home / Self Care | Payer: Medicare Other | Source: Ambulatory Visit | Attending: Internal Medicine | Admitting: Internal Medicine

## 2015-02-27 ENCOUNTER — Encounter (HOSPITAL_COMMUNITY): Payer: Self-pay

## 2015-02-27 DIAGNOSIS — M810 Age-related osteoporosis without current pathological fracture: Secondary | ICD-10-CM | POA: Insufficient documentation

## 2015-02-27 MED ORDER — DENOSUMAB 60 MG/ML ~~LOC~~ SOLN
60.0000 mg | Freq: Once | SUBCUTANEOUS | Status: AC
  Administered 2015-02-27: 60 mg via SUBCUTANEOUS
  Filled 2015-02-27: qty 1

## 2015-02-27 NOTE — Discharge Instructions (Signed)
Denosumab injection  What is this medicine?  DENOSUMAB (den oh sue mab) slows bone breakdown. Prolia is used to treat osteoporosis in women after menopause and in men. Xgeva is used to prevent bone fractures and other bone problems caused by cancer bone metastases. Xgeva is also used to treat giant cell tumor of the bone.  This medicine may be used for other purposes; ask your health care provider or pharmacist if you have questions.  What should I tell my health care provider before I take this medicine?  They need to know if you have any of these conditions:  -dental disease  -eczema  -infection or history of infections  -kidney disease or on dialysis  -low blood calcium or vitamin D  -malabsorption syndrome  -scheduled to have surgery or tooth extraction  -taking medicine that contains denosumab  -thyroid or parathyroid disease  -an unusual reaction to denosumab, other medicines, foods, dyes, or preservatives  -pregnant or trying to get pregnant  -breast-feeding  How should I use this medicine?  This medicine is for injection under the skin. It is given by a health care professional in a hospital or clinic setting.  If you are getting Prolia, a special MedGuide will be given to you by the pharmacist with each prescription and refill. Be sure to read this information carefully each time.  For Prolia, talk to your pediatrician regarding the use of this medicine in children. Special care may be needed. For Xgeva, talk to your pediatrician regarding the use of this medicine in children. While this drug may be prescribed for children as young as 13 years for selected conditions, precautions do apply.  Overdosage: If you think you have taken too much of this medicine contact a poison control center or emergency room at once.  NOTE: This medicine is only for you. Do not share this medicine with others.  What if I miss a dose?  It is important not to miss your dose. Call your doctor or health care professional if you are  unable to keep an appointment.  What may interact with this medicine?  Do not take this medicine with any of the following medications:  -other medicines containing denosumab  This medicine may also interact with the following medications:  -medicines that suppress the immune system  -medicines that treat cancer  -steroid medicines like prednisone or cortisone  This list may not describe all possible interactions. Give your health care provider a list of all the medicines, herbs, non-prescription drugs, or dietary supplements you use. Also tell them if you smoke, drink alcohol, or use illegal drugs. Some items may interact with your medicine.  What should I watch for while using this medicine?  Visit your doctor or health care professional for regular checks on your progress. Your doctor or health care professional may order blood tests and other tests to see how you are doing.  Call your doctor or health care professional if you get a cold or other infection while receiving this medicine. Do not treat yourself. This medicine may decrease your body's ability to fight infection.  You should make sure you get enough calcium and vitamin D while you are taking this medicine, unless your doctor tells you not to. Discuss the foods you eat and the vitamins you take with your health care professional.  See your dentist regularly. Brush and floss your teeth as directed. Before you have any dental work done, tell your dentist you are receiving this medicine.  Do   not become pregnant while taking this medicine or for 5 months after stopping it. Women should inform their doctor if they wish to become pregnant or think they might be pregnant. There is a potential for serious side effects to an unborn child. Talk to your health care professional or pharmacist for more information.  What side effects may I notice from receiving this medicine?  Side effects that you should report to your doctor or health care professional as soon as  possible:  -allergic reactions like skin rash, itching or hives, swelling of the face, lips, or tongue  -breathing problems  -chest pain  -fast, irregular heartbeat  -feeling faint or lightheaded, falls  -fever, chills, or any other sign of infection  -muscle spasms, tightening, or twitches  -numbness or tingling  -skin blisters or bumps, or is dry, peels, or red  -slow healing or unexplained pain in the mouth or jaw  -unusual bleeding or bruising  Side effects that usually do not require medical attention (Report these to your doctor or health care professional if they continue or are bothersome.):  -muscle pain  -stomach upset, gas  This list may not describe all possible side effects. Call your doctor for medical advice about side effects. You may report side effects to FDA at 1-800-FDA-1088.  Where should I keep my medicine?  This medicine is only given in a clinic, doctor's office, or other health care setting and will not be stored at home.  NOTE: This sheet is a summary. It may not cover all possible information. If you have questions about this medicine, talk to your doctor, pharmacist, or health care provider.      2016, [employer]vier/Gold Standard. (2011-08-05 12:37:47)

## 2015-03-06 ENCOUNTER — Ambulatory Visit (INDEPENDENT_AMBULATORY_CARE_PROVIDER_SITE_OTHER): Payer: Medicare Other | Admitting: Infectious Disease

## 2015-03-06 ENCOUNTER — Encounter: Payer: Self-pay | Admitting: Infectious Disease

## 2015-03-06 VITALS — BP 104/69 | HR 80 | Temp 97.5°F | Wt 120.0 lb

## 2015-03-06 DIAGNOSIS — G252 Other specified forms of tremor: Secondary | ICD-10-CM | POA: Diagnosis not present

## 2015-03-06 DIAGNOSIS — R7989 Other specified abnormal findings of blood chemistry: Secondary | ICD-10-CM | POA: Diagnosis not present

## 2015-03-06 DIAGNOSIS — A310 Pulmonary mycobacterial infection: Secondary | ICD-10-CM

## 2015-03-06 HISTORY — DX: Other specified forms of tremor: G25.2

## 2015-03-06 HISTORY — DX: Other specified abnormal findings of blood chemistry: R79.89

## 2015-03-06 LAB — COMPLETE METABOLIC PANEL WITH GFR
ALT: 8 U/L (ref 6–29)
AST: 12 U/L (ref 10–35)
Albumin: 3.9 g/dL (ref 3.6–5.1)
Alkaline Phosphatase: 45 U/L (ref 33–130)
BUN: 7 mg/dL (ref 7–25)
CHLORIDE: 105 mmol/L (ref 98–110)
CO2: 23 mmol/L (ref 20–31)
CREATININE: 0.69 mg/dL (ref 0.60–0.93)
Calcium: 8.7 mg/dL (ref 8.6–10.4)
GFR, Est African American: >89 mL/min (ref >=60)
GFR, Est Non African American: 85 mL/min (ref >=60)
GLUCOSE: 82 mg/dL (ref 65–99)
POTASSIUM: 4.3 mmol/L (ref 3.5–5.3)
SODIUM: 139 mmol/L (ref 135–146)
Total Bilirubin: 0.5 mg/dL (ref 0.2–1.2)
Total Protein: 5.9 g/dL — ABNORMAL LOW (ref 6.1–8.1)

## 2015-03-06 NOTE — Progress Notes (Signed)
Chief complaint: followup for M avium with nodules and bronchiectatic disease   Subjective:    Patient ID: [name], female    DOB: 1940-07-02, 75 y.o.   MRN: [id number]  HPI   75 year old with nodular, bronchiectatic M avium infection that has previously been treated with Clarithro/ ETHambutol and Rifampin and followed by my partner Dr. Johnnye Sima most recently in 2008 when he also treated her for a MRSA infection of her ear.   Since 2008 the patient has managed to remain off  M avium drugs but in recent months she has not only continued to cough but she has had significant WEIGHT LOSS.   Her recent CT scan in July shows progression of her nodular bronchiectatic lung disease and M avium has been isolated on cultures from sputum in July and now September of 2016.  While she did not always enjoy being on the medicines she realized that when she was beginning to lose weight and have progression of her lung disease, that it was  time to restart M avium drugs.  Since I last saw her she has had improvement in her weight, and reduction in cough but similar DOE.  She has tolerated new once daily regimen much better.   She has minimal cough in am.  Past Medical History  Diagnosis Date  . Bronchiectasis ([place])   . Migraine   . Diverticulosis   . Osteopenia   . Plantar fasciitis   . Dilation of biliary tract   . Ovarian cyst   . Lumbar disc herniation   . Vitamin B12 deficiency   . Breast cancer, right ([place])   . Allergy     SEASONAL  . GERD (gastroesophageal reflux disease)   . Osteoporosis     EARLY STAGES  . Cataract     surgery  . Bronchiectasis ([place]) 11/30/2014  . Weight loss 11/30/2014    Past Surgical History  Procedure Laterality Date  . Total vaginal hysterectomy    . Myringotomy      with tympanoplasty and mastoidectomy  . Laparoscopic cholecystectomy    . Cataract extraction Right   . Breast lumpectomy Right   . Tonsillectomy    . Mastectomy partial /  lumpectomy      Family History  Problem Relation Age of Onset  . Bone cancer Father   . Leukemia Mother   . Parkinsonism Mother   . Colon cancer Neg Hx   . Stomach cancer Neg Hx       Social History   Social History  . Marital Status: Married    Spouse Name: N/A  . Number of Children: 2  . Years of Education: N/A   Occupational History  . Retired    Social History Main Topics  . Smoking status: Never Smoker   . Smokeless tobacco: Never Used  . Alcohol Use: No  . Drug Use: No  . Sexual Activity: Not Asked   Other Topics Concern  . None   Social History Narrative   Daily caffeine     Allergies  Allergen Reactions  . Barbiturates   . Codeine   . Droperidol   . Meperidine Hcl   . Sulfonamide Derivatives     REACTION: nausea     Current outpatient prescriptions:  .  azithromycin (ZITHROMAX) 500 MG tablet, Take 1 tablet (500 mg total) by mouth daily., Disp: 30 tablet, Rfl: 11 .  cyanocobalamin (,VITAMIN B-12,) 1000 MCG/ML injection, Inject 1,000 mcg into the muscle every  30 (thirty) days., Disp: , Rfl:  .  denosumab (PROLIA) 60 MG/ML SOLN injection, Inject 60 mg into the skin every 6 (six) months. Administer in upper arm, thigh, or abdomen, Disp: , Rfl:  .  ethambutol (MYAMBUTOL) 400 MG tablet, Take 2 tablets (800 mg total) by mouth daily., Disp: 60 tablet, Rfl: 11 .  Probiotic Product (PROBIOTIC PO), Take by mouth daily., Disp: , Rfl:  .  promethazine (PHENERGAN) 25 MG tablet, Take 25 mg by mouth every 6 (six) hours as needed for nausea or vomiting., Disp: , Rfl:  .  rifampin (RIFADIN) 300 MG capsule, Take 2 capsules (600 mg total) by mouth daily., Disp: 60 capsule, Rfl: 11 .  SUMAtriptan (IMITREX) 100 MG tablet, Take 100 mg by mouth as needed., Disp: , Rfl:  .  topiramate (TOPAMAX) 200 MG tablet, Take 200 mg by mouth daily., Disp: , Rfl:  .  zolpidem (AMBIEN) 10 MG tablet, Take 5 mg by mouth at bedtime as needed. , Disp: , Rfl:  .  fexofenadine (ALLEGRA) 180 MG  tablet, Take 180 mg by mouth daily as needed. Reported on 03/06/2015, Disp: , Rfl:  .  guaiFENesin (MUCINEX) 600 MG 12 hr tablet, Take 600 mg by mouth 2 (two) times daily. Reported on 03/06/2015, Disp: , Rfl:  .  ipratropium (ATROVENT) 0.03 % nasal spray, Place 0.3 sprays into the nose as needed. Reported on 03/06/2015, Disp: , Rfl: 0 .  Respiratory Therapy Supplies (FLUTTER) DEVI, Use as directed (Patient not taking: Reported on 03/06/2015), Disp: 1 each, Rfl: 0 .  Riboflavin (VITAMIN B-2 PO), Take 200 mg by mouth 2 (two) times daily as needed. Reported on 03/06/2015, Disp: , Rfl:  .  Vitamin D, Ergocalciferol, (DRISDOL) 50000 UNITS CAPS capsule, Take 50,000 Units by mouth every 7 (seven) days. Reported on 03/06/2015, Disp: , Rfl:     Review of Systems  Constitutional: Positive for fever, fatigue and unexpected weight change. Negative for chills, diaphoresis, activity change and appetite change.  HENT: Negative for congestion, rhinorrhea, sinus pressure, sneezing, sore throat and trouble swallowing.   Eyes: Negative for photophobia and visual disturbance.  Respiratory: Positive for cough and shortness of breath. Negative for chest tightness, wheezing and stridor.   Cardiovascular: Negative for chest pain, palpitations and leg swelling.  Gastrointestinal: Negative for nausea, vomiting, abdominal pain, diarrhea, constipation, blood in stool, abdominal distention and anal bleeding.  Genitourinary: Negative for dysuria, hematuria, flank pain and difficulty urinating.  Musculoskeletal: Negative for myalgias, back pain, joint swelling, arthralgias and gait problem.  Skin: Negative for color change, pallor, rash and wound.  Neurological: Negative for dizziness, tremors, weakness and light-headedness.  Hematological: Negative for adenopathy. Does not bruise/bleed easily.  Psychiatric/Behavioral: Negative for behavioral problems, confusion, sleep disturbance, dysphoric mood, decreased concentration and  agitation.       Objective:   Physical Exam  Constitutional: She is oriented to person, place, and time. She appears well-developed and well-nourished. No distress.  HENT:  Head: Normocephalic and atraumatic.  Mouth/Throat: No oropharyngeal exudate.  Eyes: Conjunctivae and EOM are normal. No scleral icterus.  Neck: Normal range of motion. Neck supple.  Cardiovascular: Normal rate, regular rhythm and normal heart sounds.  Exam reveals no gallop and no friction rub.   No murmur heard. Pulmonary/Chest: Effort normal. No respiratory distress. She has no wheezes.  Prolonged expiratory phase  Abdominal: She exhibits no distension.  Musculoskeletal: She exhibits no edema or tenderness.  Neurological: She is alert and oriented to person, place, and time. She  exhibits normal muscle tone. Coordination normal.  Skin: Skin is warm and dry. No rash noted. She is not diaphoretic. No erythema. No pallor.  Psychiatric: She has a normal mood and affect. Her behavior is normal. Judgment and thought content normal.          Assessment & Plan:   Bronchiectatic and nodular M avium infection  Continue:  Azithromycin 500mg  (if difficult to tolereate then drop to 250mg   Ethhambutol 800mg  daily  Rifampin 600mg  daily  I have sent her out with specimen collection for AFB smear and culture to see if we have potential one bookend negative culture now  ATS guidelines would be to treat for a one year FLANKED by negative AFB cultures while on therapy (more like 1.5 years) but given high liklihood of being on this for much longer  I dont see benefit in probiotics but at least ones she is using do not have yeast in them  Intention tremor: fine to resume her inderal  Low Vitamin D: fine to resume vitamin D  I spent greater than 25 minutes with the patient including greater than 50% of time in face to face counsel of the patient re her bronchiectatic , nodular M avium infeciton, her intention tremor,  her low vitamin d and in coordination of her care.

## 2015-04-14 ENCOUNTER — Ambulatory Visit (INDEPENDENT_AMBULATORY_CARE_PROVIDER_SITE_OTHER): Payer: Medicare Other | Admitting: Pulmonary Disease

## 2015-04-14 ENCOUNTER — Encounter: Payer: Self-pay | Admitting: Pulmonary Disease

## 2015-04-14 VITALS — BP 102/60 | HR 77 | Ht 66.5 in | Wt 121.8 lb

## 2015-04-14 DIAGNOSIS — A310 Pulmonary mycobacterial infection: Secondary | ICD-10-CM | POA: Diagnosis not present

## 2015-04-14 DIAGNOSIS — J479 Bronchiectasis, uncomplicated: Secondary | ICD-10-CM | POA: Diagnosis not present

## 2015-04-14 NOTE — Progress Notes (Signed)
Current Outpatient Prescriptions on File Prior to Visit  Medication Sig  . azithromycin (ZITHROMAX) 500 MG tablet Take 1 tablet (500 mg total) by mouth daily.  Marland Kitchen denosumab (PROLIA) 60 MG/ML SOLN injection Inject 60 mg into the skin every 6 (six) months. Administer in upper arm, thigh, or abdomen  . ethambutol (MYAMBUTOL) 400 MG tablet Take 2 tablets (800 mg total) by mouth daily.  Marland Kitchen guaiFENesin (MUCINEX) 600 MG 12 hr tablet Take 600 mg by mouth 2 (two) times daily. Reported on 03/06/2015  . ipratropium (ATROVENT) 0.03 % nasal spray Place 0.3 sprays into the nose as needed. Reported on 03/06/2015  . Probiotic Product (PROBIOTIC PO) Take by mouth daily.  . promethazine (PHENERGAN) 25 MG tablet Take 25 mg by mouth every 6 (six) hours as needed for nausea or vomiting.  Marland Kitchen Respiratory Therapy Supplies (FLUTTER) DEVI Use as directed  . Riboflavin (VITAMIN B-2 PO) Take 200 mg by mouth 2 (two) times daily as needed. Reported on 03/06/2015  . rifampin (RIFADIN) 300 MG capsule Take 2 capsules (600 mg total) by mouth daily.  Marland Kitchen topiramate (TOPAMAX) 200 MG tablet Take 200 mg by mouth daily. Reported on 04/14/2015  . zolpidem (AMBIEN) 10 MG tablet Take 5 mg by mouth at bedtime as needed.   . cyanocobalamin (,VITAMIN B-12,) 1000 MCG/ML injection Inject 1,000 mcg into the muscle every 30 (thirty) days. Reported on 04/14/2015  . fexofenadine (ALLEGRA) 180 MG tablet Take 180 mg by mouth daily as needed. Reported on 04/14/2015  . SUMAtriptan (IMITREX) 100 MG tablet Take 100 mg by mouth as needed. Reported on 04/14/2015   No current facility-administered medications on file prior to visit.    Chief Complaint  Patient presents with  . Follow-up    Saw ID within the last month. Denies any issues of cough. reports that labs were done at visit.     Tests PFT 12/27/04 >> FEV1 2.34 (100%), FEV1% 56, TLC 6.04 (115%), DLCO 87% Bronchoscopy 02/04/07 >> Aspergillus species  PFT 05/21/12 >> FEV1 2.10 (98%), FEV1% 56%, TLC  4.83 (93%), DLCO 77% Sputum 09/15/14 >> MAI  CT CHEST: 08/29/06 >> 6 mm nodule lingula, 2 mm nodule LLL, BTX in lingula and RML,  01/17/07 >> no change 02/16/10 >> BTX RML/lingula/RLL/LLL 06/01/12 >> progression of tree in bud pattern 09/06/14 >> BTX, increased nodularity, 2 cm nodule RLL, 1.3 cm nodule LLL  Past medical history Migraine headaches, Low back pain, Breast cancer on Rt s/p lumpectomy, Osteoporosis, GERD, Diverticulosis, 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  Vital signs BP 102/60 mmHg  Pulse 77  Ht 5' 6.5" (1.689 m)  Wt 121 lb 12.8 oz (55.248 kg)  BMI 19.37 kg/m2  SpO2 98%  History of Present Illness: [name] is a 75 y.o. 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  She remains on tx for MAI.  She is feeling better.  She in not bringing up sputum.  She denies chest pain, fever, or hemoptysis.  She is maintaining her weight better, and feels less fatigued.  She still gets short of breath if she does too much.  Physical Exam:  General - pleasant, thin ENT - No sinus tenderness, no oral exudate, no LAN Cardiac - s1s2 regular, no murmur Chest - no wheeze Back - No focal tenderness Abd - Soft, non-tender Ext - No edema Neuro - Normal strength Skin - No rashes Psych - normal mood, and behavior  Assessment/Plan:  Hx of Mycobacterium avium-intracellulare with increase nodularity concerning for progression/recurrence of MAI >> clinically improved with Tx. Plan: - 3 drug tx per ID - will plan to repeat CT chest after next visit  COPD with bronchiectasis. She reports lack of response to inhaler therapy previously. Plan: - will defer inhaler therapy - mucinex, flutter valve prn   Chesley Mires, MD [facility]/Critical Care/Sleep Pager:  [phone]

## 2015-04-14 NOTE — Patient Instructions (Signed)
Follow up in 4 months 

## 2015-08-02 ENCOUNTER — Encounter: Payer: Self-pay | Admitting: Pulmonary Disease

## 2015-08-02 ENCOUNTER — Ambulatory Visit (INDEPENDENT_AMBULATORY_CARE_PROVIDER_SITE_OTHER): Payer: Medicare Other | Admitting: Pulmonary Disease

## 2015-08-02 ENCOUNTER — Ambulatory Visit: Payer: Medicare Other | Admitting: Pulmonary Disease

## 2015-08-02 VITALS — BP 110/62 | HR 105 | Ht 66.5 in | Wt 123.0 lb

## 2015-08-02 DIAGNOSIS — A310 Pulmonary mycobacterial infection: Secondary | ICD-10-CM | POA: Diagnosis not present

## 2015-08-02 DIAGNOSIS — J471 Bronchiectasis with (acute) exacerbation: Secondary | ICD-10-CM

## 2015-08-02 DIAGNOSIS — J479 Bronchiectasis, uncomplicated: Secondary | ICD-10-CM

## 2015-08-02 DIAGNOSIS — R918 Other nonspecific abnormal finding of lung field: Secondary | ICD-10-CM | POA: Diagnosis not present

## 2015-08-02 NOTE — Patient Instructions (Signed)
Will arrange for CT chest and call with results  Follow up in 6 months 

## 2015-08-02 NOTE — Progress Notes (Signed)
Current Outpatient Prescriptions on File Prior to Visit  Medication Sig  . azithromycin (ZITHROMAX) 500 MG tablet Take 1 tablet (500 mg total) by mouth daily.  Marland Kitchen denosumab (PROLIA) 60 MG/ML SOLN injection Inject 60 mg into the skin every 6 (six) months. Administer in upper arm, thigh, or abdomen  . ethambutol (MYAMBUTOL) 400 MG tablet Take 2 tablets (800 mg total) by mouth daily.  . fexofenadine (ALLEGRA) 180 MG tablet Take 180 mg by mouth daily as needed. Reported on 04/14/2015  . ipratropium (ATROVENT) 0.03 % nasal spray Place 0.3 sprays into the nose as needed. Reported on 03/06/2015  . Probiotic Product (PROBIOTIC PO) Take by mouth daily.  . promethazine (PHENERGAN) 25 MG tablet Take 25 mg by mouth every 6 (six) hours as needed for nausea or vomiting.  Marland Kitchen Respiratory Therapy Supplies (FLUTTER) DEVI Use as directed  . Riboflavin (VITAMIN B-2 PO) Take 200 mg by mouth 2 (two) times daily as needed. Reported on 03/06/2015  . rifampin (RIFADIN) 300 MG capsule Take 2 capsules (600 mg total) by mouth daily.  . SUMAtriptan (IMITREX) 100 MG tablet Take 100 mg by mouth as needed. Reported on 04/14/2015  . topiramate (TOPAMAX) 200 MG tablet Take 200 mg by mouth daily. Reported on 04/14/2015  . cholecalciferol (VITAMIN D) 1000 units tablet Take 1,000 Units by mouth daily. Reported on 08/02/2015  . cyanocobalamin (,VITAMIN B-12,) 1000 MCG/ML injection Inject 1,000 mcg into the muscle every 30 (thirty) days. Reported on 08/02/2015  . guaiFENesin (MUCINEX) 600 MG 12 hr tablet Take 600 mg by mouth 2 (two) times daily. Reported on 08/02/2015  . zolpidem (AMBIEN) 10 MG tablet Take 5 mg by mouth at bedtime as needed. Reported on 08/02/2015   No current facility-administered medications on file prior to visit.    Chief Complaint  Patient presents with  . Follow-up    No change since last OV. Denies any current increased SOB.    Tests PFT 12/27/04 >> FEV1 2.34 (100%), FEV1% 56, TLC 6.04 (115%), DLCO  87% Bronchoscopy 02/04/07 >> Aspergillus species  PFT 05/21/12 >> FEV1 2.10 (98%), FEV1% 56%, TLC 4.83 (93%), DLCO 77% Sputum 09/15/14 >> MAI  CT CHEST: 08/29/06 >> 6 mm nodule lingula, 2 mm nodule LLL, BTX in lingula and RML,  01/17/07 >> no change 02/16/10 >> BTX RML/lingula/RLL/LLL 06/01/12 >> progression of tree in bud pattern 09/06/14 >> BTX, increased nodularity, 2 cm nodule RLL, 1.3 cm nodule LLL  Past medical history Migraine headaches, Low back pain, Breast cancer on Rt s/p lumpectomy, Osteoporosis, GERD, Diverticulosis, 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  Vital signs BP 110/62 mmHg  Pulse 105  Ht 5' 6.5" (1.689 m)  Wt 123 lb (55.792 kg)  BMI 19.56 kg/m2  SpO2 96%  History of Present Illness: [name] is a 75 y.o. 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  She has been doing well.  Not having cough, and not bringing up sputum.  She is maintaining her weight better.  No fever, or hemoptysis.  She has noticed episode of loose stool for past two days, but no abdominal pain.  Physical Exam:  General - pleasant, thin ENT - No sinus tenderness, no oral exudate, no LAN Cardiac - s1s2 regular, no murmur Chest - no wheeze Back - No focal tenderness Abd - Soft, non-tender Ext - No edema Neuro - Normal strength Skin - No rashes Psych - normal mood, and behavior  Assessment/Plan:  Hx of Mycobacterium avium-intracellulare with increase nodularity concerning for progression/recurrence of MAI >> clinically improved with Tx. - 3 drug tx per ID >> she is not able to produce phlegm and hasn't been able to send repeat cultures - repeat CT chest w/o contrast and call her with results  COPD with bronchiectasis. - She reports lack of response to inhaler therapy previously >> defer inhaler therapy - mucinex, flutter valve prn   Patient Instructions  Will arrange for CT chest and call with  results  Follow up in 6 months     Chesley Mires, MD  Pulmonary/Critical Care/Sleep Pager:  [phone] 08/02/2015, 3:19 PM

## 2015-08-09 ENCOUNTER — Ambulatory Visit (INDEPENDENT_AMBULATORY_CARE_PROVIDER_SITE_OTHER)
Admission: RE | Admit: 2015-08-09 | Discharge: 2015-08-09 | Disposition: A | Discharge status: Home / Self Care | Payer: Medicare Other | Source: Ambulatory Visit | Attending: Pulmonary Disease | Admitting: Pulmonary Disease

## 2015-08-09 DIAGNOSIS — A310 Pulmonary mycobacterial infection: Secondary | ICD-10-CM

## 2015-08-09 DIAGNOSIS — J479 Bronchiectasis, uncomplicated: Secondary | ICD-10-CM

## 2015-08-09 DIAGNOSIS — R918 Other nonspecific abnormal finding of lung field: Secondary | ICD-10-CM | POA: Diagnosis not present

## 2015-08-09 DIAGNOSIS — J471 Bronchiectasis with (acute) exacerbation: Secondary | ICD-10-CM

## 2015-08-11 ENCOUNTER — Telehealth: Payer: Self-pay | Admitting: Pulmonary Disease

## 2015-08-11 NOTE — Telephone Encounter (Signed)
CT chest 08/09/15 >> decreased to resolved nodules, no change in BTX   Will have my nurse inform pt that CT chest looks better with decreased appearance of lung nodules indicating response to therapy for MAI.  No change to current tx.

## 2015-08-16 NOTE — Telephone Encounter (Signed)
Patient notified.  No questions or concerns at this time. Nothing further needed.   

## 2015-09-05 ENCOUNTER — Ambulatory Visit (HOSPITAL_COMMUNITY)
Admission: RE | Admit: 2015-09-05 | Discharge: 2015-09-05 | Disposition: A | Discharge status: Home / Self Care | Payer: Medicare Other | Source: Ambulatory Visit | Attending: Internal Medicine | Admitting: Internal Medicine

## 2015-09-20 ENCOUNTER — Ambulatory Visit (HOSPITAL_COMMUNITY)
Admission: RE | Admit: 2015-09-20 | Discharge: 2015-09-20 | Disposition: A | Discharge status: Home / Self Care | Payer: Medicare Other | Source: Ambulatory Visit | Attending: Internal Medicine | Admitting: Internal Medicine

## 2015-09-20 ENCOUNTER — Encounter (HOSPITAL_COMMUNITY): Payer: Self-pay

## 2015-09-20 DIAGNOSIS — M810 Age-related osteoporosis without current pathological fracture: Secondary | ICD-10-CM | POA: Diagnosis not present

## 2015-09-20 MED ORDER — DENOSUMAB 60 MG/ML ~~LOC~~ SOLN
60.0000 mg | Freq: Once | SUBCUTANEOUS | Status: AC
  Administered 2015-09-20: 60 mg via SUBCUTANEOUS
  Filled 2015-09-20: qty 1

## 2015-09-20 NOTE — Progress Notes (Signed)
D/c instruction on prolia given to pt.  prolia SQ given in rt upper arm.  Next appointment for February 7 given to pt also.  Pt d/c ambulatory to lobby.

## 2015-09-20 NOTE — Discharge Instructions (Signed)
Denosumab injection  What is this medicine?  DENOSUMAB (den oh sue mab) slows bone breakdown. Prolia is used to treat osteoporosis in women after menopause and in men. Xgeva is used to prevent bone fractures and other bone problems caused by cancer bone metastases. Xgeva is also used to treat giant cell tumor of the bone.  This medicine may be used for other purposes; ask your health care provider or pharmacist if you have questions.  What should I tell my health care provider before I take this medicine?  They need to know if you have any of these conditions:  -dental disease  -eczema  -infection or history of infections  -kidney disease or on dialysis  -low blood calcium or vitamin D  -malabsorption syndrome  -scheduled to have surgery or tooth extraction  -taking medicine that contains denosumab  -thyroid or parathyroid disease  -an unusual reaction to denosumab, other medicines, foods, dyes, or preservatives  -pregnant or trying to get pregnant  -breast-feeding  How should I use this medicine?  This medicine is for injection under the skin. It is given by a health care professional in a hospital or clinic setting.  If you are getting Prolia, a special MedGuide will be given to you by the pharmacist with each prescription and refill. Be sure to read this information carefully each time.  For Prolia, talk to your pediatrician regarding the use of this medicine in children. Special care may be needed. For Xgeva, talk to your pediatrician regarding the use of this medicine in children. While this drug may be prescribed for children as young as 13 years for selected conditions, precautions do apply.  Overdosage: If you think you have taken too much of this medicine contact a poison control center or emergency room at once.  NOTE: This medicine is only for you. Do not share this medicine with others.  What if I miss a dose?  It is important not to miss your dose. Call your doctor or health care professional if you are  unable to keep an appointment.  What may interact with this medicine?  Do not take this medicine with any of the following medications:  -other medicines containing denosumab  This medicine may also interact with the following medications:  -medicines that suppress the immune system  -medicines that treat cancer  -steroid medicines like prednisone or cortisone  This list may not describe all possible interactions. Give your health care provider a list of all the medicines, herbs, non-prescription drugs, or dietary supplements you use. Also tell them if you smoke, drink alcohol, or use illegal drugs. Some items may interact with your medicine.  What should I watch for while using this medicine?  Visit your doctor or health care professional for regular checks on your progress. Your doctor or health care professional may order blood tests and other tests to see how you are doing.  Call your doctor or health care professional if you get a cold or other infection while receiving this medicine. Do not treat yourself. This medicine may decrease your body's ability to fight infection.  You should make sure you get enough calcium and vitamin D while you are taking this medicine, unless your doctor tells you not to. Discuss the foods you eat and the vitamins you take with your health care professional.  See your dentist regularly. Brush and floss your teeth as directed. Before you have any dental work done, tell your dentist you are receiving this medicine.  Do   not become pregnant while taking this medicine or for 5 months after stopping it. Women should inform their doctor if they wish to become pregnant or think they might be pregnant. There is a potential for serious side effects to an unborn child. Talk to your health care professional or pharmacist for more information.  What side effects may I notice from receiving this medicine?  Side effects that you should report to your doctor or health care professional as soon as  possible:  -allergic reactions like skin rash, itching or hives, swelling of the face, lips, or tongue  -breathing problems  -chest pain  -fast, irregular heartbeat  -feeling faint or lightheaded, falls  -fever, chills, or any other sign of infection  -muscle spasms, tightening, or twitches  -numbness or tingling  -skin blisters or bumps, or is dry, peels, or red  -slow healing or unexplained pain in the mouth or jaw  -unusual bleeding or bruising  Side effects that usually do not require medical attention (Report these to your doctor or health care professional if they continue or are bothersome.):  -muscle pain  -stomach upset, gas  This list may not describe all possible side effects. Call your doctor for medical advice about side effects. You may report side effects to FDA at 1-800-FDA-1088.  Where should I keep my medicine?  This medicine is only given in a clinic, doctor's office, or other health care setting and will not be stored at home.  NOTE: This sheet is a summary. It may not cover all possible information. If you have questions about this medicine, talk to your doctor, pharmacist, or health care provider.      2016, [employer]vier/Gold Standard. (2011-08-05 12:37:47)

## 2015-11-24 ENCOUNTER — Telehealth: Payer: Self-pay | Admitting: *Deleted

## 2015-11-24 MED ORDER — ETHAMBUTOL HCL 400 MG PO TABS: 800.0000 mg | ORAL_TABLET | Freq: Every day | ORAL | 4 refills | Status: DC

## 2015-11-24 MED ORDER — RIFAMPIN 300 MG PO CAPS: 600.0000 mg | ORAL_CAPSULE | Freq: Every day | ORAL | 4 refills | Status: DC

## 2015-11-24 MED ORDER — AZITHROMYCIN 500 MG PO TABS: 500.0000 mg | ORAL_TABLET | Freq: Every day | ORAL | 4 refills | Status: DC

## 2015-11-24 NOTE — Telephone Encounter (Signed)
Patient called to advise she has just gotten her last refill of her MAC medication. She sees Dr Tommy Medal and he told her it would be a year. She is wanting to know if she is now done has it has been a year since starting 11/2014. Advised her not sure but can send the doctor the question and call her back. She also request a follow up appt. Advised will call back once he responds.  Patient states she has been doing well since starting the medication. She has gained 10 pounds and feels fine. She also had an Chest CT this summer which should be in the system.

## 2015-11-24 NOTE — Telephone Encounter (Signed)
Patient made aware of the doctors response and advised she can not cought up any sputum but still has the specimen cup and will try to get something soon. Refilled her medication and advised her to drop it off when she can.

## 2015-11-24 NOTE — Addendum Note (Signed)
Addended by: Janyce Llanos F on: 11/24/2015 12:32 PM   Modules accepted: Orders

## 2015-11-24 NOTE — Telephone Encounter (Signed)
She needs to continue her meds until we get repeat sputa for AFB that are negative. She is welcome to come in and provide a sputum sampel for AFB culture but should continue her meds until we have that culture back --> + 6 weeks and I have been able to reasess her in clinic

## 2015-12-22 ENCOUNTER — Ambulatory Visit (INDEPENDENT_AMBULATORY_CARE_PROVIDER_SITE_OTHER)
Admission: RE | Admit: 2015-12-22 | Discharge: 2015-12-22 | Disposition: A | Discharge status: Home / Self Care | Payer: Medicare Other | Source: Ambulatory Visit | Attending: Pulmonary Disease | Admitting: Pulmonary Disease

## 2015-12-22 ENCOUNTER — Ambulatory Visit (INDEPENDENT_AMBULATORY_CARE_PROVIDER_SITE_OTHER): Payer: Medicare Other | Admitting: Pulmonary Disease

## 2015-12-22 ENCOUNTER — Encounter: Payer: Self-pay | Admitting: Pulmonary Disease

## 2015-12-22 VITALS — BP 122/68 | HR 114 | Ht 66.5 in | Wt 124.8 lb

## 2015-12-22 DIAGNOSIS — R059 Cough, unspecified: Secondary | ICD-10-CM

## 2015-12-22 DIAGNOSIS — A310 Pulmonary mycobacterial infection: Secondary | ICD-10-CM | POA: Diagnosis not present

## 2015-12-22 DIAGNOSIS — R05 Cough: Secondary | ICD-10-CM

## 2015-12-22 MED ORDER — AMOXICILLIN-POT CLAVULANATE 875-125 MG PO TABS: 1.0000 | ORAL_TABLET | Freq: Two times a day (BID) | ORAL | 0 refills | Status: DC

## 2015-12-22 NOTE — Assessment & Plan Note (Signed)
Continue triple therapy

## 2015-12-22 NOTE — Addendum Note (Signed)
Addended by: Benson Setting L on: 12/22/2015 04:29 PM   Modules accepted: Orders

## 2015-12-22 NOTE — Patient Instructions (Signed)
Chest x-ray today Prescription for Augmentin 875 mg twice daily for 7 days-take this only if you have green sputum consistently or for fever

## 2015-12-22 NOTE — Assessment & Plan Note (Signed)
Likely viral exacerbation  Chest x-ray today Prescription for Augmentin 875 mg twice daily for 7 days-take this only if you have green sputum consistently or for fever

## 2015-12-22 NOTE — Progress Notes (Signed)
   Subjective:    Patient ID: [name], female    DOB: 1940-06-26, 75 y.o.   MRN: [id number]  HPI  Hx of Mycobacterium avium-intracellulare with increase nodularity concerning for progression/recurrence of MAI >> clinically improved with Tx.- 3 drug tx per ID   Chief Complaint  Patient presents with  . Acute Visit    VS pt. pt c/o increased sob, prod cough with clear mucus, wheezing, postnasal drip, chills, chest congestion & increased fatigue and weakness X 5d denies any fever, sweats or body aches.    She reports a head cold for 5 days followed by dry cough and minimal clear sputum production No fevers She is to go out of town next week  She is tolerated MAI treatment very well for more than a year-this is her third time   Tests PFT 12/27/04 >> FEV1 2.34 (100%), FEV1% 56, TLC 6.04 (115%), DLCO 87% Bronchoscopy 02/04/07 >> Aspergillus species  PFT 05/21/12 >> FEV1 2.10 (98%), FEV1% 56%, TLC 4.83 (93%), DLCO 77% Sputum 09/15/14 >> MAI  CT chest 08/09/15 >> decreased to resolved nodules, no change in BTX  CT CHEST: 08/29/06 >> 6 mm nodule lingula, 2 mm nodule LLL, BTX in lingula and RML,  01/17/07 >> no change 02/16/10 >> BTX RML/lingula/RLL/LLL 06/01/12 >> progression of tree in bud pattern 09/06/14 >> BTX, increased nodularity, 2 cm nodule RLL, 1.3 cm nodule LLL  Review of Systems neg for any significant sore throat, dysphagia, itching, sneezing, nasal congestion or excess/ purulent secretions, fever, chills, sweats, unintended wt loss, pleuritic or exertional cp, hempoptysis, orthopnea pnd or change in chronic leg swelling.   Also denies presyncope, palpitations, heartburn, abdominal pain, nausea, vomiting, diarrhea or change in bowel or urinary habits, dysuria,hematuria, rash, arthralgias, visual complaints, headache, numbness weakness or ataxia.     Objective:   Physical Exam   Gen. Pleasant, well-nourished, in no distress ENT - no lesions, no post nasal  drip Neck: No JVD, no thyromegaly, no carotid bruits Lungs: no use of accessory muscles, no dullness to percussion, clear without rales or rhonchi  Cardiovascular: Rhythm regular, heart sounds  normal, no murmurs or gallops, no peripheral edema Musculoskeletal: No deformities, no cyanosis or clubbing          Assessment & Plan:

## 2016-01-23 ENCOUNTER — Ambulatory Visit (INDEPENDENT_AMBULATORY_CARE_PROVIDER_SITE_OTHER): Payer: Medicare Other | Admitting: Pulmonary Disease

## 2016-01-23 ENCOUNTER — Encounter: Payer: Self-pay | Admitting: Pulmonary Disease

## 2016-01-23 VITALS — BP 102/78 | HR 95 | Ht 66.5 in | Wt 128.6 lb

## 2016-01-23 DIAGNOSIS — J471 Bronchiectasis with (acute) exacerbation: Secondary | ICD-10-CM | POA: Diagnosis not present

## 2016-01-23 DIAGNOSIS — A310 Pulmonary mycobacterial infection: Secondary | ICD-10-CM | POA: Diagnosis not present

## 2016-01-23 DIAGNOSIS — J479 Bronchiectasis, uncomplicated: Secondary | ICD-10-CM

## 2016-01-23 NOTE — Progress Notes (Signed)
Current Outpatient Prescriptions on File Prior to Visit  Medication Sig  . azithromycin (ZITHROMAX) 500 MG tablet Take 1 tablet (500 mg total) by mouth daily.  . cholecalciferol (VITAMIN D) 1000 units tablet Take 50,000 Units by mouth. Reported on 08/02/2015  . cyanocobalamin (,VITAMIN B-12,) 1000 MCG/ML injection Inject 1,000 mcg into the muscle every 30 (thirty) days. Reported on 08/02/2015  . denosumab (PROLIA) 60 MG/ML SOLN injection Inject 60 mg into the skin every 6 (six) months. Administer in upper arm, thigh, or abdomen  . ethambutol (MYAMBUTOL) 400 MG tablet Take 2 tablets (800 mg total) by mouth daily.  . fexofenadine (ALLEGRA) 180 MG tablet Take 180 mg by mouth daily as needed. Reported on 04/14/2015  . guaiFENesin (MUCINEX) 600 MG 12 hr tablet Take 600 mg by mouth 2 (two) times daily. Reported on 08/02/2015  . Probiotic Product (PROBIOTIC PO) Take by mouth daily.  . promethazine (PHENERGAN) 25 MG tablet Take 25 mg by mouth every 6 (six) hours as needed for nausea or vomiting.  . propranolol (INDERAL) 10 MG tablet Take 10 mg by mouth daily.  Marland Kitchen Respiratory Therapy Supplies (FLUTTER) DEVI Use as directed  . Riboflavin (VITAMIN B-2 PO) Take 200 mg by mouth 2 (two) times daily as needed. Reported on 03/06/2015  . rifampin (RIFADIN) 300 MG capsule Take 2 capsules (600 mg total) by mouth daily.  . SUMAtriptan (IMITREX) 100 MG tablet Take 100 mg by mouth as needed. Reported on 04/14/2015  . topiramate (TOPAMAX) 200 MG tablet Take 200 mg by mouth daily. Reported on 04/14/2015  . zolpidem (AMBIEN) 10 MG tablet Take 5 mg by mouth at bedtime as needed. Reported on 08/02/2015  . amoxicillin-clavulanate (AUGMENTIN) 875-125 MG tablet Take 1 tablet by mouth 2 (two) times daily. (Patient not taking: Reported on 01/23/2016)   No current facility-administered medications on file prior to visit.     Chief Complaint  Patient presents with  . Follow-up    No change since last OV. Pt reports recent lung  infection - seen by Dr Elsworth Soho. Pt sees ID tomorrow for check up    Pulmonary tests PFT 12/27/04 >> FEV1 2.34 (100%), FEV1% 56, TLC 6.04 (115%), DLCO 87% Bronchoscopy 02/04/07 >> Aspergillus species  PFT 05/21/12 >> FEV1 2.10 (98%), FEV1% 56%, TLC 4.83 (93%), DLCO 77% Sputum 09/15/14 >> MAI  CT CHEST: 08/29/06 >> 6 mm nodule lingula, 2 mm nodule LLL, BTX in lingula and RML,  01/17/07 >> no change 02/16/10 >> BTX RML/lingula/RLL/LLL 06/01/12 >> progression of tree in bud pattern 09/06/14 >> BTX, increased nodularity, 2 cm nodule RLL, 1.3 cm nodule LLL 08/09/15 >> decreased to resolved nodules, no change in BTX  Past medical history Migraine headaches, Low back pain, Breast cancer on Rt s/p lumpectomy, Osteoporosis, GERD, Diverticulosis, 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  Vital signs BP 102/78 (BP Location: Left Arm, Cuff Size: Normal)   Pulse 95   Ht 5' 6.5" (1.689 m)   Wt 128 lb 9.6 oz (58.3 kg)   SpO2 98%   BMI 20.45 kg/m   History of Present Illness: [name] is a 75 y.o. 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  She had a URI in November.  She is finally feeling better.  Prior to this she was doing well.  She has been able to regain weight.  She is not having cough, wheeze, sputum, chest pain, sinus congestion, fever, or hemoptysis.  She has f/u with Dr. Tommy Medal tomorrow.  She is worried she won't be able to produce sputum sample to test for presence of MAI.  She still gets winded with activity, but this has been stable for a while.  Physical Exam:  General - pleasant, thin ENT - No sinus tenderness, no oral exudate, no LAN Cardiac - s1s2 regular, no murmur Chest - no wheeze Back - No focal tenderness Abd - Soft, non-tender Ext - No edema Neuro - Normal strength Skin - No rashes Psych - normal mood, and behavior  Assessment/Plan:  Mycobacterium avium-intracellulare. - she has  clinical and radiographic improvement with antibiotic therapy - she has f/u with Dr. Tommy Medal on 01/24/16 - she is not able to produce sputum >> if sputum culture is necessary, then could try induced sputum with respiratory (hasn't been successful in the past), or could arrange for bronchoscopy with BAL  COPD with bronchiectasis. - have given her sample of bevespi two puffs bid >> she is to call if she would like refill on this - mucinex, flutter valve prn   Patient Instructions  Bevespi two puffs twice daily  Call if you want a refill on this  Follow up in 6 months    Chesley Mires, MD [facility] Pager:  [phone] 01/23/2016, 9:56 AM

## 2016-01-23 NOTE — Patient Instructions (Signed)
Bevespi two puffs twice daily  Call if you want a refill on this  Follow up in 6 months

## 2016-01-24 ENCOUNTER — Ambulatory Visit (INDEPENDENT_AMBULATORY_CARE_PROVIDER_SITE_OTHER): Payer: Medicare Other | Admitting: Infectious Disease

## 2016-01-24 ENCOUNTER — Encounter: Payer: Self-pay | Admitting: Infectious Disease

## 2016-01-24 DIAGNOSIS — A310 Pulmonary mycobacterial infection: Secondary | ICD-10-CM

## 2016-01-24 NOTE — Progress Notes (Signed)
Chief complaint: followup for M avium with nodules and bronchiectatic disease   Subjective:   Chief complaint follow-up for Mycobacterium avium infection with chronic cough  Patient ID: [name], female    DOB: 1940-09-10, 75 y.o.   MRN: [id number]  HPI  75 year old with nodular, bronchiectatic M avium infection Early doing very well on her regimen of azithromycin ethambutol and rifampin. Her cough frequency has been reduced dramatically her weight is stabilized. She feels much better. She has not been able to produce sputum so that we could tested for clearance of her AFB. We could make some presumptions about when she might become smear negative and culture negative but at present I think we should consider tinea with her current therapy for another 6 months and then reevaluate whether or not to continue versus stop  Past Medical History:  Diagnosis Date  . Allergy    SEASONAL  . Breast cancer, right ([place])   . Bronchiectasis ([place])   . Bronchiectasis ([place]) 11/30/2014  . Cataract    surgery  . Dilation of biliary tract   . Diverticulosis   . GERD (gastroesophageal reflux disease)   . 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 Low serum vitamin D 03/06/2015  . Lumbar disc herniation   . Migraine   . Osteopenia   . Osteoporosis    EARLY STAGES  . Ovarian cyst   . Plantar fasciitis   . Vitamin B12 deficiency   . Weight loss 11/30/2014    Past Surgical History:  Procedure Laterality Date  . BREAST LUMPECTOMY Right   . CATARACT EXTRACTION Right   . LAPAROSCOPIC CHOLECYSTECTOMY    . MASTECTOMY PARTIAL / LUMPECTOMY    . MYRINGOTOMY     with tympanoplasty and mastoidectomy  . TONSILLECTOMY    . TOTAL VAGINAL HYSTERECTOMY      Family History  Problem Relation Age of Onset  . Bone cancer Father   . Leukemia Mother   . Parkinsonism Mother   . Colon cancer Neg Hx   . Stomach cancer Neg Hx       Social History   Social History  . Marital status: Widowed    Spouse name:  N/A  . Number of children: 2  . Years of education: N/A   Occupational History  . Retired    Social History Main Topics  . Smoking status: Never Smoker  . Smokeless tobacco: Never Used  . Alcohol use No  . Drug use: No  . Sexual activity: Not Asked   Other Topics Concern  . None   Social History Narrative   Daily caffeine     Allergies  Allergen Reactions  . Barbiturates   . Codeine   . Droperidol   . Meperidine Hcl   . Sulfonamide Derivatives     REACTION: nausea     Current Outpatient Prescriptions:  .  azithromycin (ZITHROMAX) 500 MG tablet, Take 1 tablet (500 mg total) by mouth daily., Disp: 30 tablet, Rfl: 4 .  cholecalciferol (VITAMIN D) 1000 units tablet, Take 50,000 Units by mouth. Reported on 08/02/2015, Disp: , Rfl:  .  cyanocobalamin (,VITAMIN B-12,) 1000 MCG/ML injection, Inject 1,000 mcg into the muscle every 30 (thirty) days. Reported on 08/02/2015, Disp: , Rfl:  .  denosumab (PROLIA) 60 MG/ML SOLN injection, Inject 60 mg into the skin every 6 (six) months. Administer in upper arm, thigh, or abdomen, Disp: , Rfl:  .  ethambutol (MYAMBUTOL) 400 MG tablet, Take 2 tablets (800 mg total)  by mouth daily., Disp: 60 tablet, Rfl: 4 .  guaiFENesin (MUCINEX) 600 MG 12 hr tablet, Take 600 mg by mouth 2 (two) times daily. Reported on 08/02/2015, Disp: , Rfl:  .  Probiotic Product (PROBIOTIC PO), Take by mouth daily., Disp: , Rfl:  .  promethazine (PHENERGAN) 25 MG tablet, Take 25 mg by mouth every 6 (six) hours as needed for nausea or vomiting., Disp: , Rfl:  .  propranolol (INDERAL) 10 MG tablet, Take 10 mg by mouth daily., Disp: , Rfl:  .  Respiratory Therapy Supplies (FLUTTER) DEVI, Use as directed, Disp: 1 each, Rfl: 0 .  rifampin (RIFADIN) 300 MG capsule, Take 2 capsules (600 mg total) by mouth daily., Disp: 60 capsule, Rfl: 4 .  SUMAtriptan (IMITREX) 100 MG tablet, Take 100 mg by mouth as needed. Reported on 04/14/2015, Disp: , Rfl:  .  topiramate (TOPAMAX) 200 MG  tablet, Take 200 mg by mouth daily. Reported on 04/14/2015, Disp: , Rfl:  .  amoxicillin-clavulanate (AUGMENTIN) 875-125 MG tablet, Take 1 tablet by mouth 2 (two) times daily. (Patient not taking: Reported on 01/24/2016), Disp: 14 tablet, Rfl: 0 .  fexofenadine (ALLEGRA) 180 MG tablet, Take 180 mg by mouth daily as needed. Reported on 04/14/2015, Disp: , Rfl:  .  Riboflavin (VITAMIN B-2 PO), Take 200 mg by mouth 2 (two) times daily as needed. Reported on 03/06/2015, Disp: , Rfl:  .  zolpidem (AMBIEN) 10 MG tablet, Take 5 mg by mouth at bedtime as needed. Reported on 08/02/2015, Disp: , Rfl:     Review of Systems  Constitutional: Negative for activity change, appetite change, chills, diaphoresis, fatigue, fever and unexpected weight change.  HENT: Negative for congestion, rhinorrhea, sinus pressure, sneezing, sore throat and trouble swallowing.   Eyes: Negative for photophobia and visual disturbance.  Respiratory: Positive for cough. Negative for chest tightness, shortness of breath, wheezing and stridor.   Cardiovascular: Negative for chest pain, palpitations and leg swelling.  Gastrointestinal: Negative for abdominal distention, abdominal pain, anal bleeding, blood in stool, constipation, diarrhea, nausea and vomiting.  Genitourinary: Negative for difficulty urinating, dysuria, flank pain and hematuria.  Musculoskeletal: Negative for arthralgias, back pain, gait problem, joint swelling and myalgias.  Skin: Negative for color change, pallor, rash and wound.  Neurological: Negative for dizziness, tremors, weakness and light-headedness.  Hematological: Negative for adenopathy. Does not bruise/bleed easily.  Psychiatric/Behavioral: Negative for agitation, behavioral problems, confusion, decreased concentration, dysphoric mood and sleep disturbance.       Objective:   Physical Exam  Constitutional: She is oriented to person, place, and time. She appears well-developed and well-nourished. No  distress.  HENT:  Head: Normocephalic and atraumatic.  Mouth/Throat: No oropharyngeal exudate.  Eyes: Conjunctivae and EOM are normal. No scleral icterus.  Neck: Normal range of motion. Neck supple.  Cardiovascular: Normal rate, regular rhythm and normal heart sounds.  Exam reveals no gallop and no friction rub.   No murmur heard. Pulmonary/Chest: Effort normal. No respiratory distress. She has no wheezes.  Prolonged expiratory phase  Abdominal: She exhibits no distension.  Musculoskeletal: She exhibits no edema or tenderness.  Neurological: She is alert and oriented to person, place, and time. She exhibits normal muscle tone. Coordination normal.  Skin: Skin is warm and dry. No rash noted. She is not diaphoretic. No erythema. No pallor.  Psychiatric: She has a normal mood and affect. Her behavior is normal. Judgment and thought content normal.          Assessment & Plan:  Bronchiectatic and nodular M avium infection  Continue:  Azithromycin 500mg  (if difficult to tolereate then drop to 250mg   Ethhambutol 800mg  daily  Rifampin 600mg  daily  The ATS guidelines would be to treat for a one year FLANKED by negative AFB cultures while on therapy (more like 1.5 years) but given high liklihood of being on this for much longer  She has been unable to produce sputum shortly after going on therapy so will need to make some assumptions about whether she became AFB culture negative and when but for now I think having gone through more than a year therapy we could consider stopping but I think given how good she looks and how well she is tolerating the medicines I would prefer to continue with them and reassess in 6 months.

## 2016-03-27 ENCOUNTER — Encounter (HOSPITAL_COMMUNITY): Payer: Self-pay

## 2016-03-27 ENCOUNTER — Ambulatory Visit (HOSPITAL_COMMUNITY)
Admission: RE | Admit: 2016-03-27 | Discharge: 2016-03-27 | Disposition: A | Discharge status: Home / Self Care | Payer: Medicare Other | Source: Ambulatory Visit | Attending: Internal Medicine | Admitting: Internal Medicine

## 2016-03-27 DIAGNOSIS — M810 Age-related osteoporosis without current pathological fracture: Secondary | ICD-10-CM | POA: Diagnosis not present

## 2016-03-27 HISTORY — DX: Personal history of irradiation: Z92.3

## 2016-03-27 HISTORY — DX: Dyspnea, unspecified: R06.00

## 2016-03-27 HISTORY — DX: Pneumonia, unspecified organism: J18.9

## 2016-03-27 MED ORDER — DENOSUMAB 60 MG/ML ~~LOC~~ SOLN
60.0000 mg | Freq: Once | SUBCUTANEOUS | Status: AC
  Administered 2016-03-27: 60 mg via SUBCUTANEOUS
  Filled 2016-03-27: qty 1

## 2016-03-27 NOTE — Progress Notes (Signed)
Discharge instruction sheets given to pt in regards to Prolia. Prolia given SubQ per right upper arm. Pt tolerated well. Next Appointment scheduled for 09/25/2016. Pt discharged ambulatory to [facility].

## 2016-03-27 NOTE — Discharge Instructions (Signed)
Denosumab injection What is this medicine? DENOSUMAB (den oh sue mab) slows bone breakdown. Prolia is used to treat osteoporosis in women after menopause and in men. Xgeva is used to prevent bone fractures and other bone problems caused by cancer bone metastases. Xgeva is also used to treat giant cell tumor of the bone. COMMON BRAND NAME(S): Prolia, XGEVA What should I tell my health care provider before I take this medicine? They need to know if you have any of these conditions: -dental disease -eczema -infection or history of infections -kidney disease or on dialysis -low blood calcium or vitamin D -malabsorption syndrome -scheduled to have surgery or tooth extraction -taking medicine that contains denosumab -thyroid or parathyroid disease -an unusual reaction to denosumab, other medicines, foods, dyes, or preservatives -pregnant or trying to get pregnant -breast-feeding How should I use this medicine? This medicine is for injection under the skin. It is given by a health care professional in a hospital or clinic setting. If you are getting Prolia, a special MedGuide will be given to you by the pharmacist with each prescription and refill. Be sure to read this information carefully each time. For Prolia, talk to your pediatrician regarding the use of this medicine in children. Special care may be needed. For Xgeva, talk to your pediatrician regarding the use of this medicine in children. While this drug may be prescribed for children as young as 13 years for selected conditions, precautions do apply. What if I miss a dose? It is important not to miss your dose. Call your doctor or health care professional if you are unable to keep an appointment. What may interact with this medicine? Do not take this medicine with any of the following medications: -other medicines containing denosumab This medicine may also interact with the following medications: -medicines that suppress the immune  system -medicines that treat cancer -steroid medicines like prednisone or cortisone What should I watch for while using this medicine? Visit your doctor or health care professional for regular checks on your progress. Your doctor or health care professional may order blood tests and other tests to see how you are doing. Call your doctor or health care professional if you get a cold or other infection while receiving this medicine. Do not treat yourself. This medicine may decrease your body's ability to fight infection. You should make sure you get enough calcium and vitamin D while you are taking this medicine, unless your doctor tells you not to. Discuss the foods you eat and the vitamins you take with your health care professional. See your dentist regularly. Brush and floss your teeth as directed. Before you have any dental work done, tell your dentist you are receiving this medicine. Do not become pregnant while taking this medicine or for 5 months after stopping it. Women should inform their doctor if they wish to become pregnant or think they might be pregnant. There is a potential for serious side effects to an unborn child. Talk to your health care professional or pharmacist for more information. What side effects may I notice from receiving this medicine? Side effects that you should report to your doctor or health care professional as soon as possible: -allergic reactions like skin rash, itching or hives, swelling of the face, lips, or tongue -breathing problems -chest pain -fast, irregular heartbeat -feeling faint or lightheaded, falls -fever, chills, or any other sign of infection -muscle spasms, tightening, or twitches -numbness or tingling -skin blisters or bumps, or is dry, peels, or red -slow   healing or unexplained pain in the mouth or jaw -unusual bleeding or bruising Side effects that usually do not require medical attention (report to your doctor or health care professional  if they continue or are bothersome): -muscle pain -stomach upset, gas Where should I keep my medicine? This medicine is only given in a clinic, doctor's office, or other health care setting and will not be stored at home.  2017 Elsevier/Gold Standard (2015-03-09 10:06:55)  

## 2016-05-07 ENCOUNTER — Other Ambulatory Visit: Payer: Self-pay | Admitting: Infectious Disease

## 2016-05-07 MED ORDER — RIFAMPIN 300 MG PO CAPS: 600.0000 mg | ORAL_CAPSULE | Freq: Every day | ORAL | 2 refills | Status: DC

## 2016-07-22 ENCOUNTER — Ambulatory Visit (INDEPENDENT_AMBULATORY_CARE_PROVIDER_SITE_OTHER): Payer: Medicare Other | Admitting: Infectious Disease

## 2016-07-22 ENCOUNTER — Encounter: Payer: Self-pay | Admitting: Infectious Disease

## 2016-07-22 VITALS — BP 122/78 | HR 81 | Temp 97.5°F | Ht 66.5 in | Wt 124.0 lb

## 2016-07-22 DIAGNOSIS — J479 Bronchiectasis, uncomplicated: Secondary | ICD-10-CM

## 2016-07-22 DIAGNOSIS — A310 Pulmonary mycobacterial infection: Secondary | ICD-10-CM | POA: Diagnosis not present

## 2016-07-22 DIAGNOSIS — R634 Abnormal weight loss: Secondary | ICD-10-CM | POA: Diagnosis not present

## 2016-07-22 LAB — COMPLETE METABOLIC PANEL WITH GFR
ALT: 7 U/L (ref 6–29)
AST: 11 U/L (ref 10–35)
Albumin: 4 g/dL (ref 3.6–5.1)
Alkaline Phosphatase: 43 U/L (ref 33–130)
BILIRUBIN TOTAL: 0.5 mg/dL (ref 0.2–1.2)
BUN: 7 mg/dL (ref 7–25)
CHLORIDE: 108 mmol/L (ref 98–110)
CO2: 23 mmol/L (ref 20–31)
Calcium: 8.8 mg/dL (ref 8.6–10.4)
Creat: 0.72 mg/dL (ref 0.60–0.93)
GFR, EST NON AFRICAN AMERICAN: 82 mL/min (ref >=60)
Glucose, Bld: 97 mg/dL (ref 65–99)
POTASSIUM: 4.9 mmol/L (ref 3.5–5.3)
Sodium: 140 mmol/L (ref 135–146)
TOTAL PROTEIN: 6.3 g/dL (ref 6.1–8.1)

## 2016-07-22 MED ORDER — AZITHROMYCIN 500 MG PO TABS: 500.0000 mg | ORAL_TABLET | Freq: Every day | ORAL | 11 refills | Status: DC

## 2016-07-22 MED ORDER — ETHAMBUTOL HCL 400 MG PO TABS: 800.0000 mg | ORAL_TABLET | Freq: Every day | ORAL | 11 refills | Status: DC

## 2016-07-22 MED ORDER — RIFAMPIN 300 MG PO CAPS: 600.0000 mg | ORAL_CAPSULE | Freq: Every day | ORAL | 11 refills | Status: DC

## 2016-07-22 NOTE — Progress Notes (Signed)
Chief complaint: followup for M avium with nodules and bronchiectatic disease   Subjective:   Chief complaint follow-up for Mycobacterium avium infection with chronic cough  Patient ID: [name], female    DOB: 01/30/1941, 76 y.o.   MRN: [id number]  HPI  76 year old with nodular, bronchiectatic M avium infection Early doing very well on her regimen of azithromycin ethambutol and rifampin. Her cough frequency has been reduced dramatically her weight is stabilized. She feels much better. She has not been able to produce sputum so that we could tested for clearance of her AFB. We could make some presumptions about when she might become smear negative and culture negative but at present I think we should consider tinea with her current therapy for another 6 months and then reevaluate whether or not to continue versus stop  Past Medical History:  Diagnosis Date  . Allergy    SEASONAL  . Breast cancer, right ([place])   . Bronchiectasis ([place])   . Bronchiectasis ([place]) 11/30/2014  . Cataract    surgery  . Dilation of biliary tract   . Diverticulosis   . Dyspnea    with exertion   . GERD (gastroesophageal reflux disease)   . History of radiation therapy   . 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 Low serum vitamin D 03/06/2015  . Lumbar disc herniation   . Migraine   . Osteopenia   . Osteoporosis    EARLY STAGES  . Ovarian cyst   . Plantar fasciitis   . Pneumonia   . Vitamin B12 deficiency   . Weight loss 11/30/2014    Past Surgical History:  Procedure Laterality Date  . ABDOMINAL HYSTERECTOMY    . BREAST LUMPECTOMY Right   . CATARACT EXTRACTION Right   . LAPAROSCOPIC CHOLECYSTECTOMY    . MASTECTOMY PARTIAL / LUMPECTOMY    . MYRINGOTOMY     with tympanoplasty and mastoidectomy  . TONSILLECTOMY    . TOTAL VAGINAL HYSTERECTOMY      Family History  Problem Relation Age of Onset  . Bone cancer Father   . Leukemia Mother   . Parkinsonism Mother   . Colon cancer Neg Hx   .  Stomach cancer Neg Hx       Social History   Social History  . Marital status: Widowed    Spouse name: N/A  . Number of children: 2  . Years of education: N/A   Occupational History  . Retired    Social History Main Topics  . Smoking status: Never Smoker  . Smokeless tobacco: Never Used  . Alcohol use No  . Drug use: No  . Sexual activity: Not Asked   Other Topics Concern  . None   Social History Narrative   Daily caffeine     Allergies  Allergen Reactions  . Barbiturates   . Codeine   . Droperidol   . Meperidine Hcl   . Phenobarbital   . Sulfonamide Derivatives     REACTION: nausea     Current Outpatient Prescriptions:  .  azithromycin (ZITHROMAX) 500 MG tablet, TAKE 1 TABLET EVERY DAY, Disp: 30 tablet, Rfl: 2 .  cholecalciferol (VITAMIN D) 1000 units tablet, Take 50,000 Units by mouth. Reported on 08/02/2015, Disp: , Rfl:  .  cyanocobalamin 1000 MCG tablet, Take 1,000 mcg by mouth daily., Disp: , Rfl:  .  denosumab (PROLIA) 60 MG/ML SOLN injection, Inject 60 mg into the skin every 6 (six) months. Administer in upper arm, thigh, or abdomen,  Disp: , Rfl:  .  ethambutol (MYAMBUTOL) 400 MG tablet, TAKE 2 TABLETS (800 MG TOTAL) BY MOUTH DAILY., Disp: 60 tablet, Rfl: 2 .  fexofenadine (ALLEGRA) 180 MG tablet, Take 180 mg by mouth daily as needed. Reported on 04/14/2015, Disp: , Rfl:  .  guaiFENesin (MUCINEX) 600 MG 12 hr tablet, Take 600 mg by mouth 2 (two) times daily. Reported on 08/02/2015, Disp: , Rfl:  .  promethazine (PHENERGAN) 25 MG tablet, Take 25 mg by mouth every 6 (six) hours as needed for nausea or vomiting., Disp: , Rfl:  .  propranolol (INDERAL) 10 MG tablet, Take 10 mg by mouth daily., Disp: , Rfl:  .  Respiratory Therapy Supplies (FLUTTER) DEVI, Use as directed, Disp: 1 each, Rfl: 0 .  rifampin (RIFADIN) 300 MG capsule, Take 2 capsules (600 mg total) by mouth daily., Disp: 60 capsule, Rfl: 2 .  SUMAtriptan (IMITREX) 100 MG tablet, Take 100 mg by mouth as  needed. Reported on 04/14/2015, Disp: , Rfl:  .  topiramate (TOPAMAX) 200 MG tablet, Take 200 mg by mouth daily. Reported on 04/14/2015, Disp: , Rfl:  .  zolpidem (AMBIEN) 10 MG tablet, Take 5 mg by mouth at bedtime as needed. Reported on 08/02/2015, Disp: , Rfl:  .  amoxicillin-clavulanate (AUGMENTIN) 875-125 MG tablet, Take 1 tablet by mouth 2 (two) times daily. (Patient not taking: Reported on 07/22/2016), Disp: 14 tablet, Rfl: 0 .  Probiotic Product (PROBIOTIC PO), Take by mouth daily., Disp: , Rfl:     Review of Systems  Constitutional: Negative for activity change, appetite change, chills, diaphoresis, fatigue, fever and unexpected weight change.  HENT: Negative for congestion, rhinorrhea, sinus pressure, sneezing, sore throat and trouble swallowing.   Eyes: Negative for photophobia and visual disturbance.  Respiratory: Positive for cough. Negative for chest tightness, shortness of breath, wheezing and stridor.   Cardiovascular: Negative for chest pain, palpitations and leg swelling.  Gastrointestinal: Negative for abdominal distention, abdominal pain, anal bleeding, blood in stool, constipation, diarrhea, nausea and vomiting.  Genitourinary: Negative for difficulty urinating, dysuria, flank pain and hematuria.  Musculoskeletal: Negative for arthralgias, back pain, gait problem, joint swelling and myalgias.  Skin: Negative for color change, pallor, rash and wound.  Neurological: Negative for dizziness, tremors, weakness and light-headedness.  Hematological: Negative for adenopathy. Does not bruise/bleed easily.  Psychiatric/Behavioral: Negative for agitation, behavioral problems, confusion, decreased concentration, dysphoric mood and sleep disturbance.       Objective:   Physical Exam  Constitutional: She is oriented to person, place, and time. She appears well-developed and well-nourished. No distress.  HENT:  Head: Normocephalic and atraumatic.  Mouth/Throat: No oropharyngeal exudate.   Eyes: Conjunctivae and EOM are normal. No scleral icterus.  Neck: Normal range of motion. Neck supple.  Cardiovascular: Normal rate, regular rhythm and normal heart sounds.  Exam reveals no gallop and no friction rub.   No murmur heard. Pulmonary/Chest: Effort normal. No respiratory distress. She has no wheezes.  Prolonged expiratory phase  Abdominal: She exhibits no distension.  Musculoskeletal: She exhibits no edema or tenderness.  Neurological: She is alert and oriented to person, place, and time. She exhibits normal muscle tone. Coordination normal.  Skin: Skin is warm and dry. No rash noted. She is not diaphoretic. No erythema. No pallor.  Psychiatric: She has a normal mood and affect. Her behavior is normal. Judgment and thought content normal.          Assessment & Plan:   Bronchiectatic and nodular M avium infection  Continue:  Azithromycin 500mg  (if difficult to tolereate then drop to 250mg   Ethhambutol 800mg  daily  Rifampin 600mg  daily

## 2016-08-23 ENCOUNTER — Ambulatory Visit (INDEPENDENT_AMBULATORY_CARE_PROVIDER_SITE_OTHER): Payer: Medicare Other | Admitting: Pulmonary Disease

## 2016-08-23 ENCOUNTER — Encounter: Payer: Self-pay | Admitting: Pulmonary Disease

## 2016-08-23 VITALS — BP 102/76 | HR 98 | Ht 66.5 in | Wt 123.8 lb

## 2016-08-23 DIAGNOSIS — J479 Bronchiectasis, uncomplicated: Secondary | ICD-10-CM | POA: Diagnosis not present

## 2016-08-23 DIAGNOSIS — A310 Pulmonary mycobacterial infection: Secondary | ICD-10-CM | POA: Diagnosis not present

## 2016-08-23 NOTE — Progress Notes (Signed)
Current Outpatient Prescriptions on File Prior to Visit  Medication Sig  . azithromycin (ZITHROMAX) 500 MG tablet Take 1 tablet (500 mg total) by mouth daily.  . cholecalciferol (VITAMIN D) 1000 units tablet Take 50,000 Units by mouth. Reported on 08/02/2015  . cyanocobalamin 1000 MCG tablet Take 1,000 mcg by mouth daily.  Marland Kitchen denosumab (PROLIA) 60 MG/ML SOLN injection Inject 60 mg into the skin every 6 (six) months. Administer in upper arm, thigh, or abdomen  . ethambutol (MYAMBUTOL) 400 MG tablet Take 2 tablets (800 mg total) by mouth daily.  . fexofenadine (ALLEGRA) 180 MG tablet Take 180 mg by mouth daily as needed. Reported on 04/14/2015  . guaiFENesin (MUCINEX) 600 MG 12 hr tablet Take 600 mg by mouth 2 (two) times daily. Reported on 08/02/2015  . promethazine (PHENERGAN) 25 MG tablet Take 25 mg by mouth every 6 (six) hours as needed for nausea or vomiting.  . propranolol (INDERAL) 10 MG tablet Take 10 mg by mouth daily.  Marland Kitchen Respiratory Therapy Supplies (FLUTTER) DEVI Use as directed  . rifampin (RIFADIN) 300 MG capsule Take 2 capsules (600 mg total) by mouth daily.  . SUMAtriptan (IMITREX) 100 MG tablet Take 100 mg by mouth as needed. Reported on 04/14/2015  . topiramate (TOPAMAX) 200 MG tablet Take 200 mg by mouth daily. Reported on 04/14/2015  . Probiotic Product (PROBIOTIC PO) Take by mouth daily.  Marland Kitchen zolpidem (AMBIEN) 10 MG tablet Take 5 mg by mouth at bedtime as needed. Reported on 08/02/2015   No current facility-administered medications on file prior to visit.     Chief Complaint  Patient presents with  . Follow-up    Pt reports no new symptoms -- SOB is the same. Pt is keeping up with appts with ID, had recent bloodwork. Still taking the 3 abx for her MAI -- unable to get a sputum culture up to test.     Pulmonary tests PFT 12/27/04 >> FEV1 2.34 (100%), FEV1% 56, TLC 6.04 (115%), DLCO 87% Bronchoscopy 02/04/07 >> Aspergillus species  PFT 05/21/12 >> FEV1 2.10 (98%), FEV1% 56%, TLC  4.83 (93%), DLCO 77% Sputum 09/15/14 >> MAI  CT CHEST: 08/29/06 >> 6 mm nodule lingula, 2 mm nodule LLL, BTX in lingula and RML,  01/17/07 >> no change 02/16/10 >> BTX RML/lingula/RLL/LLL 06/01/12 >> progression of tree in bud pattern 09/06/14 >> BTX, increased nodularity, 2 cm nodule RLL, 1.3 cm nodule LLL 08/09/15 >> decreased to resolved nodules, no change in BTX  Past medical history Migraine headaches, Low back pain, Breast cancer on Rt s/p lumpectomy, Osteoporosis, GERD, Diverticulosis, 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  Vital signs BP 102/76 (BP Location: Left Arm, Cuff Size: Normal)   Pulse 98   Ht 5' 6.5" (1.689 m)   Wt 123 lb 12.8 oz (56.2 kg)   SpO2 94%   BMI 19.68 kg/m   History of Present Illness: [name] is a 76 y.o. 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  She was seen by Dr. Tommy Medal in June.  She is to continue on 3 drug tx.  She is not having cough, wheeze, sputum, hemoptysis, fever, sweats.  Maintaining her weight.  She gets winded walking up hills, but otherwise does okay with activity.  She uses mucinex at night.  Tried bevespi >> no help.  She has eye doctor appointment later this month.  Physical Exam:  General - pleasant Eyes - pupils  reactive ENT - no sinus tenderness, no oral exudate, no LAN Cardiac - regular, no murmur Chest - no wheeze, rales Abd - soft, non tender Ext - no edema Skin - no rashes Neuro - normal strength Psych - normal mood   CMP Latest Ref Rng & Units 07/22/2016 03/06/2015 05/04/2007  Glucose 65 - 99 mg/dL 97 82 102(H)  BUN 7 - 25 mg/dL 7 7 11   Creatinine 0.60 - 0.93 mg/dL 0.72 0.69 0.75  Sodium 135 - 146 mmol/L 140 139 141  Potassium 3.5 - 5.3 mmol/L 4.9 4.3 3.5  Chloride 98 - 110 mmol/L 108 105 106  CO2 20 - 31 mmol/L 23 23 24   Calcium 8.6 - 10.4 mg/dL 8.8 8.7 9.3  Total Protein 6.1 - 8.1 g/dL 6.3 5.9(L) 6.7  Total Bilirubin 0.2 -  1.2 mg/dL 0.5 0.5 0.6  Alkaline Phos 33 - 130 U/L 43 45 115  AST 10 - 35 U/L 11 12 13   ALT 6 - 29 U/L 7 8 11      Assessment/Plan:  Mycobacterium avium-intracellulare. - she has clinical and radiographic improvement with antibiotic therapy - she is on 3 drug tx with ID  COPD with bronchiectasis. - no improvement with trials of different inhalers - continue bronchial hygiene with mucinex and flutter valve prn   Patient Instructions  Follow up in 1 year    Chesley Mires, MD [place] Pager:  [phone] 08/23/2016, 9:52 AM

## 2016-08-23 NOTE — Patient Instructions (Signed)
Follow up in 1 year.

## 2016-08-27 NOTE — Addendum Note (Signed)
Addended by: Chesley Mires on: 08/27/2016 11:26 AM   Modules accepted: Level of Service

## 2016-09-25 ENCOUNTER — Encounter (HOSPITAL_COMMUNITY): Payer: Self-pay

## 2016-09-25 ENCOUNTER — Ambulatory Visit (HOSPITAL_COMMUNITY)
Admission: RE | Admit: 2016-09-25 | Discharge: 2016-09-25 | Disposition: A | Discharge status: Home / Self Care | Payer: Medicare Other | Source: Ambulatory Visit | Attending: Internal Medicine | Admitting: Internal Medicine

## 2016-09-25 DIAGNOSIS — M810 Age-related osteoporosis without current pathological fracture: Secondary | ICD-10-CM | POA: Insufficient documentation

## 2016-09-25 MED ORDER — DENOSUMAB 60 MG/ML ~~LOC~~ SOLN
60.0000 mg | Freq: Once | SUBCUTANEOUS | Status: AC
  Administered 2016-09-25: 60 mg via SUBCUTANEOUS
  Filled 2016-09-25: qty 1

## 2016-09-25 NOTE — Discharge Instructions (Signed)
Prolia Denosumab injection What is this medicine? DENOSUMAB (den oh sue mab) slows bone breakdown. Prolia is used to treat osteoporosis in women after menopause and in men. Delton See is used to treat a high calcium level due to cancer and to prevent bone fractures and other bone problems caused by multiple myeloma or cancer bone metastases. Delton See is also used to treat giant cell tumor of the bone. This medicine may be used for other purposes; ask your health care provider or pharmacist if you have questions. COMMON BRAND NAME(S): Prolia, XGEVA What should I tell my health care provider before I take this medicine? They need to know if you have any of these conditions: -dental disease -having surgery or tooth extraction -infection -kidney disease -low levels of calcium or Vitamin D in the blood -malnutrition -on hemodialysis -skin conditions or sensitivity -thyroid or parathyroid disease -an unusual reaction to denosumab, other medicines, foods, dyes, or preservatives -pregnant or trying to get pregnant -breast-feeding How should I use this medicine? This medicine is for injection under the skin. It is given by a health care professional in a hospital or clinic setting. If you are getting Prolia, a special MedGuide will be given to you by the pharmacist with each prescription and refill. Be sure to read this information carefully each time. For Prolia, talk to your pediatrician regarding the use of this medicine in children. Special care may be needed. For Delton See, talk to your pediatrician regarding the use of this medicine in children. While this drug may be prescribed for children as young as 13 years for selected conditions, precautions do apply. Overdosage: If you think you have taken too much of this medicine contact a poison control center or emergency room at once. NOTE: This medicine is only for you. Do not share this medicine with others. What if I miss a dose? It is important not  to miss your dose. Call your doctor or health care professional if you are unable to keep an appointment. What may interact with this medicine? Do not take this medicine with any of the following medications: -other medicines containing denosumab This medicine may also interact with the following medications: -medicines that lower your chance of fighting infection -steroid medicines like prednisone or cortisone This list may not describe all possible interactions. Give your health care provider a list of all the medicines, herbs, non-prescription drugs, or dietary supplements you use. Also tell them if you smoke, drink alcohol, or use illegal drugs. Some items may interact with your medicine. What should I watch for while using this medicine? Visit your doctor or health care professional for regular checks on your progress. Your doctor or health care professional may order blood tests and other tests to see how you are doing. Call your doctor or health care professional for advice if you get a fever, chills or sore throat, or other symptoms of a cold or flu. Do not treat yourself. This drug may decrease your body's ability to fight infection. Try to avoid being around people who are sick. You should make sure you get enough calcium and vitamin D while you are taking this medicine, unless your doctor tells you not to. Discuss the foods you eat and the vitamins you take with your health care professional. See your dentist regularly. Brush and floss your teeth as directed. Before you have any dental work done, tell your dentist you are receiving this medicine. Do not become pregnant while taking this medicine or for  months after stopping it. Talk with your doctor or health care professional about your birth control options while taking this medicine. Women should inform their doctor if they wish to become pregnant or think they might be pregnant. There is a potential for serious side effects to an unborn  child. Talk to your health care professional or pharmacist for more information. °What side effects may I notice from receiving this medicine? °Side effects that you should report to your doctor or health care professional as soon as possible: °-allergic reactions like skin rash, itching or hives, swelling of the face, lips, or tongue °-bone pain °-breathing problems °-dizziness °-jaw pain, especially after dental work °-redness, blistering, peeling of the skin °-signs and symptoms of infection like fever or chills; cough; sore throat; pain or trouble passing urine °-signs of low calcium like fast heartbeat, muscle cramps or muscle pain; pain, tingling, numbness in the hands or feet; seizures °-unusual bleeding or bruising °-unusually weak or tired °Side effects that usually do not require medical attention (report to your doctor or health care professional if they continue or are bothersome): °-constipation °-diarrhea °-headache °-joint pain °-loss of appetite °-muscle pain °-runny nose °-tiredness °-upset stomach °This list may not describe all possible side effects. Call your doctor for medical advice about side effects. You may report side effects to FDA at 1-800-FDA-1088. °Where should I keep my medicine? °This medicine is only given in a clinic, doctor's office, or other health care setting and will not be stored at home. °NOTE: This sheet is a summary. It may not cover all possible information. If you have questions about this medicine, talk to your doctor, pharmacist, or health care provider. °© 2018 [employer] (2016-02-27 19:17:21) ° °

## 2017-04-01 ENCOUNTER — Ambulatory Visit (HOSPITAL_COMMUNITY)
Admission: RE | Admit: 2017-04-01 | Discharge: 2017-04-01 | Disposition: A | Discharge status: Home / Self Care | Payer: Medicare Other | Source: Ambulatory Visit | Attending: Internal Medicine | Admitting: Internal Medicine

## 2017-04-01 ENCOUNTER — Encounter (HOSPITAL_COMMUNITY): Payer: Self-pay

## 2017-04-01 DIAGNOSIS — M810 Age-related osteoporosis without current pathological fracture: Secondary | ICD-10-CM | POA: Insufficient documentation

## 2017-04-01 MED ORDER — DENOSUMAB 60 MG/ML ~~LOC~~ SOLN
60.0000 mg | Freq: Once | SUBCUTANEOUS | Status: AC
  Administered 2017-04-01: 60 mg via SUBCUTANEOUS
  Filled 2017-04-01: qty 1

## 2017-04-01 NOTE — Discharge Instructions (Signed)
Denosumab injection °What is this medicine? °DENOSUMAB (den oh sue mab) slows bone breakdown. Prolia is used to treat osteoporosis in women after menopause and in men. Xgeva is used to treat a high calcium level due to cancer and to prevent bone fractures and other bone problems caused by multiple myeloma or cancer bone metastases. Xgeva is also used to treat giant cell tumor of the bone. °This medicine may be used for other purposes; ask your health care provider or pharmacist if you have questions. °COMMON BRAND NAME(S): Prolia, XGEVA °What should I tell my health care provider before I take this medicine? °They need to know if you have any of these conditions: °-dental disease °-having surgery or tooth extraction °-infection °-kidney disease °-low levels of calcium or Vitamin D in the blood °-malnutrition °-on hemodialysis °-skin conditions or sensitivity °-thyroid or parathyroid disease °-an unusual reaction to denosumab, other medicines, foods, dyes, or preservatives °-pregnant or trying to get pregnant °-breast-feeding °How should I use this medicine? °This medicine is for injection under the skin. It is given by a health care professional in a hospital or clinic setting. °If you are getting Prolia, a special MedGuide will be given to you by the pharmacist with each prescription and refill. Be sure to read this information carefully each time. °For Prolia, talk to your pediatrician regarding the use of this medicine in children. Special care may be needed. For Xgeva, talk to your pediatrician regarding the use of this medicine in children. While this drug may be prescribed for children as young as 13 years for selected conditions, precautions do apply. °Overdosage: If you think you have taken too much of this medicine contact a poison control center or emergency room at once. °NOTE: This medicine is only for you. Do not share this medicine with others. °What if I miss a dose? °It is important not to miss your  dose. Call your doctor or health care professional if you are unable to keep an appointment. °What may interact with this medicine? °Do not take this medicine with any of the following medications: °-other medicines containing denosumab °This medicine may also interact with the following medications: °-medicines that lower your chance of fighting infection °-steroid medicines like prednisone or cortisone °This list may not describe all possible interactions. Give your health care provider a list of all the medicines, herbs, non-prescription drugs, or dietary supplements you use. Also tell them if you smoke, drink alcohol, or use illegal drugs. Some items may interact with your medicine. °What should I watch for while using this medicine? °Visit your doctor or health care professional for regular checks on your progress. Your doctor or health care professional may order blood tests and other tests to see how you are doing. °Call your doctor or health care professional for advice if you get a fever, chills or sore throat, or other symptoms of a cold or flu. Do not treat yourself. This drug may decrease your body's ability to fight infection. Try to avoid being around people who are sick. °You should make sure you get enough calcium and vitamin D while you are taking this medicine, unless your doctor tells you not to. Discuss the foods you eat and the vitamins you take with your health care professional. °See your dentist regularly. Brush and floss your teeth as directed. Before you have any dental work done, tell your dentist you are receiving this medicine. °Do not become pregnant while taking this medicine or for 5 months after stopping   it. Talk with your doctor or health care professional about your birth control options while taking this medicine. Women should inform their doctor if they wish to become pregnant or think they might be pregnant. There is a potential for serious side effects to an unborn child. Talk  to your health care professional or pharmacist for more information. What side effects may I notice from receiving this medicine? Side effects that you should report to your doctor or health care professional as soon as possible: -allergic reactions like skin rash, itching or hives, swelling of the face, lips, or tongue -bone pain -breathing problems -dizziness -jaw pain, especially after dental work -redness, blistering, peeling of the skin -signs and symptoms of infection like fever or chills; cough; sore throat; pain or trouble passing urine -signs of low calcium like fast heartbeat, muscle cramps or muscle pain; pain, tingling, numbness in the hands or feet; seizures -unusual bleeding or bruising -unusually weak or tired Side effects that usually do not require medical attention (report to your doctor or health care professional if they continue or are bothersome): -constipation -diarrhea -headache -joint pain -loss of appetite -muscle pain -runny nose -tiredness -upset stomach This list may not describe all possible side effects. Call your doctor for medical advice about side effects. You may report side effects to FDA at 1-800-FDA-1088. Where should I keep my medicine? This medicine is only given in a clinic, doctor's office, or other health care setting and will not be stored at home. NOTE: This sheet is a summary. It may not cover all possible information. If you have questions about this medicine, talk to your doctor, pharmacist, or health care provider.  2018 [employer] (2016-02-27 19:17:21)

## 2017-06-27 ENCOUNTER — Encounter: Payer: Self-pay | Admitting: Adult Health

## 2017-06-27 ENCOUNTER — Other Ambulatory Visit (INDEPENDENT_AMBULATORY_CARE_PROVIDER_SITE_OTHER): Payer: Medicare Other

## 2017-06-27 ENCOUNTER — Telehealth: Payer: Self-pay | Admitting: Pulmonary Disease

## 2017-06-27 ENCOUNTER — Ambulatory Visit (INDEPENDENT_AMBULATORY_CARE_PROVIDER_SITE_OTHER)
Admission: RE | Admit: 2017-06-27 | Discharge: 2017-06-27 | Disposition: A | Discharge status: Home / Self Care | Payer: Medicare Other | Source: Ambulatory Visit | Attending: Adult Health | Admitting: Adult Health

## 2017-06-27 ENCOUNTER — Ambulatory Visit
Admission: RE | Admit: 2017-06-27 | Discharge: 2017-06-27 | Disposition: A | Discharge status: Home / Self Care | Payer: Medicare Other | Source: Ambulatory Visit | Attending: Adult Health | Admitting: Adult Health

## 2017-06-27 ENCOUNTER — Ambulatory Visit: Payer: Medicare Other | Admitting: Adult Health

## 2017-06-27 VITALS — BP 110/80 | Ht 66.0 in | Wt 118.6 lb

## 2017-06-27 DIAGNOSIS — R0602 Shortness of breath: Secondary | ICD-10-CM

## 2017-06-27 DIAGNOSIS — A310 Pulmonary mycobacterial infection: Secondary | ICD-10-CM | POA: Diagnosis not present

## 2017-06-27 DIAGNOSIS — R042 Hemoptysis: Secondary | ICD-10-CM

## 2017-06-27 DIAGNOSIS — M79602 Pain in left arm: Secondary | ICD-10-CM | POA: Diagnosis not present

## 2017-06-27 DIAGNOSIS — R000 Tachycardia, unspecified: Secondary | ICD-10-CM | POA: Diagnosis not present

## 2017-06-27 DIAGNOSIS — J471 Bronchiectasis with (acute) exacerbation: Secondary | ICD-10-CM

## 2017-06-27 LAB — BASIC METABOLIC PANEL
BUN: 7 mg/dL (ref 6–23)
CHLORIDE: 108 meq/L (ref 96–112)
CO2: 24 mEq/L (ref 19–32)
Calcium: 9.2 mg/dL (ref 8.4–10.5)
Creatinine, Ser: 0.67 mg/dL (ref 0.40–1.20)
GFR: 90.61 mL/min (ref >60.00)
GLUCOSE: 106 mg/dL — AB (ref 70–99)
POTASSIUM: 4.5 meq/L (ref 3.5–5.1)
Sodium: 139 mEq/L (ref 135–145)

## 2017-06-27 LAB — CBC WITH DIFFERENTIAL/PLATELET
BASOS PCT: 0.7 % (ref 0.0–3.0)
Basophils Absolute: 0.1 10*3/uL (ref 0.0–0.1)
EOS PCT: 2.5 % (ref 0.0–5.0)
Eosinophils Absolute: 0.2 10*3/uL (ref 0.0–0.7)
HCT: 41.8 % (ref 36.0–46.0)
HEMOGLOBIN: 14 g/dL (ref 12.0–15.0)
LYMPHS ABS: 0.7 10*3/uL (ref 0.7–4.0)
Lymphocytes Relative: 8.7 % — ABNORMAL LOW (ref 12.0–46.0)
MCHC: 33.6 g/dL (ref 30.0–36.0)
MCV: 96 fl (ref 78.0–100.0)
MONO ABS: 0.5 10*3/uL (ref 0.1–1.0)
Monocytes Relative: 6.3 % (ref 3.0–12.0)
Neutro Abs: 6.1 10*3/uL (ref 1.4–7.7)
Neutrophils Relative %: 81.8 % — ABNORMAL HIGH (ref 43.0–77.0)
Platelets: 221 10*3/uL (ref 150.0–400.0)
RBC: 4.35 Mil/uL (ref 3.87–5.11)
RDW: 13.3 % (ref 11.5–15.5)
WBC: 7.5 10*3/uL (ref 4.0–10.5)

## 2017-06-27 MED ORDER — IOPAMIDOL (ISOVUE-370) INJECTION 76%
75.0000 mL | Freq: Once | INTRAVENOUS | Status: AC | PRN
  Administered 2017-06-27: 75 mL via INTRAVENOUS

## 2017-06-27 MED ORDER — AMOXICILLIN-POT CLAVULANATE 875-125 MG PO TABS: 1.0000 | ORAL_TABLET | Freq: Two times a day (BID) | ORAL | 0 refills | Status: AC

## 2017-06-27 NOTE — Assessment & Plan Note (Signed)
New onset hemoptysis on 06/26/2017 no recurrence as of today.  Unclear etiology may be due to underlying bronchiectatic exacerbation.  She does have worsening shortness of breath and recent intermittent back pain.  Will check CT chest for progression of bronchiectasis and edema I changes will do PE protocol to rule out underlying PE.

## 2017-06-27 NOTE — Assessment & Plan Note (Signed)
Acute bronchiectasis with exacerbation and associated hemoptysis.  With underlying MAC Check CT chest  tx w/ empiric abx  Sputum cx /afb -however pt says unable to get sample   Plan  Patient Instructions  Augmentin 820m Twice daily for 10 days , take with food.  Mucinex Twice daily  As needed  Cough /congestion  Flutter valve As needed   CT chest today .  Refer to cardiology .  Follow up Dr. SHalford Chessman In 6 weeks and As needed   Follow up with ID clinic as planned later this month .  Please contact office for sooner follow up if symptoms do not improve or worsen or seek emergency care  \

## 2017-06-27 NOTE — Assessment & Plan Note (Signed)
Currently off MAI treatment since March 2019.  Patient to follow-up with ID clinic as planned later this month.

## 2017-06-27 NOTE — Assessment & Plan Note (Signed)
Intermittent left arm pain and numbness with associated shortness of breath for 4 to 6 weeks Possible etiology.  EKG without acute changes. Patient has very nonspecific symptoms.  Is difficult to determine if she has underlying cardiac issues.  Will refer to cardiology for evaluation.  Patient is advised if she develops recurrent symptoms and/or chest pain she is to seek emergency attention immediately. CT chest with PE protocol is pending today.  Labs are pending

## 2017-06-27 NOTE — Addendum Note (Signed)
Addended by: Parke Poisson E on: 06/27/2017 09:09 AM   Modules accepted: Orders

## 2017-06-27 NOTE — Patient Instructions (Signed)
Augmentin 875mg  Twice daily for 10 days , take with food.  Mucinex Twice daily  As needed  Cough /congestion  Flutter valve As needed   CT chest today .  Refer to cardiology .  Follow up Dr. Halford Chessman  In 6 weeks and As needed   Follow up with ID clinic as planned later this month .  Please contact office for sooner follow up if symptoms do not improve or worsen or seek emergency care  \

## 2017-06-27 NOTE — Progress Notes (Signed)
@Patient  ID: [name], female    DOB: [birth year], 77 y.o.   MRN: [id number]  No chief complaint on file.   Referring provider: Leanna Battles, MD  HPI: 76 year old female former smoker followed for bronchiectasis, COPD and MAI Followed at ID clinic for MAI    Pulmonary tests PFT 12/27/04 >> FEV1 2.34 (100%), FEV1% 56, TLC 6.04 (115%), DLCO 87% Bronchoscopy 02/04/07 >> Aspergillus species  PFT 05/21/12 >> FEV1 2.10 (98%), FEV1% 56%, TLC 4.83 (93%), DLCO 77% Sputum 09/15/14 >> MAI  CT CHEST: 08/29/06 >> 6 mm nodule lingula, 2 mm nodule LLL, BTX in lingula and RML,  01/17/07 >> no change 02/16/10 >> BTX RML/lingula/RLL/LLL 06/01/12 >> progression of tree in bud pattern 09/06/14 >> BTX, increased nodularity, 2 cm nodule RLL, 1.3 cm nodule LLL 08/09/15 >> decreased to resolved nodules, no change in BTX  06/27/2017 Acute OV : Hemoptysis  Patient presents for an acute office visit.  She has noticed over the last 4 to 6 weeks and her breathing has not been as good.  She had increased cough and shortness of breath.  Her cough has been minimally productive.  Says she has a lot of mucus but hard to get up.  She has followed by infectious disease clinic for MAI.  She has been treated with triple drug therapy. Stopped MAC meds in March due to recurrent GI issues , has not been able to take since then  Has chronic diveriticulosis , several flares for last 2 months .  She says that yesterday that she felt that she had increased congestion used her flutter valve which she had not used in many weeks.  Shortly after using it noticed that she had bright red blood, when coughing.  This lasted for several episodes for about 15 to 30 minutes.  And then resolved.  She has had no recurrence.  She had no associated fever chest pain, orthopnea, edema, calf pain or syncope or palpitations.  On arrival to the office today O2 saturations 98% on room air.  Chest x-ray today reviewed independently and shows  no acute process with chronic changes  Patient also was seen by her primary care provider yesterday.  For back pain left arm pain and numbness that has been present for 4-6 weeks  .  She says a chest x-ray was done and reported to be unremarkable.  Patient has no chest pain or exertional chest pain.  She denies any jaw pain or syncope.  No associated nausea.  Has noticed that her shortness of breath has been worse over the last 1 to 2 months. She has chronic headaches on topaxax and DJD .  EKG today in the office shows normal sinus rhythm at 87 with nonspecific ST changes.  She denies any current back pain or arm pain..   Allergies  Allergen Reactions  . Barbiturates   . Codeine   . Droperidol   . Meperidine Hcl   . Phenobarbital   . Sulfonamide Derivatives     REACTION: nausea    Immunization History  Administered Date(s) Administered  . Influenza Split 11/19/2011, 11/30/2014, 12/08/2015  . Influenza Whole 11/20/2004, 12/07/2009  . Influenza, High Dose Seasonal PF 12/15/2016  . Influenza,inj,Quad PF,6+ Mos 11/18/2013  . Pneumococcal Conjugate-13 08/29/2014    Past Medical History:  Diagnosis Date  . Allergy    SEASONAL  . Breast cancer, right ([place])   . Bronchiectasis ([place])   . Bronchiectasis ([place]) 11/30/2014  . Cataract  surgery  . Dilation of biliary tract   . Diverticulosis   . Dyspnea    with exertion   . GERD (gastroesophageal reflux disease)   . History of radiation therapy   . 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 Low serum vitamin D 03/06/2015  . Lumbar disc herniation   . Migraine   . Osteopenia   . Osteoporosis    EARLY STAGES  . Ovarian cyst   . Plantar fasciitis   . Pneumonia   . Vitamin B12 deficiency   . Weight loss 11/30/2014    Tobacco History: Social History   Tobacco Use  Smoking Status Never Smoker  Smokeless Tobacco Never Used   Counseling given: Not Answered   Outpatient Encounter Medications as of 06/27/2017  Medication Sig  .  cholecalciferol (VITAMIN D) 1000 units tablet Take 50,000 Units by mouth. Reported on 08/02/2015  . cyanocobalamin 1000 MCG tablet Take 1,000 mcg by mouth daily.  Marland Kitchen denosumab (PROLIA) 60 MG/ML SOLN injection Inject 60 mg into the skin every 6 (six) months. Administer in upper arm, thigh, or abdomen  . fexofenadine (ALLEGRA) 180 MG tablet Take 180 mg by mouth daily as needed. Reported on 04/14/2015  . guaiFENesin (MUCINEX) 600 MG 12 hr tablet Take 600 mg by mouth 2 (two) times daily. Reported on 08/02/2015  . promethazine (PHENERGAN) 25 MG tablet Take 25 mg by mouth every 6 (six) hours as needed for nausea or vomiting.  . propranolol (INDERAL) 10 MG tablet Take 10 mg by mouth daily.  Marland Kitchen Respiratory Therapy Supplies (FLUTTER) DEVI Use as directed  . SUMAtriptan (IMITREX) 100 MG tablet Take 100 mg by mouth as needed. Reported on 04/14/2015  . topiramate (TOPAMAX) 200 MG tablet Take 200 mg by mouth daily. Reported on 04/14/2015  . amoxicillin-clavulanate (AUGMENTIN) 875-125 MG tablet Take 1 tablet by mouth 2 (two) times daily for 10 days.  Marland Kitchen azithromycin (ZITHROMAX) 500 MG tablet Take 1 tablet (500 mg total) by mouth daily. (Patient not taking: Reported on 06/27/2017)  . ethambutol (MYAMBUTOL) 400 MG tablet Take 2 tablets (800 mg total) by mouth daily. (Patient not taking: Reported on 06/27/2017)  . Probiotic Product (PROBIOTIC PO) Take by mouth daily.  . rifampin (RIFADIN) 300 MG capsule Take 2 capsules (600 mg total) by mouth daily. (Patient not taking: Reported on 06/27/2017)  . zolpidem (AMBIEN) 10 MG tablet Take 5 mg by mouth at bedtime as needed. Reported on 08/02/2015   No facility-administered encounter medications on file as of 06/27/2017.      Review of Systems  Constitutional:   No  weight loss, night sweats,  Fevers, chills,  +fatigue, or  lassitude.  HEENT:   No headaches,  Difficulty swallowing,  Tooth/dental problems, or  Sore throat,                No sneezing, itching, ear ache,  +nasal  congestion, post nasal drip,   CV:  No chest pain,  Orthopnea, PND, swelling in lower extremities, anasarca, dizziness, palpitations, syncope.   GI  No heartburn, indigestion, abdominal pain, nausea, vomiting, diarrhea, change in bowel habits, loss of appetite, bloody stools.   Resp:    No chest wall deformity  Skin: no rash or lesions.  GU: no dysuria, change in color of urine, no urgency or frequency.  No flank pain, no hematuria   MS:  No joint pain or swelling.  No decreased range of motion.  No back pain.    Physical Exam  BP 110/80 (BP Location:  Left Arm, Cuff Size: Normal)   Ht 5' 6"  (1.676 m)   Wt 118 lb 9.6 oz (53.8 kg)   SpO2 98%   BMI 19.14 kg/m   GEN: A/Ox3; pleasant , NAD, thin and frail    HEENT:  [place]/AT,  EACs-clear, TMs-wnl, NOSE-clear, THROAT-clear, no lesions, no postnasal drip or exudate noted.   NECK:  Supple w/ fair ROM; no JVD; normal carotid impulses w/o bruits; no thyromegaly or nodules palpated; no lymphadenopathy.    RESP few scattered rhonchi . no accessory muscle use, no dullness to percussion  CARD:  RRR, no m/r/g, no peripheral edema, pulses intact, no cyanosis or clubbing. Neg calf pain , neg homans sign   GI:   Soft & nt; nml bowel sounds; no organomegaly or masses detected.   Musco: Warm bil, no deformities or joint swelling noted.   Neuro: alert, no focal deficits noted.    Skin: Warm, no lesions or rashes    Lab Results:  CBC    Component Value Date/Time   WBC 4.7 05/04/2007 1444   RBC 4.14 05/04/2007 1444   HGB 13.6 05/04/2007 1444   HCT 38.4 05/04/2007 1444   PLT 187 05/04/2007 1444   MCV 92.6 05/04/2007 1444   MCH 32.8 05/04/2007 1444   MCHC 35.4 05/04/2007 1444   RDW 13.4 05/04/2007 1444   LYMPHSABS 0.9 05/04/2007 1444   MONOABS 0.3 05/04/2007 1444   EOSABS 0.3 05/04/2007 1444   BASOSABS 0.0 05/04/2007 1444    BMET    Component Value Date/Time   NA 140 07/22/2016 1131   K 4.9 07/22/2016 1131   CL 108  07/22/2016 1131   CO2 23 07/22/2016 1131   GLUCOSE 97 07/22/2016 1131   BUN 7 07/22/2016 1131   CREATININE 0.72 07/22/2016 1131   CALCIUM 8.8 07/22/2016 1131   GFRNONAA 82 07/22/2016 1131   GFRAA >89 07/22/2016 1131    BNP No results found for: BNP  ProBNP No results found for: PROBNP  Imaging: Dg Chest 2 View  Result Date: 06/27/2017 CLINICAL DATA:  76 year old female with a history of cough for 5 weeks EXAM: CHEST - 2 VIEW COMPARISON:  12/22/2015, CT 08/09/2015 FINDINGS: Cardiomediastinal silhouette unchanged in size and contour. No evidence of central vascular congestion. No interlobular septal thickening or pleural effusion. Stigmata of emphysema, with increased retrosternal airspace, flattened hemidiaphragms, increased AP diameter, and hyperinflation on the AP view. Coarsened interstitial markings persist. No confluent airspace disease or pneumothorax. No acute displaced fracture IMPRESSION: Chronic lung changes and emphysema with no evidence of acute superimposed cardiopulmonary disease. Electronically Signed   By: Corrie Mckusick D.O.   On: 06/27/2017 09:43     Assessment & Plan:   Bronchiectasis ([place]) Acute bronchiectasis with exacerbation and associated hemoptysis.  With underlying MAC Check CT chest  tx w/ empiric abx  Sputum cx /afb -however pt says unable to get sample   Plan  Patient Instructions  Augmentin 86m Twice daily for 10 days , take with food.  Mucinex Twice daily  As needed  Cough /congestion  Flutter valve As needed   CT chest today .  Refer to cardiology .  Follow up Dr. SHalford Chessman In 6 weeks and As needed   Follow up with ID clinic as planned later this month .  Please contact office for sooner follow up if symptoms do not improve or worsen or seek emergency care  \     Left arm pain Intermittent left arm pain and numbness with  associated shortness of breath for 4 to 6 weeks Possible etiology.  EKG without acute changes. Patient has very  nonspecific symptoms.  Is difficult to determine if she has underlying cardiac issues.  Will refer to cardiology for evaluation.  Patient is advised if she develops recurrent symptoms and/or chest pain she is to seek emergency attention immediately. CT chest with PE protocol is pending today.  Labs are pending  MAI (mycobacterium avium-intracellulare) Currently off MAI treatment since March 2019.  Patient to follow-up with ID clinic as planned later this month.  Hemoptysis New onset hemoptysis on 06/26/2017 no recurrence as of today.  Unclear etiology may be due to underlying bronchiectatic exacerbation.  She does have worsening shortness of breath and recent intermittent back pain.  Will check CT chest for progression of bronchiectasis and edema I changes will do PE protocol to rule out underlying PE.     Rexene Edison, NP 06/27/2017

## 2017-06-27 NOTE — Telephone Encounter (Signed)
Called and spoke with pt who stated she laid down yesterday and after that, she sat up to see if there was any any colored sputum and she stated it was blood.  Pt stated she spat up blood for about 10-15 minutes. Pt states the blood was bright red blood she coughed up.  Spoke with TP who stated to add her in one of the held slots and have pt get a stat chest xray.  Stated this information to pt. Pt is heading to office now and will head to get xray first before coming up to our floor.

## 2017-06-30 NOTE — Progress Notes (Signed)
Reviewed and agree with assessment/plan.   Abbeygail Igoe, MD [facility] 02/14/2016, 12:24 PM Pager:  [phone]  

## 2017-07-16 ENCOUNTER — Encounter: Payer: Self-pay | Admitting: Infectious Disease

## 2017-07-16 ENCOUNTER — Ambulatory Visit: Payer: Medicare Other | Admitting: Infectious Disease

## 2017-07-16 VITALS — BP 130/77 | HR 68 | Temp 98.2°F | Ht 66.5 in | Wt 120.0 lb

## 2017-07-16 DIAGNOSIS — R5383 Other fatigue: Secondary | ICD-10-CM

## 2017-07-16 DIAGNOSIS — R110 Nausea: Secondary | ICD-10-CM

## 2017-07-16 DIAGNOSIS — R042 Hemoptysis: Secondary | ICD-10-CM | POA: Diagnosis not present

## 2017-07-16 DIAGNOSIS — A310 Pulmonary mycobacterial infection: Secondary | ICD-10-CM | POA: Diagnosis not present

## 2017-07-16 DIAGNOSIS — J479 Bronchiectasis, uncomplicated: Secondary | ICD-10-CM

## 2017-07-16 DIAGNOSIS — J449 Chronic obstructive pulmonary disease, unspecified: Secondary | ICD-10-CM | POA: Diagnosis not present

## 2017-07-16 DIAGNOSIS — K5732 Diverticulitis of large intestine without perforation or abscess without bleeding: Secondary | ICD-10-CM | POA: Diagnosis not present

## 2017-07-16 HISTORY — DX: Nausea: R11.0

## 2017-07-16 HISTORY — DX: Other fatigue: R53.83

## 2017-07-16 NOTE — Progress Notes (Signed)
Chief complaint: followup for M avium with nodules and bronchiectatic disease now with worsening daily cough   Subjective:   Chief complaint follow-up for Mycobacterium avium infection with chronic cough  Patient ID: [name], female    DOB: 1940-04-22, 77 y.o.   MRN: [id number]  HPI  77 year old with nodular, bronchiectatic M avium infection who had been tolerating 3 drug rx for M avium. In March she had "the flu"--thought is sounds more like a GI virus. She could not tolerate her meds and then decided to see how she did off of them.  IN early May she had an episode of bronchiectasis which was first time she ever had this.   She saw Dr. Halford Chessman with [facility] and received empiric abx.  She is not producing sufficient sputum she believes to produce one that we can test for AFB.  But she is coughing every day.  She is also having increasing fatigue.    Past Medical History:  Diagnosis Date  . Allergy    SEASONAL  . Breast cancer, right ([place])   . Bronchiectasis ([place])   . Bronchiectasis ([place]) 11/30/2014  . Cataract    surgery  . Dilation of biliary tract   . Diverticulosis   . Dyspnea    with exertion   . GERD (gastroesophageal reflux disease)   . History of radiation therapy   . 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 Low serum vitamin D 03/06/2015  . Lumbar disc herniation   . Migraine   . Osteopenia   . Osteoporosis    EARLY STAGES  . Ovarian cyst   . Plantar fasciitis   . Pneumonia   . Vitamin B12 deficiency   . Weight loss 11/30/2014    Past Surgical History:  Procedure Laterality Date  . ABDOMINAL HYSTERECTOMY    . BREAST LUMPECTOMY Right   . CATARACT EXTRACTION Right   . LAPAROSCOPIC CHOLECYSTECTOMY    . MASTECTOMY PARTIAL / LUMPECTOMY    . MYRINGOTOMY     with tympanoplasty and mastoidectomy  . TONSILLECTOMY    . TOTAL VAGINAL HYSTERECTOMY      Family History  Problem Relation Age of Onset  . Bone cancer Father   . Leukemia Mother   . Parkinsonism Mother     . Colon cancer Neg Hx   . Stomach cancer Neg Hx       Social History   Socioeconomic History  . Marital status: Widowed    Spouse name: Not on file  . Number of children: 2  . Years of education: Not on file  . Highest education level: Not on file  Occupational History  . Occupation: Retired  Scientific laboratory technician  . Financial resource strain: Not on file  . Food insecurity:    Worry: Not on file    Inability: Not on file  . Transportation needs:    Medical: Not on file    Non-medical: Not on file  Tobacco Use  . Smoking status: Never Smoker  . Smokeless tobacco: Never Used  Substance and Sexual Activity  . Alcohol use: No    Alcohol/week: 0.0 oz  . Drug use: No  . Sexual activity: Not on file  Lifestyle  . Physical activity:    Days per week: Not on file    Minutes per session: Not on file  . Stress: Not on file  Relationships  . Social connections:    Talks on phone: Not on file    Gets together: Not on  file    Attends religious service: Not on file    Active member of club or organization: Not on file    Attends meetings of clubs or organizations: Not on file    Relationship status: Not on file  Other Topics Concern  . Not on file  Social History Narrative   Daily caffeine     Allergies  Allergen Reactions  . Barbiturates   . Codeine   . Droperidol   . Meperidine Hcl   . Phenobarbital   . Sulfonamide Derivatives     REACTION: nausea     Current Outpatient Medications:  .  azithromycin (ZITHROMAX) 500 MG tablet, Take 1 tablet (500 mg total) by mouth daily. (Patient not taking: Reported on 06/27/2017), Disp: 30 tablet, Rfl: 11 .  cholecalciferol (VITAMIN D) 1000 units tablet, Take 50,000 Units by mouth. Reported on 08/02/2015, Disp: , Rfl:  .  cyanocobalamin 1000 MCG tablet, Take 1,000 mcg by mouth daily., Disp: , Rfl:  .  denosumab (PROLIA) 60 MG/ML SOLN injection, Inject 60 mg into the skin every 6 (six) months. Administer in upper arm, thigh, or abdomen,  Disp: , Rfl:  .  ethambutol (MYAMBUTOL) 400 MG tablet, Take 2 tablets (800 mg total) by mouth daily. (Patient not taking: Reported on 06/27/2017), Disp: 60 tablet, Rfl: 11 .  fexofenadine (ALLEGRA) 180 MG tablet, Take 180 mg by mouth daily as needed. Reported on 04/14/2015, Disp: , Rfl:  .  guaiFENesin (MUCINEX) 600 MG 12 hr tablet, Take 600 mg by mouth 2 (two) times daily. Reported on 08/02/2015, Disp: , Rfl:  .  Probiotic Product (PROBIOTIC PO), Take by mouth daily., Disp: , Rfl:  .  promethazine (PHENERGAN) 25 MG tablet, Take 25 mg by mouth every 6 (six) hours as needed for nausea or vomiting., Disp: , Rfl:  .  propranolol (INDERAL) 10 MG tablet, Take 10 mg by mouth daily., Disp: , Rfl:  .  Respiratory Therapy Supplies (FLUTTER) DEVI, Use as directed, Disp: 1 each, Rfl: 0 .  rifampin (RIFADIN) 300 MG capsule, Take 2 capsules (600 mg total) by mouth daily. (Patient not taking: Reported on 06/27/2017), Disp: 60 capsule, Rfl: 11 .  SUMAtriptan (IMITREX) 100 MG tablet, Take 100 mg by mouth as needed. Reported on 04/14/2015, Disp: , Rfl:  .  topiramate (TOPAMAX) 200 MG tablet, Take 200 mg by mouth daily. Reported on 04/14/2015, Disp: , Rfl:  .  zolpidem (AMBIEN) 10 MG tablet, Take 5 mg by mouth at bedtime as needed. Reported on 08/02/2015, Disp: , Rfl:     Review of Systems  Constitutional: Positive for fatigue. Negative for activity change, appetite change, chills, diaphoresis, fever and unexpected weight change.  HENT: Negative for congestion, rhinorrhea, sinus pressure, sneezing, sore throat and trouble swallowing.   Eyes: Negative for photophobia and visual disturbance.  Respiratory: Positive for cough. Negative for chest tightness, shortness of breath, wheezing and stridor.   Cardiovascular: Negative for chest pain, palpitations and leg swelling.  Gastrointestinal: Negative for abdominal distention, abdominal pain, anal bleeding, blood in stool, constipation, diarrhea, nausea and vomiting.    Genitourinary: Negative for difficulty urinating, dysuria, flank pain and hematuria.  Musculoskeletal: Negative for arthralgias, back pain, gait problem, joint swelling and myalgias.  Skin: Negative for color change, pallor, rash and wound.  Neurological: Negative for dizziness, tremors, weakness and light-headedness.  Hematological: Negative for adenopathy. Does not bruise/bleed easily.  Psychiatric/Behavioral: Negative for agitation, behavioral problems, confusion, decreased concentration, dysphoric mood and sleep disturbance.  Objective:   Physical Exam  Constitutional: She is oriented to person, place, and time. She appears well-developed and well-nourished. No distress.  HENT:  Head: Normocephalic and atraumatic.  Mouth/Throat: No oropharyngeal exudate.  Eyes: Conjunctivae and EOM are normal. No scleral icterus.  Neck: Normal range of motion. Neck supple.  Cardiovascular: Normal rate, regular rhythm and normal heart sounds. Exam reveals no gallop and no friction rub.  No murmur heard. Pulmonary/Chest: Effort normal. No respiratory distress. She has no wheezes.  Prolonged expiratory phase  Abdominal: She exhibits no distension.  Musculoskeletal: She exhibits no edema or tenderness.  Neurological: She is alert and oriented to person, place, and time. She exhibits normal muscle tone. Coordination normal.  Skin: Skin is warm and dry. No rash noted. She is not diaphoretic. No erythema. No pallor.  Psychiatric: She has a normal mood and affect. Her behavior is normal. Judgment and thought content normal.          Assessment & Plan:   Bronchiectatic and nodular M avium infection  I agree with the patient and her believe that she needs to start back on these medications to improve her quad cough and her quality of life.  We have reviewed them with her once again and Onnie Boer reviewed how to take with her once again.  Azithromycin 500mg  (if difficult to tolereate then drop  to 250mg   Ethhambutol 800mg  daily  Rifampin 600mg  daily  I reviewed her CTA personally which does show bronchiectatic changes  We spent greater than 25 minutes with the patient including greater than 50% of time in face to face counsel of the patient re nature of MAI and weighing risks and benefits of treatment vs watchful waiting  and in coordination of her care.

## 2017-07-16 NOTE — Progress Notes (Signed)
HPI: [name] is a 77 y.o. female who is here for her f/u with Dr. Tommy Medal for MAC  Allergies: Allergies  Allergen Reactions  . Barbiturates   . Codeine   . Droperidol   . Meperidine Hcl   . Phenobarbital   . Sulfonamide Derivatives     REACTION: nausea    Vitals: Temp: 98.2 F (36.8 C) (05/29 0923) Temp Source: Oral (05/29 0923) BP: 130/77 (05/29 0923) Pulse Rate: 68 (05/29 0998)  Past Medical History: Past Medical History:  Diagnosis Date  . Allergy    SEASONAL  . Breast cancer, right ([place])   . Bronchiectasis ([place])   . Bronchiectasis ([place]) 11/30/2014  . Cataract    surgery  . Dilation of biliary tract   . Diverticulosis   . Dyspnea    with exertion   . Fatigue 07/16/2017  . GERD (gastroesophageal reflux disease)   . History of radiation therapy   . 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 Low serum vitamin D 03/06/2015  . Lumbar disc herniation   . Migraine   . Nausea 07/16/2017  . Osteopenia   . Osteoporosis    EARLY STAGES  . Ovarian cyst   . Plantar fasciitis   . Pneumonia   . Vitamin B12 deficiency   . Weight loss 11/30/2014    Social History: Social History   Socioeconomic History  . Marital status: Widowed    Spouse name: Not on file  . Number of children: 2  . Years of education: Not on file  . Highest education level: Not on file  Occupational History  . Occupation: Retired  Scientific laboratory technician  . Financial resource strain: Not on file  . Food insecurity:    Worry: Not on file    Inability: Not on file  . Transportation needs:    Medical: Not on file    Non-medical: Not on file  Tobacco Use  . Smoking status: Never Smoker  . Smokeless tobacco: Never Used  Substance and Sexual Activity  . Alcohol use: No    Alcohol/week: 0.0 oz  . Drug use: No  . Sexual activity: Not on file  Lifestyle  . Physical activity:    Days per week: Not on file    Minutes per session: Not on file  . Stress: Not on file  Relationships  . Social connections:   Talks on phone: Not on file    Gets together: Not on file    Attends religious service: Not on file    Active member of club or organization: Not on file    Attends meetings of clubs or organizations: Not on file    Relationship status: Not on file  Other Topics Concern  . Not on file  Social History Narrative   Daily caffeine     Previous Regimen: Azith/rif/ethambutol  Current Regimen: Off  Labs: No results found for: HIV1RNAQUANT, HIV1RNAVL, CD4TABS, HEPBSAB, HEPBSAG, HCVAB  CrCl: Estimated Creatinine Clearance: 50.6 mL/min (by C-G formula based on SCr of 0.67 mg/dL).  Lipids: No results found for: CHOL, TRIG, HDL, CHOLHDL, VLDL, LDLCALC  Assessment: [name] is here today for her MAC visit with Dr. Tommy Medal. She was treated previously for a while before stopping. She was on azith/rif/ethambutol. We counseled her on how to take the meds since some of it requires an empty stomach. She is going to restart them today. Advised her to eat in the morning then wait 2 hours before taking all 3 meds. Then wait another hour  before eating again, if needed. She would like to take all 3 at the same time rather than spacing them out.   Recommendations:  Restart azithromycin/ethambutol/rifampin  Onnie Boer, PharmD, BCPS, AAHIVP, CPP Clinical Infectious Disease Pharmacist [facility] 07/16/2017, 9:50 AM

## 2017-08-05 ENCOUNTER — Other Ambulatory Visit: Payer: Self-pay | Admitting: Infectious Disease

## 2017-08-06 ENCOUNTER — Encounter: Payer: Self-pay | Admitting: Pulmonary Disease

## 2017-08-06 ENCOUNTER — Ambulatory Visit: Payer: Medicare Other | Admitting: Pulmonary Disease

## 2017-08-06 VITALS — BP 112/72 | HR 90 | Ht 66.0 in | Wt 119.0 lb

## 2017-08-06 DIAGNOSIS — J479 Bronchiectasis, uncomplicated: Secondary | ICD-10-CM | POA: Diagnosis not present

## 2017-08-06 DIAGNOSIS — A310 Pulmonary mycobacterial infection: Secondary | ICD-10-CM

## 2017-08-06 NOTE — Patient Instructions (Signed)
Follow up in 6 months 

## 2017-08-06 NOTE — Progress Notes (Signed)
[facility]  Chief Complaint  Patient presents with  . Follow-up    Pt has increase of SOB with exertion in last 2 months, wheezing at times, and dry cough.    Vital signs: BP 112/72 (BP Location: Left Arm, Cuff Size: Normal)   Pulse 90   Ht 5\' 6"  (1.676 m)   Wt 119 lb (54 kg)   SpO2 98%   BMI 19.21 kg/m   History of Present Illness: [name] is a 77 y.o. 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  She saw Tammy last month.  She had worsening cough, fatigue, and then coughed up blood.  CT chest showed improvement, but persistent changes.  She also had flare of diverticulitis.  Saw ID and resumed MAI therapy.  Still feel fatigued.  Has cough and globus sensation.  Not bringing up sputum.  Hasn't coughed blood recently.  No fever, sweats, chest pain, swelling, rash.  Maintaining weight.  Uses mucinex at night.  She was worried flutter valve caused blood.  Physical Exam:  General - pleasant Eyes - pupils reactive ENT - no sinus tenderness, no oral exudate, no LAN Cardiac - regular, no murmur Chest - no wheeze, rales Abd - soft, non tender Ext - no edema Skin - no rashes Neuro - normal strength Psych - normal mood  Assessment/Plan:  Bronchiectasis in setting of MAI. - advised she can try resume flutter valve as needed - continue mucinex at night - has tried inhalers previously, but no benefit - continue MAI therapy per Dr. Tommy Medal  Globus sensation. 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/04/07 >> Aspergillus species  PFT 05/21/12 >> FEV1 2.10 (98%), FEV1% 56%, TLC 4.83 (93%), DLCO 77% Sputum 09/15/14 >> MAI  Imaging: 08/29/06 >> 6 mm nodule  lingula, 2 mm nodule LLL, BTX in lingula and RML,  01/17/07 >> no change 02/16/10 >> BTX RML/lingula/RLL/LLL 06/01/12 >> progression of tree in bud pattern 09/06/14 >> BTX, increased nodularity, 2 cm nodule RLL, 1.3 cm nodule LLL 08/09/15 >> decreased to resolved nodules, no change in BTX CT angio chest 06/27/17 >> BTX most on left, scattered nodularity  Past Medical History: She  has a past medical history of Allergy, Breast cancer, right ([place]), Bronchiectasis ([place]), Bronchiectasis ([place]) (11/30/2014), Cataract, Dilation of biliary tract, Diverticulosis, Dyspnea, Fatigue (07/16/2017), GERD (gastroesophageal reflux disease), History of radiation therapy, Intention tremor (03/06/2015), Low serum vitamin D (03/06/2015), Lumbar disc herniation, Migraine, Nausea (07/16/2017), Osteopenia, Osteoporosis, Ovarian cyst, Plantar fasciitis, Pneumonia, Vitamin B12 deficiency, and Weight loss (11/30/2014).  Past Surgical History: She  has a past surgical history that includes Total vaginal hysterectomy; Myringotomy; Laparoscopic cholecystectomy; Cataract extraction (Right); Breast lumpectomy (Right); Tonsillectomy; Mastectomy partial / lumpectomy; and Abdominal hysterectomy.  Family History: Her family history includes Bone cancer in her father; Leukemia in her mother; Parkinsonism in her mother.  Social History: She  reports that she has never smoked. She has never used smokeless tobacco. She reports that she does not drink alcohol or use drugs.  Medications: Allergies as of 08/06/2017      Reactions   Barbiturates    Codeine    Droperidol    Meperidine Hcl  Phenobarbital    Sulfonamide Derivatives    REACTION: nausea      Medication List        Accurate as of 08/06/17 10:50 AM. Always use your most recent med list.          cholecalciferol 1000 units tablet Commonly known as:  VITAMIN D Take 50,000 Units by mouth. Reported on 08/02/2015   cyanocobalamin 1000 MCG tablet Take 1,000 mcg by  mouth daily.   denosumab 60 MG/ML Soln injection Commonly known as:  PROLIA Inject 60 mg into the skin every 6 (six) months. Administer in upper arm, thigh, or abdomen   ethambutol 400 MG tablet Commonly known as:  MYAMBUTOL TAKE 2 TABLETS (800 MG TOTAL) BY MOUTH DAILY.   fexofenadine 180 MG tablet Commonly known as:  ALLEGRA Take 180 mg by mouth daily as needed. Reported on 04/14/2015   FLUTTER Devi Use as directed   guaiFENesin 600 MG 12 hr tablet Commonly known as:  MUCINEX Take 600 mg by mouth 2 (two) times daily. Reported on 08/02/2015   PROBIOTIC PO Take by mouth daily.   promethazine 25 MG tablet Commonly known as:  PHENERGAN Take 25 mg by mouth every 6 (six) hours as needed for nausea or vomiting.   propranolol 10 MG tablet Commonly known as:  INDERAL Take 10 mg by mouth daily.   rifampin 300 MG capsule Commonly known as:  RIFADIN TAKE 2 CAPSULES (600 MG TOTAL) BY MOUTH DAILY.   SUMAtriptan 100 MG tablet Commonly known as:  IMITREX Take 100 mg by mouth as needed. Reported on 04/14/2015   topiramate 200 MG tablet Commonly known as:  TOPAMAX Take 200 mg by mouth daily. Reported on 04/14/2015   zolpidem 10 MG tablet Commonly known as:  AMBIEN Take 5 mg by mouth at bedtime as needed. Reported on 08/02/2015

## 2017-08-15 ENCOUNTER — Encounter: Payer: Self-pay | Admitting: Internal Medicine

## 2017-08-15 ENCOUNTER — Ambulatory Visit: Payer: Medicare Other | Admitting: Internal Medicine

## 2017-08-15 VITALS — BP 128/78 | HR 85 | Ht 66.0 in | Wt 120.0 lb

## 2017-08-15 DIAGNOSIS — R0602 Shortness of breath: Secondary | ICD-10-CM

## 2017-08-15 NOTE — Progress Notes (Signed)
New Outpatient Visit Date: 08/15/2017  Referring Provider: Chesley Mires, MD [facility]  Chief Complaint: Shortness of breath  HPI:  [name] is a 77 y.o. female who is being seen today for the evaluation of shortness of breath and left arm pain at the request of Dr. Halford Chessman. She has a history of bronchiectasis in the setting of MAI infection followed by Drs. Sood and [facility].  [name] reports at least 20 years of shortness of breath without clear etiology other than the recurrent MAI infections.  She has been on several inhalers in the past without noticeable improvement.  She notes that over the last several months, her breathing seems to have worsened.  She now gets short of breath with slow walking or even talking for extended periods.  [name] denies a history of cardiac disease, though at a recent pulmonology visit, she was noted to have transient tachycardia.  EKG could not be obtained during the episode.  She notes occasional palpitations, particularly when she first gets up in the morning.  She has not had any chest pain though she notes some left hand and arm numbness and accompanying left shoulder pain when lying in bed.  She has stable 3-4 pillow orthopnea but no PND.  She also denies lower extremity edema and lightheadedness.  [name] reports having undergone stress testing approximately 20 years ago and believes that it was normal.  She has been on propranolol for many years, which was initially started for "an irregular heartbeat."  This has not been a problem for some time, though she has been maintained on propranolol over the years for headache prophylaxis as well as more recent treatment of essential tremor.  She notes an episode of hemoptysis last month after using a flutter valve.  Work-up by Dr. Halford Chessman did not reveal a clear etiology; hemoptysis has not  recurred.  --------------------------------------------------------------------------------------------------  Cardiovascular History & Procedures: Cardiovascular Problems:  Shortness of breath  Risk Factors:  Age greater than 65  Cath/PCI:  None  CV Surgery:  None  EP Procedures and Devices:  None  Non-Invasive Evaluation(s):  None available  Recent CV Pertinent Labs: Lab Results  Component Value Date   K 4.5 06/27/2017   BUN 7 06/27/2017   CREATININE 0.67 06/27/2017   CREATININE 0.72 07/22/2016    --------------------------------------------------------------------------------------------------  Past Medical History:  Diagnosis Date  . Allergy    SEASONAL  . Breast cancer, right ([place])   . Bronchiectasis ([place])   . Bronchiectasis ([place]) 11/30/2014  . Cataract    surgery  . Dilation of biliary tract   . Diverticulosis   . Dyspnea    with exertion   . Fatigue 07/16/2017  . GERD (gastroesophageal reflux disease)   . History of radiation therapy   . 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 Low serum vitamin D 03/06/2015  . Lumbar disc herniation   . Migraine   . Nausea 07/16/2017  . Osteopenia   . Osteoporosis    EARLY STAGES  . Ovarian cyst   . Plantar fasciitis   . Pneumonia   . Vitamin B12 deficiency   . Weight loss 11/30/2014    Past Surgical History:  Procedure Laterality Date  . ABDOMINAL HYSTERECTOMY    . BREAST LUMPECTOMY Right   . CATARACT EXTRACTION Right   . LAPAROSCOPIC CHOLECYSTECTOMY    . MASTECTOMY PARTIAL / LUMPECTOMY    . MYRINGOTOMY     with tympanoplasty and mastoidectomy  . TONSILLECTOMY    .  TOTAL VAGINAL HYSTERECTOMY      Current Meds  Medication Sig  . azithromycin (ZITHROMAX) 500 MG tablet Take 500 mg by mouth daily.  . cholecalciferol (VITAMIN D) 1000 units tablet Take 50,000 Units by mouth. Reported on 08/02/2015  . cyanocobalamin 1000 MCG tablet Take 500 mcg by mouth daily.   Marland Kitchen denosumab (PROLIA) 60 MG/ML SOLN injection  Inject 60 mg into the skin every 6 (six) months. Administer in upper arm, thigh, or abdomen  . ethambutol (MYAMBUTOL) 400 MG tablet TAKE 2 TABLETS (800 MG TOTAL) BY MOUTH DAILY.  . fexofenadine (ALLEGRA) 180 MG tablet Take 180 mg by mouth daily as needed for allergies. Reported on 04/14/2015  . guaiFENesin (MUCINEX) 600 MG 12 hr tablet Take 600 mg by mouth 2 (two) times daily. Reported on 08/02/2015  . Probiotic Product (PROBIOTIC PO) Take by mouth as directed.   . promethazine (PHENERGAN) 25 MG tablet Take 25 mg by mouth every 6 (six) hours as needed for nausea or vomiting.  . propranolol (INDERAL) 10 MG tablet Take 10 mg by mouth daily.  Marland Kitchen Respiratory Therapy Supplies (FLUTTER) DEVI Use as directed  . rifampin (RIFADIN) 300 MG capsule TAKE 2 CAPSULES (600 MG TOTAL) BY MOUTH DAILY.  . SUMAtriptan (IMITREX) 100 MG tablet Take 100 mg by mouth as needed. Reported on 04/14/2015  . topiramate (TOPAMAX) 200 MG tablet Take 200 mg by mouth daily. Reported on 04/14/2015    Allergies: Barbiturates; Codeine; Droperidol; Meperidine hcl; Pb-hyoscy-atropine-scopolamine; Phenobarbital; Sulfasalazine; and Sulfonamide derivatives  Social History   Tobacco Use  . Smoking status: Never Smoker  . Smokeless tobacco: Never Used  Substance Use Topics  . Alcohol use: No    Alcohol/week: 0.0 oz  . Drug use: No    Family History  Problem Relation Age of Onset  . Bone cancer Father   . Leukemia Mother   . Parkinsonism Mother   . Colon cancer Neg Hx   . Stomach cancer Neg Hx     Review of Systems: A 12-system review of systems was performed and was negative except as noted in the HPI.  --------------------------------------------------------------------------------------------------  Physical Exam: BP 128/78   Pulse 85   Ht 5\' 6"  (1.676 m)   Wt 120 lb (54.4 kg)   SpO2 94%   BMI 19.37 kg/m   General: Thin woman, seated comfortably on the exam table. HEENT: No conjunctival pallor or scleral icterus.  Moist mucous membranes. OP clear. Neck: Supple without lymphadenopathy, thyromegaly, JVD, or HJR. No carotid bruit. Lungs: Normal work of breathing.  Coarse breath sounds without wheezes or crackles. Heart: Regular rate and rhythm without murmurs, rubs, or gallops. Non-displaced PMI. Abd: Bowel sounds present. Soft, NT/ND without hepatosplenomegaly Ext: No lower extremity edema. Radial, PT, and DP pulses are 2+ bilaterally Skin: Warm and dry without rash. Neuro: CNIII-XII intact. Strength and fine-touch sensation intact in upper and lower extremities bilaterally. Psych: Normal mood and affect.  EKG: Normal sinus rhythm without abnormalities.  Lab Results  Component Value Date   WBC 7.5 06/27/2017   HGB 14.0 06/27/2017   HCT 41.8 06/27/2017   MCV 96.0 06/27/2017   PLT 221.0 06/27/2017    Lab Results  Component Value Date   NA 139 06/27/2017   K 4.5 06/27/2017   CL 108 06/27/2017   CO2 24 06/27/2017   BUN 7 06/27/2017   CREATININE 0.67 06/27/2017   GLUCOSE 106 (H) 06/27/2017   ALT 7 07/22/2016    No results found for: CHOL, HDL, LDLCALC,  LDLDIRECT, TRIG, CHOLHDL   --------------------------------------------------------------------------------------------------  ASSESSMENT AND PLAN: Shortness of breath Chronic problem dating back at least 20 years, though [name] reports worsening over the last few months.  Given recurrent MAI infections and chronic lung changes appreciated on CT, I suspect her dyspnea is due to underlying lung disease.  However, this also places her at risk for development of pulmonary hypertension and right heart failure.  Additionally, she could also have left-sided heart failure and/or coronary disease, given her age.  We have agreed to begin with a transthoracic echocardiogram.  If this does not reveal any obvious abnormalities, we will proceed with myocardial perfusion stress testing.  On the other hand, if there is evidence of pulmonary hypertension,  reduced LVEF, or regional wall motion abnormality, we would move directly to cardiac catheterization instead.  Follow-up: Return to clinic in 6 weeks.  Nelva Bush, MD 08/15/2017 10:09 AM

## 2017-08-15 NOTE — Patient Instructions (Addendum)
Medication Instructions:  Your physician recommends that you continue on your current medications as directed. Please refer to the Current Medication list given to you today.  -- If you need a refill on your cardiac medications before your next appointment, please call your pharmacy. --  Labwork: None ordered  Testing/Procedures: [place] physician has requested that you have an echocardiogram. Echocardiography is a painless test that uses sound waves to create images of your heart. It provides your doctor with information about the size and shape of your heart and how well your heart's chambers and valves are working. This procedure takes approximately one hour. There are no restrictions for this procedure.   Follow-Up: Your physician wants you to follow-up in: 6 WEEKS with APP.     Thank you for choosing [facility]!!    Any Other Special Instructions Will Be Listed Below (If Applicable).

## 2017-08-16 ENCOUNTER — Encounter: Payer: Self-pay | Admitting: Internal Medicine

## 2017-08-16 DIAGNOSIS — R0602 Shortness of breath: Secondary | ICD-10-CM | POA: Insufficient documentation

## 2017-08-18 ENCOUNTER — Ambulatory Visit (HOSPITAL_COMMUNITY): Payer: Medicare Other | Attending: Cardiovascular Disease

## 2017-08-18 ENCOUNTER — Other Ambulatory Visit: Payer: Self-pay

## 2017-08-18 DIAGNOSIS — R0602 Shortness of breath: Secondary | ICD-10-CM | POA: Insufficient documentation

## 2017-08-18 DIAGNOSIS — I253 Aneurysm of heart: Secondary | ICD-10-CM | POA: Insufficient documentation

## 2017-08-18 DIAGNOSIS — M79602 Pain in left arm: Secondary | ICD-10-CM | POA: Insufficient documentation

## 2017-08-18 DIAGNOSIS — J479 Bronchiectasis, uncomplicated: Secondary | ICD-10-CM | POA: Insufficient documentation

## 2017-08-18 DIAGNOSIS — I088 Other rheumatic multiple valve diseases: Secondary | ICD-10-CM | POA: Diagnosis not present

## 2017-08-18 NOTE — Addendum Note (Signed)
Addended by: Joaquim Lai on: 08/18/2017 12:32 PM   Modules accepted: Orders

## 2017-08-20 ENCOUNTER — Other Ambulatory Visit: Payer: Self-pay

## 2017-08-20 DIAGNOSIS — R0602 Shortness of breath: Secondary | ICD-10-CM

## 2017-08-28 ENCOUNTER — Telehealth (HOSPITAL_COMMUNITY): Payer: Self-pay | Admitting: *Deleted

## 2017-08-28 NOTE — Telephone Encounter (Signed)
Patient given detailed instructions per Myocardial Perfusion Study Information Sheet for the test on 09/01/17. Patient notified to arrive 15 minutes early and that it is imperative to arrive on time for appointment to keep from having the test rescheduled.  If you need to cancel or reschedule your appointment, please call the office within 24 hours of your appointment. . Patient verbalized understanding. [name]    

## 2017-09-01 ENCOUNTER — Encounter (HOSPITAL_COMMUNITY): Payer: Medicare Other

## 2017-09-08 ENCOUNTER — Telehealth (HOSPITAL_COMMUNITY): Payer: Self-pay | Admitting: *Deleted

## 2017-09-08 NOTE — Telephone Encounter (Signed)
Patient given detailed instructions per Myocardial Perfusion Study Information Sheet for the test on 09/11/17 at 1000. Patient notified to arrive 15 minutes early and that it is imperative to arrive on time for appointment to keep from having the test rescheduled.  If you need to cancel or reschedule your appointment, please call the office within 24 hours of your appointment. . Patient verbalized understanding.[name], Ranae [place]ms

## 2017-09-11 ENCOUNTER — Telehealth: Payer: Self-pay

## 2017-09-11 ENCOUNTER — Ambulatory Visit (HOSPITAL_COMMUNITY): Payer: Medicare Other | Attending: Internal Medicine

## 2017-09-11 DIAGNOSIS — R0602 Shortness of breath: Secondary | ICD-10-CM

## 2017-09-11 DIAGNOSIS — R002 Palpitations: Secondary | ICD-10-CM | POA: Diagnosis not present

## 2017-09-11 DIAGNOSIS — R0600 Dyspnea, unspecified: Secondary | ICD-10-CM | POA: Insufficient documentation

## 2017-09-11 DIAGNOSIS — R079 Chest pain, unspecified: Secondary | ICD-10-CM | POA: Diagnosis not present

## 2017-09-11 LAB — MYOCARDIAL PERFUSION IMAGING
CHL CUP NUCLEAR SDS: 1
CHL CUP NUCLEAR SRS: 10
CHL CUP NUCLEAR SSS: 11
CHL CUP RESTING HR STRESS: 94 {beats}/min
CSEPPHR: 129 {beats}/min
LV dias vol: 78 mL (ref 46–106)
LV sys vol: 30 mL
RATE: 0.29
TID: 1.06

## 2017-09-11 MED ORDER — TECHNETIUM TC 99M TETROFOSMIN IV KIT
32.2000 | PACK | Freq: Once | INTRAVENOUS | Status: AC | PRN
  Administered 2017-09-11: 32.2 via INTRAVENOUS
  Filled 2017-09-11: qty 33

## 2017-09-11 MED ORDER — AMINOPHYLLINE 25 MG/ML IV SOLN
150.0000 mg | Freq: Once | INTRAVENOUS | Status: AC
  Administered 2017-09-11: 150 mg via INTRAVENOUS

## 2017-09-11 MED ORDER — TECHNETIUM TC 99M TETROFOSMIN IV KIT
10.7000 | PACK | Freq: Once | INTRAVENOUS | Status: AC | PRN
  Administered 2017-09-11: 10.7 via INTRAVENOUS
  Filled 2017-09-11: qty 11

## 2017-09-11 MED ORDER — REGADENOSON 0.4 MG/5ML IV SOLN
0.4000 mg | Freq: Once | INTRAVENOUS | Status: AC
  Administered 2017-09-11: 0.4 mg via INTRAVENOUS

## 2017-09-11 NOTE — Telephone Encounter (Signed)
-----   Message from Nelva Bush, MD sent at 09/11/2017  4:17 PM EDT ----- Please let Ms. Olthoff know that her stress test is normal.  I do not recommend any further testing at this time.  She should f/u with Richardson Dopp, PA, as planned next month to reassess her symptoms.

## 2017-09-11 NOTE — Telephone Encounter (Signed)
Notes recorded by Frederik Schmidt, RN on 09/11/2017 at 4:25 PM EDT Informed patient of results/recommendations. She verbalized understanding. ------

## 2017-09-24 LAB — IFOBT (OCCULT BLOOD): IFOBT: NEGATIVE

## 2017-09-26 ENCOUNTER — Other Ambulatory Visit: Payer: Self-pay | Admitting: Internal Medicine

## 2017-09-26 DIAGNOSIS — R1310 Dysphagia, unspecified: Secondary | ICD-10-CM

## 2017-09-29 ENCOUNTER — Ambulatory Visit
Admission: RE | Admit: 2017-09-29 | Discharge: 2017-09-29 | Disposition: A | Discharge status: Home / Self Care | Payer: Medicare Other | Source: Ambulatory Visit | Attending: Internal Medicine | Admitting: Internal Medicine

## 2017-09-29 DIAGNOSIS — R1310 Dysphagia, unspecified: Secondary | ICD-10-CM

## 2017-09-30 ENCOUNTER — Encounter: Payer: Self-pay | Admitting: Physician Assistant

## 2017-09-30 ENCOUNTER — Ambulatory Visit (HOSPITAL_COMMUNITY): Payer: Medicare Other

## 2017-09-30 ENCOUNTER — Ambulatory Visit: Payer: Medicare Other | Admitting: Physician Assistant

## 2017-09-30 ENCOUNTER — Other Ambulatory Visit: Payer: Self-pay

## 2017-09-30 VITALS — BP 114/80 | HR 84 | Ht 66.0 in | Wt 119.4 lb

## 2017-09-30 DIAGNOSIS — J479 Bronchiectasis, uncomplicated: Secondary | ICD-10-CM

## 2017-09-30 DIAGNOSIS — R5383 Other fatigue: Secondary | ICD-10-CM

## 2017-09-30 DIAGNOSIS — R0602 Shortness of breath: Secondary | ICD-10-CM

## 2017-09-30 LAB — PRO B NATRIURETIC PEPTIDE: NT-PRO BNP: 150 pg/mL (ref 0–738)

## 2017-09-30 NOTE — Progress Notes (Signed)
Cardiology Office Note:    Date:  09/30/2017   ID:  Conley Rolls, DOB 04-28-1940, MRN [id number]  PCP:  Leanna Battles, MD  Cardiologist:  Nelva Bush, MD  Pulmonologist:  Chesley Mires, MD ID:  Alcide Evener, MD  Referring MD: Leanna Battles, MD   Chief Complaint  Patient presents with  . Follow-up    shortness of breath    History of Present Illness:    [name] is a 77 y.o. female with palpitations controlled with beta-blocker and bronchiectasis in the setting of MAI infection.  She was evaluated by Dr. Saunders Revel in June 2019 for worsening shortness of breath.  Echocardiogram was obtained and demonstrated mildly reduced LV function with an EF of 58-09% and mild diastolic dysfunction.  Follow-up nuclear stress test demonstrated normal perfusion with EF 61%.  No further cardiac testing was recommended.    [name] returns for follow-up.  She remains short of breath with activity.  This has been chronic for 20 years, but worse over the last several months.  There has been no significant change since she was last seen.  She denies chest discomfort, syncope, PND or significant lower extremity swelling.  Prior CV studies:   The following studies were reviewed today:  Nuclear stress test 09/11/2017 EF 61, normal perfusion, low risk study  Echo 08/18/2017 EF 45-50, diffuse HK, grade 1 diastolic dysfunction, mild MR, mobile atrial septal aneurysm  Past Medical History:  Diagnosis Date  . Allergy    SEASONAL  . Breast cancer, right ([place])   . Bronchiectasis ([place])   . Bronchiectasis ([place]) 11/30/2014  . Cataract    surgery  . Dilation of biliary tract   . Diverticulosis   . Dyspnea    with exertion   . Fatigue 07/16/2017  . GERD (gastroesophageal reflux disease)   . History of radiation therapy   . 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 Low serum vitamin D 03/06/2015  . Lumbar disc herniation   . Migraine   . Nausea 07/16/2017  . Osteopenia   . Osteoporosis    EARLY  STAGES  . Ovarian cyst   . Plantar fasciitis   . Pneumonia   . Vitamin B12 deficiency   . Weight loss 11/30/2014   Surgical Hx: The patient  has a past surgical history that includes Total vaginal hysterectomy; Myringotomy; Laparoscopic cholecystectomy; Cataract extraction (Right); Breast lumpectomy (Right); Tonsillectomy; Mastectomy partial / lumpectomy; and Abdominal hysterectomy.   Current Medications: Current Meds  Medication Sig  . azithromycin (ZITHROMAX) 500 MG tablet Take 500 mg by mouth daily.  . cholecalciferol (VITAMIN D) 1000 units tablet Take 50,000 Units by mouth. Reported on 08/02/2015  . cyanocobalamin 1000 MCG tablet Take 500 mcg by mouth daily.   Marland Kitchen denosumab (PROLIA) 60 MG/ML SOLN injection Inject 60 mg into the skin every 6 (six) months. Administer in upper arm, thigh, or abdomen  . ethambutol (MYAMBUTOL) 400 MG tablet TAKE 2 TABLETS (800 MG TOTAL) BY MOUTH DAILY.  . fexofenadine (ALLEGRA) 180 MG tablet Take 180 mg by mouth daily as needed for allergies. Reported on 04/14/2015  . guaiFENesin (MUCINEX) 600 MG 12 hr tablet Take 600 mg by mouth 2 (two) times daily. Reported on 08/02/2015  . Probiotic Product (PROBIOTIC PO) Take by mouth as directed.   . promethazine (PHENERGAN) 25 MG tablet Take 25 mg by mouth every 6 (six) hours as needed for nausea or vomiting.  . propranolol (INDERAL) 10 MG tablet Take 10 mg by mouth daily.  Marland Kitchen  Respiratory Therapy Supplies (FLUTTER) DEVI Use as directed  . rifampin (RIFADIN) 300 MG capsule TAKE 2 CAPSULES (600 MG TOTAL) BY MOUTH DAILY.  . SUMAtriptan (IMITREX) 100 MG tablet Take 100 mg by mouth as needed. Reported on 04/14/2015  . topiramate (TOPAMAX) 200 MG tablet Take 200 mg by mouth daily. Reported on 04/14/2015  . Vitamin D, Ergocalciferol, (DRISDOL) 50000 units CAPS capsule Take 50,000 Units by mouth once a week.     Allergies:   Barbiturates; Codeine; Droperidol; Meperidine hcl; Pb-hyoscy-atropine-scopolamine; Phenobarbital;  Sulfasalazine; and Sulfonamide derivatives   Social History   Tobacco Use  . Smoking status: Never Smoker  . Smokeless tobacco: Never Used  Substance Use Topics  . Alcohol use: No    Alcohol/week: 0.0 standard drinks  . Drug use: No     Family Hx: The patient's family history includes Bone cancer in her father; Heart attack in her paternal uncle; Heart disease (age of onset: 88) in her father; Leukemia in her mother; Parkinsonism in her mother. There is no history of Colon cancer or Stomach cancer.  ROS:   Please see the history of present illness.    ROS All other systems reviewed and are negative.   EKGs/Labs/Other Test Reviewed:    Chest CTA 06/27/17 IMPRESSION: 1.  Negative for acute pulmonary embolus. 2. Chronic lung disease with bronchiectasis and MAI. Improved since July 2016 but not fully resolved appearance of distal airway infection compatible with MAI. No pleural effusion or acute findings. 3. Mild for age aortic and coronary artery calcified atherosclerosis. 4. Chronic benign right lobe liver hemangioma.  EKG:  EKG is not ordered today.    Recent Labs: 06/27/2017: BUN 7; Creatinine, Ser 0.67; Hemoglobin 14.0; Platelets 221.0; Potassium 4.5; Sodium 139   Recent Lipid Panel No results found for: CHOL, TRIG, HDL, CHOLHDL, LDLCALC, LDLDIRECT   From [facility] Tool: Cholesterol, total  182.000 m  09/17/2017 HDL    50 MG/DL  09/17/2017 LDL    117.000 m  09/17/2017 Triglycerides   77.000  09/17/2017 Hemoglobin   13.800 g/  09/17/2017 Creatinine, Serum  0.700 mg/  09/17/2017 Potassium   4.500   06/27/2017 ALT (SGPT)   7.000 unit  09/17/2017 TSH    1.630   09/27/2014   Physical Exam:    VS:  BP 114/80   Pulse 84   Ht 5\' 6"  (1.676 m)   Wt 119 lb 6.4 oz (54.2 kg)   SpO2 98%   BMI 19.27 kg/m     Wt Readings from Last 3 Encounters:  09/30/17 119 lb 6.4 oz (54.2 kg)  09/11/17 120 lb (54.4 kg)  08/15/17 120 lb (54.4 kg)     Physical Exam  Constitutional: She is  oriented to person, place, and time. She appears well-developed and well-nourished. No distress.  HENT:  Head: Normocephalic and atraumatic.  Neck: No JVD present.  Cardiovascular: Normal rate and regular rhythm.  No murmur heard. Pulmonary/Chest: Effort normal. She has no wheezes. She has no rales.  Abdominal: Soft.  Musculoskeletal: She exhibits no edema.  Neurological: She is alert and oriented to person, place, and time.  Skin: Skin is warm and dry.    ASSESSMENT & PLAN:    Shortness of breath  Her shortness of breath is probably related to chronic lung disease.  Recent echocardiogram did demonstrate mildly reduced LV function with an EF of 93-26% and mild diastolic dysfunction.  However, her ejection fraction on nuclear stress testing was normal.  At this point, I am  not convinced that she needs initiation of ACE inhibitor or ARB with diuretic therapy.  I have suggested that we obtain a BNP.  If this is normal, I would not pursue any medication changes or further testing.  If this is elevated, I will consider placing her on low-dose diuretic therapy +/- low-dose ARB.  Bronchiectasis without complication [facility]) Continue follow-up with pulmonology as planned.  Other fatigue She notes fatigue along with her shortness of breath over the past several months.  Today, I will obtain a TSH.     Dispo:  Return in about 6 months (around 04/02/2018) for Routine Follow Up, w/ Richardson Dopp, PA-C.   Medication Adjustments/Labs and Tests Ordered: Current medicines are reviewed at length with the patient today.  Concerns regarding medicines are outlined above.  Tests Ordered: Orders Placed This Encounter  Procedures  . Pro b natriuretic peptide  . TSH   Medication Changes: No orders of the defined types were placed in this encounter.   Signed, Richardson Dopp, PA-C  09/30/2017 10:20 AM    [facility] [place], [place], [place]  [address] Phone: [phone]; Fax:  [phone]

## 2017-09-30 NOTE — Patient Instructions (Addendum)
Medication Instructions:  No changes  Labwork: Return in the next 1 week for:  BNP, TSH   Testing/Procedures: None   Follow-Up: Richardson Dopp, PA-C in 6 months.  Any Other Special Instructions Will Be Listed Below (If Applicable). Check your labs from Leanna Battles, MD when you get home.  Call us back to let us know if your TSH (thyroid test) was checked.  If not, I will add a TSH to your lab test above.  If you need a refill on your cardiac medications before your next appointment, please call your pharmacy.

## 2017-10-01 LAB — TSH: TSH: 3.06 u[IU]/mL (ref 0.450–4.500)

## 2017-10-02 ENCOUNTER — Telehealth: Payer: Self-pay | Admitting: *Deleted

## 2017-10-02 NOTE — Telephone Encounter (Signed)
Tried to go over TSH results with pt today, though no answer on her home or cell #. I will try again later.

## 2017-10-02 NOTE — Telephone Encounter (Signed)
Pt has been notified of lab results by phone with verbal understanding. Pt thanked me for the call.   

## 2017-10-02 NOTE — Telephone Encounter (Signed)
-----   Message from Liliane Shi, PA-C sent at 10/01/2017  5:30 PM EDT ----- All values are normal or within acceptable limits.   Medication changes / Follow up labs / Other changes or recommendations:    - Continue current medications and follow up as planned.  Richardson Dopp, PA-C 10/01/2017 5:30 PM

## 2017-10-13 ENCOUNTER — Ambulatory Visit (HOSPITAL_COMMUNITY)
Admission: RE | Admit: 2017-10-13 | Discharge: 2017-10-13 | Disposition: A | Discharge status: Home / Self Care | Payer: Medicare Other | Source: Ambulatory Visit | Attending: Internal Medicine | Admitting: Internal Medicine

## 2017-10-13 ENCOUNTER — Encounter (HOSPITAL_COMMUNITY): Payer: Self-pay

## 2017-10-13 DIAGNOSIS — M810 Age-related osteoporosis without current pathological fracture: Secondary | ICD-10-CM | POA: Insufficient documentation

## 2017-10-13 MED ORDER — DENOSUMAB 60 MG/ML ~~LOC~~ SOSY
60.0000 mg | PREFILLED_SYRINGE | Freq: Once | SUBCUTANEOUS | Status: AC
  Administered 2017-10-13: 60 mg via SUBCUTANEOUS
  Filled 2017-10-13: qty 1

## 2017-10-13 NOTE — Discharge Instructions (Signed)
Denosumab injection / Prolia °What is this medicine? °DENOSUMAB (den oh sue mab) slows bone breakdown. Prolia is used to treat osteoporosis in women after menopause and in men. Xgeva is used to treat a high calcium level due to cancer and to prevent bone fractures and other bone problems caused by multiple myeloma or cancer bone metastases. Xgeva is also used to treat giant cell tumor of the bone. °This medicine may be used for other purposes; ask your health care provider or pharmacist if you have questions. °COMMON BRAND NAME(S): Prolia, XGEVA °What should I tell my health care provider before I take this medicine? °They need to know if you have any of these conditions: °-dental disease °-having surgery or tooth extraction °-infection °-kidney disease °-low levels of calcium or Vitamin D in the blood °-malnutrition °-on hemodialysis °-skin conditions or sensitivity °-thyroid or parathyroid disease °-an unusual reaction to denosumab, other medicines, foods, dyes, or preservatives °-pregnant or trying to get pregnant °-breast-feeding °How should I use this medicine? °This medicine is for injection under the skin. It is given by a health care professional in a hospital or clinic setting. °If you are getting Prolia, a special MedGuide will be given to you by the pharmacist with each prescription and refill. Be sure to read this information carefully each time. °For Prolia, talk to your pediatrician regarding the use of this medicine in children. Special care may be needed. For Xgeva, talk to your pediatrician regarding the use of this medicine in children. While this drug may be prescribed for children as young as 13 years for selected conditions, precautions do apply. °Overdosage: If you think you have taken too much of this medicine contact a poison control center or emergency room at once. °NOTE: This medicine is only for you. Do not share this medicine with others. °What if I miss a dose? °It is important not to  miss your dose. Call your doctor or health care professional if you are unable to keep an appointment. °What may interact with this medicine? °Do not take this medicine with any of the following medications: °-other medicines containing denosumab °This medicine may also interact with the following medications: °-medicines that lower your chance of fighting infection °-steroid medicines like prednisone or cortisone °This list may not describe all possible interactions. Give your health care provider a list of all the medicines, herbs, non-prescription drugs, or dietary supplements you use. Also tell them if you smoke, drink alcohol, or use illegal drugs. Some items may interact with your medicine. °What should I watch for while using this medicine? °Visit your doctor or health care professional for regular checks on your progress. Your doctor or health care professional may order blood tests and other tests to see how you are doing. °Call your doctor or health care professional for advice if you get a fever, chills or sore throat, or other symptoms of a cold or flu. Do not treat yourself. This drug may decrease your body's ability to fight infection. Try to avoid being around people who are sick. °You should make sure you get enough calcium and vitamin D while you are taking this medicine, unless your doctor tells you not to. Discuss the foods you eat and the vitamins you take with your health care professional. °See your dentist regularly. Brush and floss your teeth as directed. Before you have any dental work done, tell your dentist you are receiving this medicine. °Do not become pregnant while taking this medicine or for 5 months   stopping it. Talk with your doctor or health care professional about your birth control options while taking this medicine. Women should inform their doctor if they wish to become pregnant or think they might be pregnant. There is a potential for serious side effects to an unborn  child. Talk to your health care professional or pharmacist for more information. What side effects may I notice from receiving this medicine? Side effects that you should report to your doctor or health care professional as soon as possible: -allergic reactions like skin rash, itching or hives, swelling of the face, lips, or tongue -bone pain -breathing problems -dizziness -jaw pain, especially after dental work -redness, blistering, peeling of the skin -signs and symptoms of infection like fever or chills; cough; sore throat; pain or trouble passing urine -signs of low calcium like fast heartbeat, muscle cramps or muscle pain; pain, tingling, numbness in the hands or feet; seizures -unusual bleeding or bruising -unusually weak or tired Side effects that usually do not require medical attention (report to your doctor or health care professional if they continue or are bothersome): -constipation -diarrhea -headache -joint pain -loss of appetite -muscle pain -runny nose -tiredness -upset stomach This list may not describe all possible side effects. Call your doctor for medical advice about side effects. You may report side effects to FDA at 1-800-FDA-1088. Where should I keep my medicine? This medicine is only given in a clinic, doctor's office, or other health care setting and will not be stored at home. NOTE: This sheet is a summary. It may not cover all possible information. If you have questions about this medicine, talk to your doctor, pharmacist, or health care provider.  2018 Elsevier/Gold Standard (2016-02-27 19:17:21)

## 2017-11-18 ENCOUNTER — Encounter: Payer: Self-pay | Admitting: Infectious Disease

## 2017-11-18 ENCOUNTER — Ambulatory Visit: Payer: Medicare Other | Admitting: Infectious Disease

## 2017-11-18 VITALS — BP 125/77 | HR 81 | Temp 98.3°F | Wt 118.0 lb

## 2017-11-18 DIAGNOSIS — Z23 Encounter for immunization: Secondary | ICD-10-CM

## 2017-11-18 DIAGNOSIS — R5381 Other malaise: Secondary | ICD-10-CM

## 2017-11-18 DIAGNOSIS — R1310 Dysphagia, unspecified: Secondary | ICD-10-CM | POA: Diagnosis not present

## 2017-11-18 DIAGNOSIS — J449 Chronic obstructive pulmonary disease, unspecified: Secondary | ICD-10-CM

## 2017-11-18 DIAGNOSIS — A310 Pulmonary mycobacterial infection: Secondary | ICD-10-CM | POA: Diagnosis not present

## 2017-11-18 HISTORY — DX: Other malaise: R53.81

## 2017-11-18 HISTORY — DX: Dysphagia, unspecified: R13.10

## 2017-11-18 NOTE — Progress Notes (Signed)
Chief complaint: followup for M avium with nodules and bronchiectatic disease still with low energy and fatigue   Subjective:     Patient ID: [name], female    DOB: 1940-11-25, 77 y.o.   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 In March of 2019 she had "the flu"--thought is sounds more like a GI virus. She could not tolerate her meds and then decided to see how she did off of them.  IN early May she had an episode of bronchiectasis which was first time she ever had this.   She saw Dr. Halford Chessman with CCM and received empiric abx.  She was  not producing sufficient sputum she believes to produce one that we can test for AFB.  But she wascoughing every day.  She is also having increasing fatigue.\   We restarted her on empiric rx for M avium and this did result in improvement in her cough.  However she still has problems with fatigue and malaise being worked up by PCP, Pulmonary and Cardiology. She has dysphagia as well and seeing GI.        Past Medical History:  Diagnosis Date  . Allergy    SEASONAL  . Breast cancer, right ([place])   . Bronchiectasis ([place])   . Bronchiectasis ([place]) 11/30/2014  . Cataract    surgery  . Dilation of biliary tract   . Diverticulosis   . Dyspnea    with exertion   . Fatigue 07/16/2017  . GERD (gastroesophageal reflux disease)   . History of radiation therapy   . 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 Low serum vitamin D 03/06/2015  . Lumbar disc herniation   . Migraine   . Nausea 07/16/2017  . Osteopenia   . Osteoporosis    EARLY STAGES  . Ovarian cyst   . Plantar fasciitis   . Pneumonia   . Vitamin B12 deficiency   . Weight loss 11/30/2014    Past Surgical History:  Procedure Laterality Date  . ABDOMINAL HYSTERECTOMY    . BREAST LUMPECTOMY Right   . CATARACT EXTRACTION Right   . LAPAROSCOPIC CHOLECYSTECTOMY    . MASTECTOMY PARTIAL / LUMPECTOMY      . MYRINGOTOMY     with tympanoplasty and mastoidectomy  . TONSILLECTOMY    . TOTAL VAGINAL HYSTERECTOMY      Family History  Problem Relation Age of Onset  . Bone cancer Father   . Heart disease Father 43       CAD-CABG  . Leukemia Mother   . Parkinsonism Mother   . Heart attack Paternal Uncle   . Colon cancer Neg Hx   . Stomach cancer Neg Hx       Social History   Socioeconomic History  . Marital status: Widowed    Spouse name: Not on file  . Number of children: 2  . Years of education: Not on file  . Highest education level: Not on file  Occupational History  . Occupation: Retired  Scientific laboratory technician  . Financial resource strain: Not on file  . Food insecurity:    Worry: Not on file    Inability: Not on file  . Transportation needs:    Medical: Not on file    Non-medical: Not on file  Tobacco Use  . Smoking status: Never Smoker  . Smokeless tobacco: Never Used  Substance and Sexual Activity  . Alcohol  use: No    Alcohol/week: 0.0 standard drinks  . Drug use: No  . Sexual activity: Not on file  Lifestyle  . Physical activity:    Days per week: Not on file    Minutes per session: Not on file  . Stress: Not on file  Relationships  . Social connections:    Talks on phone: Not on file    Gets together: Not on file    Attends religious service: Not on file    Active member of club or organization: Not on file    Attends meetings of clubs or organizations: Not on file    Relationship status: Not on file  Other Topics Concern  . Not on file  Social History Narrative   Daily caffeine     Allergies  Allergen Reactions  . Barbiturates   . Codeine   . Droperidol   . Meperidine Hcl   . Pb-Hyoscy-Atropine-Scopolamine   . Phenobarbital   . Sulfasalazine Other (See Comments)    REACTION: nausea  . Sulfonamide Derivatives     REACTION: nausea     Current Outpatient Medications:  .  azithromycin (ZITHROMAX) 500 MG tablet, Take 500 mg by mouth daily., Disp:  , Rfl:  .  cefdinir (OMNICEF) 300 MG capsule, cefdinir 300 mg capsule  TAKE 1 CAPSULE BY MOUTH TWICE DAILY FOR 7 DAYS, Disp: , Rfl:  .  cholecalciferol (VITAMIN D) 1000 units tablet, Take 50,000 Units by mouth. Reported on 08/02/2015, Disp: , Rfl:  .  cyanocobalamin 1000 MCG tablet, Take 500 mcg by mouth daily. , Disp: , Rfl:  .  denosumab (PROLIA) 60 MG/ML SOLN injection, Inject 60 mg into the skin every 6 (six) months. Administer in upper arm, thigh, or abdomen, Disp: , Rfl:  .  denosumab (PROLIA) 60 MG/ML SOSY injection, Prolia, Disp: , Rfl:  .  ethambutol (MYAMBUTOL) 400 MG tablet, TAKE 2 TABLETS (800 MG TOTAL) BY MOUTH DAILY., Disp: 60 tablet, Rfl: 11 .  fexofenadine (ALLEGRA) 180 MG tablet, Take 180 mg by mouth daily as needed for allergies. Reported on 04/14/2015, Disp: , Rfl:  .  guaiFENesin (MUCINEX) 600 MG 12 hr tablet, Take 600 mg by mouth 2 (two) times daily. Reported on 08/02/2015, Disp: , Rfl:  .  Probiotic Product (PROBIOTIC PO), Take by mouth as directed. , Disp: , Rfl:  .  promethazine (PHENERGAN) 25 MG tablet, Take 25 mg by mouth every 6 (six) hours as needed for nausea or vomiting., Disp: , Rfl:  .  propranolol (INDERAL) 10 MG tablet, Take 10 mg by mouth daily., Disp: , Rfl:  .  Respiratory Therapy Supplies (FLUTTER) DEVI, Use as directed, Disp: 1 each, Rfl: 0 .  rifampin (RIFADIN) 300 MG capsule, TAKE 2 CAPSULES (600 MG TOTAL) BY MOUTH DAILY., Disp: 60 capsule, Rfl: 11 .  SUMAtriptan (IMITREX) 100 MG tablet, Take 100 mg by mouth as needed. Reported on 04/14/2015, Disp: , Rfl:  .  topiramate (TOPAMAX) 200 MG tablet, Take 200 mg by mouth daily. Reported on 04/14/2015, Disp: , Rfl:  .  Vitamin D, Ergocalciferol, (DRISDOL) 50000 units CAPS capsule, Take 50,000 Units by mouth once a week., Disp: , Rfl: 3    Review of Systems  Constitutional: Positive for fatigue. Negative for activity change, appetite change, chills, diaphoresis, fever and unexpected weight change.  HENT: Negative for  congestion, rhinorrhea, sinus pressure, sneezing, sore throat and trouble swallowing.   Eyes: Negative for photophobia and visual disturbance.  Respiratory: Positive for cough. Negative for  chest tightness, shortness of breath, wheezing and stridor.   Cardiovascular: Negative for chest pain, palpitations and leg swelling.  Gastrointestinal: Negative for abdominal distention, abdominal pain, anal bleeding, blood in stool, constipation, diarrhea, nausea and vomiting.  Genitourinary: Negative for difficulty urinating, dysuria, flank pain and hematuria.  Musculoskeletal: Negative for arthralgias, back pain, gait problem, joint swelling and myalgias.  Skin: Negative for color change, pallor, rash and wound.  Neurological: Negative for dizziness, tremors, weakness and light-headedness.  Hematological: Negative for adenopathy. Does not bruise/bleed easily.  Psychiatric/Behavioral: Negative for agitation, behavioral problems, confusion, decreased concentration, dysphoric mood and sleep disturbance.       Objective:   Physical Exam  Constitutional: She is oriented to person, place, and time. She appears well-developed and well-nourished. No distress.  HENT:  Head: Normocephalic and atraumatic.  Mouth/Throat: No oropharyngeal exudate.  Eyes: Conjunctivae and EOM are normal. No scleral icterus.  Neck: Normal range of motion. Neck supple.  Cardiovascular: Normal rate, regular rhythm and normal heart sounds. Exam reveals no gallop and no friction rub.  No murmur heard. Pulmonary/Chest: Effort normal. No respiratory distress. She has no wheezes.  Prolonged expiratory phase  Abdominal: She exhibits no distension.  Musculoskeletal: She exhibits no edema or tenderness.  Neurological: She is alert and oriented to person, place, and time. She exhibits normal muscle tone. Coordination normal.  Skin: Skin is warm and dry. No rash noted. She is not diaphoretic. No erythema. No pallor.  Psychiatric: She has a  normal mood and affect. Her behavior is normal. Judgment and thought content normal.          Assessment & Plan:   Bronchiectatic and nodular M avium infection  Continue :  Azithromycin 500mg  (if difficult to tolereate then drop to 250mg   Ethhambutol 800mg  daily  Rifampin 600mg  daily  RTC in May  Malaise and fatigue: not clear what cause is for this

## 2017-12-11 ENCOUNTER — Encounter: Payer: Self-pay | Admitting: Gastroenterology

## 2017-12-11 ENCOUNTER — Ambulatory Visit: Payer: Medicare Other | Admitting: Gastroenterology

## 2017-12-11 ENCOUNTER — Encounter

## 2017-12-11 VITALS — BP 124/76 | HR 82 | Ht 66.5 in | Wt 119.2 lb

## 2017-12-11 DIAGNOSIS — R1310 Dysphagia, unspecified: Secondary | ICD-10-CM

## 2017-12-11 DIAGNOSIS — K222 Esophageal obstruction: Secondary | ICD-10-CM

## 2017-12-11 DIAGNOSIS — K642 Third degree hemorrhoids: Secondary | ICD-10-CM

## 2017-12-11 DIAGNOSIS — K219 Gastro-esophageal reflux disease without esophagitis: Secondary | ICD-10-CM

## 2017-12-11 DIAGNOSIS — K6289 Other specified diseases of anus and rectum: Secondary | ICD-10-CM | POA: Diagnosis not present

## 2017-12-11 DIAGNOSIS — R1319 Other dysphagia: Secondary | ICD-10-CM

## 2017-12-11 NOTE — Progress Notes (Signed)
[name]    [id number]    [birth year]  Primary Care Physician:Paterson, Quillian Quince, MD  Referring Physician: Leanna Battles, MD [address] [place], [place] [address]  Chief complaint: Dysphagia  HPI: 77 year old female previously followed by Dr. Olevia Perches, last seen in office February 25, 2014 here with complaints of worsening dysphagia. She has recurrent Mycobacterium avium complex infection and is having difficulty swallowing the pills even after she splits them.   Barium esophagram September 29, 2017 showed small sliding hernia with lower esophageal ring, 64m barium tablet did not pass. Also cervical spine degenerative changes with mild anterior spurring pushing on posterior esophagus.  She has trouble swallowing solid food and pills almost daily, has to eat slowly and chew really well.  Denies regurgitation of food impaction.  No melena, change in bowel habits, or blood per rectum.  During her routine gynecologic exam, she was told to have evaluation of prolapsed rectal mucosa.  Hemeoccult negative 09/2017  EGD 05/18/14: erosive gastritis  Colonoscopy 11/06/2011: severe sigmoid diverticulosis, internal and external hemorrhoids      Outpatient Encounter Medications as of 12/11/2017  Medication Sig  . cholecalciferol (VITAMIN D) 1000 units tablet Take 50,000 Units by mouth. Reported on 08/02/2015  . cyanocobalamin 1000 MCG tablet Take 500 mcg by mouth daily.   .Marland Kitchendenosumab (PROLIA) 60 MG/ML SOSY injection Prolia  . ethambutol (MYAMBUTOL) 400 MG tablet TAKE 2 TABLETS (800 MG TOTAL) BY MOUTH DAILY.  . fexofenadine (ALLEGRA) 180 MG tablet Take 180 mg by mouth daily as needed for allergies. Reported on 04/14/2015  . guaiFENesin (MUCINEX) 600 MG 12 hr tablet Take 600 mg by mouth 2 (two) times daily. Reported on 08/02/2015  . Probiotic Product (PROBIOTIC PO) Take by mouth as directed.   . promethazine (PHENERGAN) 25 MG tablet Take 25 mg by mouth every 6 (six) hours as  needed for nausea or vomiting.  . propranolol (INDERAL) 10 MG tablet Take 10 mg by mouth daily.  .Marland KitchenRespiratory Therapy Supplies (FLUTTER) DEVI Use as directed  . rifampin (RIFADIN) 300 MG capsule TAKE 2 CAPSULES (600 MG TOTAL) BY MOUTH DAILY.  . SUMAtriptan (IMITREX) 100 MG tablet Take 100 mg by mouth as needed. Reported on 04/14/2015  . topiramate (TOPAMAX) 200 MG tablet Take 200 mg by mouth daily. Reported on 04/14/2015  . [DISCONTINUED] Vitamin D, Ergocalciferol, (DRISDOL) 50000 units CAPS capsule Take 50,000 Units by mouth once a week.  . [DISCONTINUED] azithromycin (ZITHROMAX) 500 MG tablet Take 500 mg by mouth daily.  . [DISCONTINUED] cefdinir (OMNICEF) 300 MG capsule cefdinir 300 mg capsule  TAKE 1 CAPSULE BY MOUTH TWICE DAILY FOR 7 DAYS  . [DISCONTINUED] denosumab (PROLIA) 60 MG/ML SOLN injection Inject 60 mg into the skin every 6 (six) months. Administer in upper arm, thigh, or abdomen   No facility-administered encounter medications on file as of 12/11/2017.     Allergies as of 12/11/2017 - Review Complete 12/11/2017  Allergen Reaction Noted  . Barbiturates  03/24/2006  . Codeine  03/24/2006  . Droperidol  03/24/2006  . Meperidine hcl  03/24/2006  . Pb-hyoscy-atropine-scopolamine  08/15/2017  . Phenobarbital    . Sulfasalazine Other (See Comments) 03/24/2006  . Sulfonamide derivatives  03/24/2006    Past Medical History:  Diagnosis Date  . Allergy    SEASONAL  . [place]   . [place]   . [place] ([place] 11/[place]/2014  . Cataract    surgery  . Dilation  of biliary tract   . Diverticulosis   . Dysphagia 11/18/2017  . Dyspnea    with exertion   . Fatigue 07/16/2017  . GERD (gastroesophageal reflux disease)   . History of radiation therapy   . 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 Low serum vitamin D 03/06/2015  . Lumbar disc herniation   . Malaise 11/18/2017  . Migraine   . Nausea 07/16/2017  . Osteopenia   . Osteoporosis    EARLY STAGES  .  Ovarian cyst   . Plantar fasciitis   . Pneumonia   . Vitamin B12 deficiency   . Weight loss 11/30/2014    Past Surgical History:  Procedure Laterality Date  . ABDOMINAL HYSTERECTOMY    . BREAST LUMPECTOMY Right   . CATARACT EXTRACTION Right   . LAPAROSCOPIC CHOLECYSTECTOMY    . MASTECTOMY PARTIAL / LUMPECTOMY    . MYRINGOTOMY     with tympanoplasty and mastoidectomy  . TONSILLECTOMY    . TOTAL VAGINAL HYSTERECTOMY      Family History  Problem Relation Age of Onset  . Bone cancer Father   . Heart disease Father 26       CAD-CABG  . Leukemia Mother   . Parkinsonism Mother   . Heart attack Paternal Uncle   . Colon cancer Neg Hx   . Stomach cancer Neg Hx     Social History   Socioeconomic History  . Marital status: Widowed    Spouse name: Not on file  . Number of children: 2  . Years of education: Not on file  . Highest education level: Not on file  Occupational History  . Occupation: Retired  Scientific laboratory technician  . Financial resource strain: Not on file  . Food insecurity:    Worry: Not on file    Inability: Not on file  . Transportation needs:    Medical: Not on file    Non-medical: Not on file  Tobacco Use  . Smoking status: Never Smoker  . Smokeless tobacco: Never Used  Substance and Sexual Activity  . Alcohol use: No    Alcohol/week: 0.0 standard drinks  . Drug use: No  . Sexual activity: Not on file  Lifestyle  . Physical activity:    Days per week: Not on file    Minutes per session: Not on file  . Stress: Not on file  Relationships  . Social connections:    Talks on phone: Not on file    Gets together: Not on file    Attends religious service: Not on file    Active member of club or organization: Not on file    Attends meetings of clubs or organizations: Not on file    Relationship status: Not on file  . Intimate partner violence:    Fear of current or ex partner: Not on file    Emotionally abused: Not on file    Physically abused: Not on file     Forced sexual activity: Not on file  Other Topics Concern  . Not on file  Social History Narrative   Daily caffeine       Review of systems: Review of Systems  Constitutional: Negative for fever and chills.  Positive for fatigue HENT: Negative.   Eyes: Negative for blurred vision.  Respiratory: Positive for cough, shortness of breath and wheezing.   Cardiovascular: Negative for chest pain and palpitations.  Gastrointestinal: as per HPI Genitourinary: Negative for dysuria, urgency, frequency and hematuria.  Musculoskeletal: Negative for myalgias, back pain  and joint pain.  Skin: Negative for itching and rash.  Neurological: Negative for dizziness, tremors, focal weakness, seizures and loss of consciousness.  Endo/Heme/Allergies: Positive for seasonal allergies.  Psychiatric/Behavioral: Negative for depression, suicidal ideas and hallucinations.  All other systems reviewed and are negative.   Physical Exam: Vitals:   12/11/17 1000  BP: 124/76  Pulse: 82   Body mass index is 18.96 kg/m. Gen:      No acute distress HEENT:  EOMI, sclera anicteric Neck:     No masses; no thyromegaly Lungs:    crackles bilaterally; normal respiratory effort CV:         Regular rate and rhythm; no murmurs Abd:      + bowel sounds; soft, non-tender; no palpable masses, no distension Ext:    No edema; adequate peripheral perfusion Skin:      Warm and dry; no rash Neuro: alert and oriented x 3 Psych: normal mood and affect Rectal exam: Decreased anal sphincter tone, prolapsed internal right anterior hemorrhoid and external skin tag, no anal fissure Anoscopy: Grade 3 internal hemorrhoids, no active bleeding, normal dentate line, no visible nodules  Data Reviewed:  Reviewed labs, radiology imaging, old records and pertinent past GI work up  Nuclear stress test July 2019 showed normal EF with no ST segment deviation during stress  Assessment and Plan/Recommendations:  77 year old female with  history of bronchiectasis, Mycobacterium avium infection, chronic GERD with severe distal esophageal stricture here with complaints of worsening solid food dysphagia Scheduled for EGD with esophageal dilation Given she has significant pill dysphagia, will hold off starting PPI as can likely get lodged at EG junction.  Will assess after EGD  Prolapsed internal hemorrhoids: Able to reduce during rectal exam Start Benefiber 1 teaspoon 3 times daily Kegel exercise to improve anal sphincter tone Will consider hemorrhoidal band ligation at next visit if continues to have persistent symptoms   The risks and benefits as well as alternatives of endoscopic procedure(s) have been discussed and reviewed. All questions answered. The patient agrees to proceed.   Damaris Hippo , MD [phone]    CC: Leanna Battles, MD

## 2017-12-11 NOTE — Patient Instructions (Addendum)
You have been scheduled for an endoscopy. Please follow written instructions given to you at your visit today. If you use inhalers (even only as needed), please bring them with you on the day of your procedure. Your physician has requested that you go to www.startemmi.com and enter the access code given to you at your visit today. This web site gives a general overview about your procedure. However, you should still follow specific instructions given to you by our office regarding your preparation for the procedure.  Take benefiber 1 teaspoon three times a day    Kegel Exercises Kegel exercises help strengthen the muscles that support the rectum, vagina, small intestine, bladder, and uterus. Doing Kegel exercises can help:  Improve bladder and bowel control.  Improve sexual response.  Reduce problems and discomfort during pregnancy.  Kegel exercises involve squeezing your pelvic floor muscles, which are the same muscles you squeeze when you try to stop the flow of urine. The exercises can be done while sitting, standing, or lying down, but it is best to vary your position. Phase 1 exercises 1. Squeeze your pelvic floor muscles tight. You should feel a tight lift in your rectal area. If you are a female, you should also feel a tightness in your vaginal area. Keep your stomach, buttocks, and legs relaxed. 2. Hold the muscles tight for up to 10 seconds. 3. Relax your muscles. Repeat this exercise 50 times a day or as many times as told by your health care provider. Continue to do this exercise for at least 4-6 weeks or for as long as told by your health care provider. This information is not intended to replace advice given to you by your health care provider. Make sure you discuss any questions you have with your health care provider. Document Released: 01/22/2012 Document Revised: 09/30/2015 Document Reviewed: 12/25/2014 Elsevier Interactive Patient Education  [employer].

## 2017-12-16 ENCOUNTER — Ambulatory Visit (AMBULATORY_SURGERY_CENTER): Payer: Medicare Other | Admitting: Gastroenterology

## 2017-12-16 ENCOUNTER — Encounter: Payer: Self-pay | Admitting: Gastroenterology

## 2017-12-16 VITALS — BP 146/71 | HR 72 | Temp 98.1°F | Resp 10 | Ht 66.0 in | Wt 119.0 lb

## 2017-12-16 DIAGNOSIS — K2970 Gastritis, unspecified, without bleeding: Secondary | ICD-10-CM | POA: Diagnosis not present

## 2017-12-16 DIAGNOSIS — R1310 Dysphagia, unspecified: Secondary | ICD-10-CM | POA: Diagnosis present

## 2017-12-16 DIAGNOSIS — K222 Esophageal obstruction: Secondary | ICD-10-CM

## 2017-12-16 DIAGNOSIS — K200 Eosinophilic esophagitis: Secondary | ICD-10-CM

## 2017-12-16 MED ORDER — SODIUM CHLORIDE 0.9 % IV SOLN: 500.0000 mL | Freq: Once | INTRAVENOUS | Status: DC

## 2017-12-16 NOTE — Progress Notes (Signed)
Pt's states no medical or surgical changes since previsit or office visit. 

## 2017-12-16 NOTE — Progress Notes (Signed)
Called to room to assist during endoscopic procedure.  Patient ID and intended procedure confirmed with present staff. Received instructions for my participation in the procedure from the performing physician.  

## 2017-12-16 NOTE — Op Note (Signed)
[place] Patient Name: [name] Procedure Date: 12/16/2017 9:49 AM MRN: [id number] Endoscopist: Mauri Pole , MD Age: 77 Referring MD:  Date of Birth: 10/28/1940 Gender: Female Account #: [id number] Procedure:                Upper GI endoscopy Indications:              Dysphagia Medicines:                Monitored Anesthesia Care Procedure:                Pre-Anesthesia Assessment:                           - Prior to the procedure, a History and Physical                            was performed, and patient medications and                            allergies were reviewed. The patient's tolerance of                            previous anesthesia was also reviewed. The risks                            and benefits of the procedure and the sedation                            options and risks were discussed with the patient.                            All questions were answered, and informed consent                            was obtained. Prior Anticoagulants: The patient has                            taken no previous anticoagulant or antiplatelet                            agents. ASA Grade Assessment: III - A patient with                            severe systemic disease. After reviewing the risks                            and benefits, the patient was deemed in                            satisfactory condition to undergo the procedure.                           After obtaining informed consent, the endoscope was  passed under direct vision. Throughout the                            procedure, the patient's blood pressure, pulse, and                            oxygen saturations were monitored continuously. The                            Endoscope was introduced through the mouth, and                            advanced to the second part of duodenum. The upper                            GI endoscopy was accomplished without  difficulty.                            The patient tolerated the procedure well. Scope In: Scope Out: Findings:                 Two benign-appearing, intrinsic severe stenoses                            were found 36 to 40 cm from the incisors. The                            narrowest stenosis measured 1 cm (inner diameter) x                            less than one cm (in length). The stenoses were                            traversed. A TTS dilator was passed through the                            scope. Dilation with an 12-31-11 mm balloon dilator                            was performed to 13 mm. The dilation site was                            examined following endoscope reinsertion and showed                            mild mucosal disruption.                           Mucosal changes including ringed esophagus,                            small-caliber esophagus, crepe paper esophagus and  stenosis were found in the entire esophagus.                            Esophageal findings were graded using the                            Eosinophilic Esophagitis Endoscopic Reference Score                            (EoE-EREFS) as: Edema Grade 1 Present (decreased                            clarity or absence of vascular markings), Rings                            Grade 2 Moderate (distinct rings that do not                            occlude passage of diagnostic 8-10 mm endoscope),                            Exudates Grade 0 None (no white lesions seen),                            Furrows Grade 0 None (no vertical lines seen) and                            Stricture present (10 mm luminal diameter).                            Biopsies were obtained from the proximal and distal                            esophagus with cold forceps for histology of                            suspected eosinophilic esophagitis.                           Patchy moderate inflammation  with hemorrhage                            characterized by adherent blood, congestion (edema)                            and erythema was found in the gastric antrum.                            Biopsies were taken with a cold forceps for                            Helicobacter pylori testing.  The examined duodenum was normal. Complications:            No immediate complications. Estimated Blood Loss:     Estimated blood loss was minimal. Impression:               - Benign-appearing esophageal stenoses. Dilated.                           - Esophageal mucosal changes suspicious for                            eosinophilic esophagitis. Biopsied.                           - Gastritis with hemorrhage. Biopsied.                           - Normal examined duodenum. Recommendation:           - Patient has a contact number available for                            emergencies. The signs and symptoms of potential                            delayed complications were discussed with the                            patient. Return to normal activities tomorrow.                            Written discharge instructions were provided to the                            patient.                           - Resume previous diet. Soft diet X 1 week and then                            slowly advance as tolerated                           - Continue present medications.                           - Use Prilosec (omeprazole) 40 mg PO BID for 3                            months.                           - Follow an antireflux regimen. This includes:                           - Do not lie down for at least 3 to 4 hours after  meals.                           - Raise the head of the bed 4 to 6 inches.                           - Decrease excess weight.                           - Avoid citrus juices and other acidic foods,                            alcohol,  chocolate, mints, coffee and other                            caffeinated beverages, carbonated beverages, fatty                            and fried foods.                           - Avoid tight-fitting clothing.                           - Avoid cigarettes and other tobacco products.                           - Follow up in office visit in 4-6 weeks                           - Repeat EGD in 2-3 months for repeat esophageal                            dilation Mauri Pole, MD 12/16/2017 10:19:32 AM This report has been signed electronically.

## 2017-12-16 NOTE — Patient Instructions (Signed)
Handouts given on dilation diet. Please pay close attention to foods and practices to avoid listed on the last page of your report.   YOU HAD AN ENDOSCOPIC PROCEDURE TODAY AT [facility]:   Refer to the procedure report that was given to you for any specific questions about what was found during the examination.  If the procedure report does not answer your questions, please call your gastroenterologist to clarify.  If you requested that your care partner not be given the details of your procedure findings, then the procedure report has been included in a sealed envelope for you to review at your convenience later.  YOU SHOULD EXPECT: Some feelings of bloating in the abdomen. Passage of more gas than usual.  Walking can help get rid of the air that was put into your GI tract during the procedure and reduce the bloating. If you had a lower endoscopy (such as a colonoscopy or flexible sigmoidoscopy) you may notice spotting of blood in your stool or on the toilet paper. If you underwent a bowel prep for your procedure, you may not have a normal bowel movement for a few days.  Please Note:  You might notice some irritation and congestion in your nose or some drainage.  This is from the oxygen used during your procedure.  There is no need for concern and it should clear up in a day or so.  SYMPTOMS TO REPORT IMMEDIATELY:    Following upper endoscopy (EGD)  Vomiting of blood or coffee ground material  New chest pain or pain under the shoulder blades  Painful or persistently difficult swallowing  New shortness of breath  Fever of 100F or higher  Black, tarry-looking stools  For urgent or emergent issues, a gastroenterologist can be reached at any hour by calling [phone].   DIET: see handout on dilation diet. Drink plenty of fluids but you should avoid alcoholic beverages for 24 hours.  ACTIVITY:  You should plan to take it easy for the rest of today and you should NOT  DRIVE or use heavy machinery until tomorrow (because of the sedation medicines used during the test).    FOLLOW UP: Our staff will call the number listed on your records the next business day following your procedure to check on you and address any questions or concerns that you may have regarding the information given to you following your procedure. If we do not reach you, we will leave a message.  However, if you are feeling well and you are not experiencing any problems, there is no need to return our call.  We will assume that you have returned to your regular daily activities without incident.  If any biopsies were taken you will be contacted by phone or by letter within the next 1-3 weeks.  Please call us at [phone] if you have not heard about the biopsies in 3 weeks.    SIGNATURES/CONFIDENTIALITY: You and/or your care partner have signed paperwork which will be entered into your electronic medical record.  These signatures attest to the fact that that the information above on your After Visit Summary has been reviewed and is understood.  Full responsibility of the confidentiality of this discharge information lies with you and/or your care-partner.

## 2017-12-16 NOTE — Progress Notes (Signed)
Report given to PACU, vss 

## 2017-12-17 ENCOUNTER — Telehealth: Payer: Self-pay | Admitting: *Deleted

## 2017-12-17 NOTE — Telephone Encounter (Signed)
  Follow up Call-  Call back number 12/16/2017  Post procedure Call Back phone  # [phone] or cell [phone]  Permission to leave phone message Yes  Some recent data might be hidden     Patient questions:  Do you have a fever, pain , or abdominal swelling? No. Pain Score  0 *  Have you tolerated food without any problems? Yes.    Have you been able to return to your normal activities? Yes.    Do you have any questions about your discharge instructions: Diet   No. Medications  No. Follow up visit  No.  Do you have questions or concerns about your Care? No.  Actions: * If pain score is 4 or above: No action needed, pain <4.

## 2017-12-26 ENCOUNTER — Other Ambulatory Visit: Payer: Self-pay

## 2017-12-26 MED ORDER — OMEPRAZOLE 40 MG PO CPDR: 40.0000 mg | DELAYED_RELEASE_CAPSULE | Freq: Two times a day (BID) | ORAL | 3 refills | Status: DC

## 2017-12-26 MED ORDER — FLUTICASONE PROPIONATE HFA 220 MCG/ACT IN AERO: INHALATION_SPRAY | RESPIRATORY_TRACT | 2 refills | Status: DC

## 2018-01-01 ENCOUNTER — Ambulatory Visit (AMBULATORY_SURGERY_CENTER): Payer: Self-pay | Admitting: *Deleted

## 2018-01-01 ENCOUNTER — Telehealth: Payer: Self-pay | Admitting: *Deleted

## 2018-01-01 ENCOUNTER — Other Ambulatory Visit: Payer: Self-pay

## 2018-01-01 ENCOUNTER — Telehealth: Payer: Self-pay | Admitting: Gastroenterology

## 2018-01-01 VITALS — Ht 66.0 in | Wt 119.0 lb

## 2018-01-01 DIAGNOSIS — R1310 Dysphagia, unspecified: Secondary | ICD-10-CM

## 2018-01-01 NOTE — Progress Notes (Signed)
No egg or soy allergy known to patient  No issues with past sedation with any surgeries  or procedures, no intubation problems  No diet pills per patient No home 02 use per patient  No blood thinners per patient  Pt denies issues with constipation  No A fib or A flutter  cdif vaccine study through [employer] started 12/24/17,

## 2018-01-01 NOTE — Telephone Encounter (Signed)
Dr Silverio Decamp, I saw this patient in in [place] today. Patient is scheduled for EGD on Tuesday, 01/20/18. 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 You instructed for patient to start meds for this and an office visit in 6-8 weeks. Should she keep this ENDO appt. Thanks, Santiago Glad

## 2018-01-01 NOTE — Telephone Encounter (Signed)
I looked at the last procedure note. It looks like this [facility] EGD will be for dilation.Return appointment in [place] so she is in the "recall system."  It looks like she also questioned the procedure when seen in pre-visit today. Thanks

## 2018-01-02 NOTE — Telephone Encounter (Signed)
Yes she will need follow up EGD with dilation. Thanks

## 2018-01-02 NOTE — Telephone Encounter (Signed)
Discussed with the patient. She will plan on the EGD. Follow up office visit scheduled 02/06/18. Discussed the eosinophilic esophagitis.

## 2018-01-06 ENCOUNTER — Encounter: Payer: Self-pay | Admitting: Gastroenterology

## 2018-01-06 ENCOUNTER — Telehealth: Payer: Self-pay | Admitting: Gastroenterology

## 2018-01-07 ENCOUNTER — Other Ambulatory Visit: Payer: Self-pay

## 2018-01-07 DIAGNOSIS — K642 Third degree hemorrhoids: Secondary | ICD-10-CM

## 2018-01-07 DIAGNOSIS — R5381 Other malaise: Secondary | ICD-10-CM

## 2018-01-07 DIAGNOSIS — R634 Abnormal weight loss: Secondary | ICD-10-CM

## 2018-01-07 NOTE — Telephone Encounter (Signed)
Omeprazole or fluticasone unlikely to be causing fatigue.  Please check ferritin, iron panel, B12 and folate level.  Patient should follow-up with her PMD for further evaluation of fatigue.

## 2018-01-07 NOTE — Telephone Encounter (Signed)
The patient feel the Omeprazole is causing her fatigue. She has actually already stopped Omeprazole 72 hours ago. No improvement in her symptoms. She declines to schedule with her PCP to discuss. She denies fever or other new symptoms. Encouraged to resume Omeprazole as ordered. She does agree to continue Flovent as directed.

## 2018-01-07 NOTE — Telephone Encounter (Signed)
Patient would like to go with this plan. She agrees to come for labs also.

## 2018-01-08 ENCOUNTER — Other Ambulatory Visit (INDEPENDENT_AMBULATORY_CARE_PROVIDER_SITE_OTHER): Payer: Medicare Other

## 2018-01-08 DIAGNOSIS — R5381 Other malaise: Secondary | ICD-10-CM

## 2018-01-08 DIAGNOSIS — K642 Third degree hemorrhoids: Secondary | ICD-10-CM

## 2018-01-08 DIAGNOSIS — R634 Abnormal weight loss: Secondary | ICD-10-CM

## 2018-01-08 LAB — B12 AND FOLATE PANEL
FOLATE: 9.6 ng/mL (ref >5.9)
Vitamin B-12: 1193 pg/mL — ABNORMAL HIGH (ref 211–911)

## 2018-01-09 LAB — IRON,TIBC AND FERRITIN PANEL
Ferritin: 208 ng/mL — ABNORMAL HIGH (ref 15–150)
Iron Saturation: 16 % (ref 15–55)
Iron: 31 ug/dL (ref 27–139)
Total Iron Binding Capacity: 190 ug/dL — ABNORMAL LOW (ref 250–450)
UIBC: 159 ug/dL (ref 118–369)

## 2018-01-20 ENCOUNTER — Encounter: Payer: Medicare Other | Admitting: Gastroenterology

## 2018-02-06 ENCOUNTER — Ambulatory Visit: Payer: Medicare Other | Admitting: Gastroenterology

## 2018-02-06 ENCOUNTER — Encounter: Payer: Self-pay | Admitting: Gastroenterology

## 2018-02-06 VITALS — BP 110/64 | HR 87 | Ht 66.5 in | Wt 122.0 lb

## 2018-02-06 DIAGNOSIS — K200 Eosinophilic esophagitis: Secondary | ICD-10-CM | POA: Diagnosis not present

## 2018-02-06 DIAGNOSIS — R05 Cough: Secondary | ICD-10-CM

## 2018-02-06 DIAGNOSIS — K219 Gastro-esophageal reflux disease without esophagitis: Secondary | ICD-10-CM

## 2018-02-06 DIAGNOSIS — R059 Cough, unspecified: Secondary | ICD-10-CM

## 2018-02-06 DIAGNOSIS — K222 Esophageal obstruction: Secondary | ICD-10-CM

## 2018-02-06 MED ORDER — LANSOPRAZOLE 30 MG PO TBDD: 30.0000 mg | DELAYED_RELEASE_TABLET | Freq: Two times a day (BID) | ORAL | 6 refills | Status: DC

## 2018-02-06 NOTE — Patient Instructions (Signed)
We have sent Prevacid to your pharmacy  We will cancel Endoscopy   If you are age 78 or older, your body mass index should be between 23-30. Your Body mass index is 19.4 kg/m. If this is out of the aforementioned range listed, please consider follow up with your Primary Care Provider.  If you are age 78 or younger, your body mass index should be between 19-25. Your Body mass index is 19.4 kg/m. If this is out of the aformentioned range listed, please consider follow up with your Primary Care Provider.    Thank you for choosing [facility]  Karleen Hampshire Nandigam,MD

## 2018-02-06 NOTE — Progress Notes (Signed)
[name]    [id number]    14-Aug-1940  Primary Care Physician:Paterson, Quillian Quince, MD  Referring Physician: Leanna Battles, MD [address] [place], [place] [address]  Chief complaint: Dysphagia  HPI: 77 year old female with history of esophageal stricture and eosinophilic esophagitis here for follow-up visit.  She was last seen December 11, 2017 in office.   EGD December 16, 2017 esophageal stricture approximately 10 mm in size dilated with TTS balloon to 13 mm.  Endoscopic finding and esophageal biopsies consistent with eosinophilic esophagitis.  Mild gastritis, negative for H. pylori  She stopped taking PPI and Fluticasone, feels they made her sick NOT after she started taking medications she developed fatigue, nausea and sore throat. No fever or vomiting. She felt like reflux and indigesting gotten worse. She also felt phlegm was trying to come up but could not get it out. ?  Saliva felt thicker when she was doing fluticasone inhaler.  Pharmacist told her that she would get hoarseness and she got sore throat immediately after starting PPI and fluticasone.  She is having episodes of unremitting cough with acid taste in the mouth.  Some difficulty swallowing but improved overall compared to prior.  Drinking 3-4 ensure plus a day, is gaining some weight.   Bowel habits are regular, taking Metamucil daily.  Denies any abdominal pain, vomiting, melena or blood per rectum.     Outpatient Encounter Medications as of 02/06/2018  Medication Sig  . azithromycin (ZITHROMAX) 500 MG tablet Take by mouth daily.  . cholecalciferol (VITAMIN D) 1000 units tablet Take 50,000 Units by mouth. Reported on 08/02/2015  . cyanocobalamin 1000 MCG tablet Take 500 mcg by mouth daily.   Marland Kitchen denosumab (PROLIA) 60 MG/ML SOSY injection Prolia  . ethambutol (MYAMBUTOL) 400 MG tablet TAKE 2 TABLETS (800 MG TOTAL) BY MOUTH DAILY.  . fexofenadine (ALLEGRA) 180 MG tablet Take 180 mg by mouth daily  as needed for allergies. Reported on 04/14/2015  . fluticasone (FLOVENT HFA) 220 MCG/ACT inhaler 2 puffs twice daily swallowed not inhaled.  Marland Kitchen guaiFENesin (MUCINEX) 600 MG 12 hr tablet Take 600 mg by mouth 2 (two) times daily. Reported on 08/02/2015  . Probiotic Product (PROBIOTIC PO) Take by mouth as directed.   . promethazine (PHENERGAN) 25 MG tablet Take 25 mg by mouth every 6 (six) hours as needed for nausea or vomiting.  . propranolol (INDERAL) 10 MG tablet Take 10 mg by mouth daily.  Marland Kitchen Respiratory Therapy Supplies (FLUTTER) DEVI Use as directed  . rifampin (RIFADIN) 300 MG capsule TAKE 2 CAPSULES (600 MG TOTAL) BY MOUTH DAILY.  . SUMAtriptan (IMITREX) 100 MG tablet Take 100 mg by mouth as needed. Reported on 04/14/2015  . topiramate (TOPAMAX) 200 MG tablet Take 200 mg by mouth daily. Reported on 04/14/2015  . omeprazole (PRILOSEC) 40 MG capsule Take 1 capsule (40 mg total) by mouth 2 (two) times daily.   No facility-administered encounter medications on file as of 02/06/2018.     Allergies as of 02/06/2018 - Review Complete 02/06/2018  Allergen Reaction Noted  . Barbiturates  03/24/2006  . Codeine  03/24/2006  . Droperidol  03/24/2006  . Meperidine hcl  03/24/2006  . Pb-hyoscy-atropine-scopolamine  08/15/2017  . Phenobarbital    . Sulfasalazine Other (See Comments) 03/24/2006  . Sulfonamide derivatives  03/24/2006    Past Medical History:  Diagnosis Date  . Allergy    SEASONAL  . Breast cancer, right ([place])   . Bronchiectasis (  [place])   . Bronchiectasis ([place]) 11/30/2014  . Cataract    surgery  . Dilation of biliary tract   . Diverticulosis   . Dysphagia 11/18/2017  . Dyspnea    with exertion   . Fatigue 07/16/2017  . GERD (gastroesophageal reflux disease)   . History of radiation therapy   . 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 Low serum vitamin D 03/06/2015  . Lumbar disc herniation   . Malaise 11/18/2017  . Migraine   . Nausea 07/16/2017  . Osteopenia   . Osteoporosis    EARLY  STAGES  . Ovarian cyst   . Plantar fasciitis   . Pneumonia   . Vitamin B12 deficiency   . Weight loss 11/30/2014    Past Surgical History:  Procedure Laterality Date  . ABDOMINAL HYSTERECTOMY    . BREAST LUMPECTOMY Right   . CATARACT EXTRACTION Right   . COLONOSCOPY    . LAPAROSCOPIC CHOLECYSTECTOMY    . MASTECTOMY PARTIAL / LUMPECTOMY    . MASTOIDECTOMY    . MYRINGOTOMY     with tympanoplasty and mastoidectomy  . NOSE SURGERY    . TONSILLECTOMY    . TOTAL VAGINAL HYSTERECTOMY    . UPPER GASTROINTESTINAL ENDOSCOPY      Family History  Problem Relation Age of Onset  . Bone cancer Father   . Heart disease Father 61       CAD-CABG  . Leukemia Mother   . Parkinsonism Mother   . Heart attack Paternal Uncle   . Colon cancer Neg Hx   . Stomach cancer Neg Hx   . Colon polyps Neg Hx   . Esophageal cancer Neg Hx   . Rectal cancer Neg Hx     Social History   Socioeconomic History  . Marital status: Widowed    Spouse name: Not on file  . Number of children: 2  . Years of education: Not on file  . Highest education level: Not on file  Occupational History  . Occupation: Retired  Scientific laboratory technician  . Financial resource strain: Not on file  . Food insecurity:    Worry: Not on file    Inability: Not on file  . Transportation needs:    Medical: Not on file    Non-medical: Not on file  Tobacco Use  . Smoking status: Never Smoker  . Smokeless tobacco: Never Used  Substance and Sexual Activity  . Alcohol use: No    Alcohol/week: 0.0 standard drinks  . Drug use: No  . Sexual activity: Not Currently  Lifestyle  . Physical activity:    Days per week: Not on file    Minutes per session: Not on file  . Stress: Not on file  Relationships  . Social connections:    Talks on phone: Not on file    Gets together: Not on file    Attends religious service: Not on file    Active member of club or organization: Not on file    Attends meetings of clubs or organizations: Not on  file    Relationship status: Not on file  . Intimate partner violence:    Fear of current or ex partner: Not on file    Emotionally abused: Not on file    Physically abused: Not on file    Forced sexual activity: Not on file  Other Topics Concern  . Not on file  Social History Narrative   Daily caffeine       Review of systems: Review of  Systems  Constitutional: Negative for fever and chills.  HENT: Positive for difficulty hearing Eyes: Negative for blurred vision.  Respiratory: Positive for cough, shortness of breath and wheezing.   Cardiovascular: Negative for chest pain and palpitations.  Gastrointestinal: as per HPI Genitourinary: Negative for dysuria, urgency, frequency and hematuria.  Musculoskeletal: Positive for myalgias, back pain and joint pain.  Skin: Negative for itching and rash.  Neurological: Negative for dizziness, tremors, focal weakness, seizures and loss of consciousness.  Positive for headaches Endo/Heme/Allergies: Positive for seasonal allergies.  Psychiatric/Behavioral: Negative for depression, suicidal ideas and hallucinations.  All other systems reviewed and are negative.   Physical Exam: Vitals:   02/06/18 0907  BP: 110/64  Pulse: 87   Body mass index is 19.4 kg/m. 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 Will reschedule EGD with esophageal dilation given she has not been taking medications Return in 1 to 2 months or sooner if needed  25 minutes was spent face-to-face with the patient. Greater than 50% of the time used for counseling as well as treatment plan and follow-up. She had multiple questions which were answered to her satisfaction  K. Denzil Magnuson , MD [phone]    CC: Leanna Battles, MD

## 2018-02-13 ENCOUNTER — Encounter: Payer: Medicare Other | Admitting: Gastroenterology

## 2018-03-20 ENCOUNTER — Ambulatory Visit: Payer: Medicare Other | Admitting: Gastroenterology

## 2018-03-20 ENCOUNTER — Encounter: Payer: Self-pay | Admitting: Gastroenterology

## 2018-03-20 ENCOUNTER — Other Ambulatory Visit: Payer: Self-pay | Admitting: Gastroenterology

## 2018-03-20 VITALS — BP 120/70 | HR 76 | Ht 65.25 in | Wt 126.0 lb

## 2018-03-20 DIAGNOSIS — K222 Esophageal obstruction: Secondary | ICD-10-CM

## 2018-03-20 DIAGNOSIS — K2100 Gastro-esophageal reflux disease with esophagitis, without bleeding: Secondary | ICD-10-CM

## 2018-03-20 DIAGNOSIS — R1310 Dysphagia, unspecified: Secondary | ICD-10-CM

## 2018-03-20 DIAGNOSIS — K200 Eosinophilic esophagitis: Secondary | ICD-10-CM

## 2018-03-20 NOTE — Patient Instructions (Signed)
You have been scheduled for an endoscopy. Please follow written instructions given to you at your visit today. If you use inhalers (even only as needed), please bring them with you on the day of your procedure.   If you are age 79 or older, your body mass index should be between 23-30. Your Body mass index is 20.81 kg/m. If this is out of the aforementioned range listed, please consider follow up with your Primary Care Provider.  If you are age 37 or younger, your body mass index should be between 19-25. Your Body mass index is 20.81 kg/m. If this is out of the aformentioned range listed, please consider follow up with your Primary Care Provider.    Continue Prevacid  Follow up in 3 months  I appreciate the  opportunity to care for you  Thank You   Harl Bowie , MD

## 2018-03-20 NOTE — Telephone Encounter (Signed)
Dr Silverio Decamp is it ok to refill flovent?

## 2018-03-20 NOTE — Telephone Encounter (Signed)
Yes okay to send a refill.

## 2018-03-20 NOTE — Progress Notes (Signed)
[name]    [id number]    April 11, 1940  Primary Care Physician:Paterson, Quillian Quince, MD  Referring Physician: Leanna Battles, MD [address] [place], [place] [address]  Chief complaint:  EOE , GERD  HPI: 78 year old female with history of esophageal stricture and eosinophilic esophagitis. She is taking lansoprazole twice daily and feels some improvement, does not have to clear her throat as frequently and she is tolerating the lansoprazole well.  She is not taking fluticasone. EGD December 16, 2017 esophageal stricture approximately 10 mm in size dilated with TTS balloon to 13 mm.  Endoscopic finding and esophageal biopsies consistent with eosinophilic esophagitis.  Mild gastritis, negative for H. pylori She continues to have trouble swallowing especially large pills and meat.  She mostly avoids meat other than occasional ground meat. She continues to have headache, drinks coffee twice daily which seems to be helping. Denies any nausea, vomiting, abdominal pain, melena or bright red blood per rectum     Outpatient Encounter Medications as of 03/20/2018  Medication Sig  . azithromycin (ZITHROMAX) 500 MG tablet Take by mouth daily.  . cholecalciferol (VITAMIN D) 1000 units tablet Take 50,000 Units by mouth. Reported on 08/02/2015  . cyanocobalamin 1000 MCG tablet Take 500 mcg by mouth daily.   Marland Kitchen denosumab (PROLIA) 60 MG/ML SOSY injection Prolia  . ethambutol (MYAMBUTOL) 400 MG tablet TAKE 2 TABLETS (800 MG TOTAL) BY MOUTH DAILY.  . fexofenadine (ALLEGRA) 180 MG tablet Take 180 mg by mouth daily as needed for allergies. Reported on 04/14/2015  . guaiFENesin (MUCINEX) 600 MG 12 hr tablet Take 600 mg by mouth 2 (two) times daily. Reported on 08/02/2015  . lansoprazole (PREVACID SOLUTAB) 30 MG disintegrating tablet Take 1 tablet (30 mg total) by mouth 2 (two) times daily before a meal.  . Probiotic Product (PROBIOTIC PO) Take by mouth as directed.   . promethazine  (PHENERGAN) 25 MG tablet Take 25 mg by mouth every 6 (six) hours as needed for nausea or vomiting.  . propranolol (INDERAL) 10 MG tablet Take 10 mg by mouth daily.  Marland Kitchen Respiratory Therapy Supplies (FLUTTER) DEVI Use as directed  . rifampin (RIFADIN) 300 MG capsule TAKE 2 CAPSULES (600 MG TOTAL) BY MOUTH DAILY.  . SUMAtriptan (IMITREX) 100 MG tablet Take 100 mg by mouth as needed. Reported on 04/14/2015  . topiramate (TOPAMAX) 200 MG tablet Take 200 mg by mouth daily. Reported on 04/14/2015  . [DISCONTINUED] fluticasone (FLOVENT HFA) 220 MCG/ACT inhaler 2 puffs twice daily swallowed not inhaled.  . [DISCONTINUED] omeprazole (PRILOSEC) 40 MG capsule Take 1 capsule (40 mg total) by mouth 2 (two) times daily.   No facility-administered encounter medications on file as of 03/20/2018.     Allergies as of 03/20/2018 - Review Complete 03/20/2018  Allergen Reaction Noted  . Barbiturates  03/24/2006  . Codeine  03/24/2006  . Droperidol  03/24/2006  . Meperidine hcl  03/24/2006  . Pb-hyoscy-atropine-scopolamine  08/15/2017  . Phenobarbital    . Sulfasalazine Other (See Comments) 03/24/2006  . Sulfonamide derivatives  03/24/2006    Past Medical History:  Diagnosis Date  . Allergy    SEASONAL  . Breast cancer, right ([facility])   . Bronchiectasis ([place])   . Bronchiectasis ([place]) 11/30/2014  . Cataract    surgery  . Dilation of biliary tract   . Diverticulosis   . Dysphagia 11/18/2017  . Dyspnea    with exertion   . Fatigue 07/16/2017  . GERD (  gastroesophageal reflux disease)   . History of radiation therapy   . 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 Low serum vitamin D 03/06/2015  . Lumbar disc herniation   . Malaise 11/18/2017  . Migraine   . Nausea 07/16/2017  . Osteopenia   . Osteoporosis    EARLY STAGES  . Ovarian cyst   . Plantar fasciitis   . Pneumonia   . Vitamin B12 deficiency   . Weight loss 11/30/2014    Past Surgical History:  Procedure Laterality Date  . ABDOMINAL HYSTERECTOMY    .  BREAST LUMPECTOMY Right   . CATARACT EXTRACTION Right   . COLONOSCOPY    . LAPAROSCOPIC CHOLECYSTECTOMY    . MASTECTOMY PARTIAL / LUMPECTOMY    . MASTOIDECTOMY    . MYRINGOTOMY     with tympanoplasty and mastoidectomy  . NOSE SURGERY    . TONSILLECTOMY    . TOTAL VAGINAL HYSTERECTOMY    . UPPER GASTROINTESTINAL ENDOSCOPY      Family History  Problem Relation Age of Onset  . Bone cancer Father   . Heart disease Father 88       CAD-CABG  . Leukemia Mother   . Parkinsonism Mother   . Heart attack Paternal Uncle   . Colon cancer Neg Hx   . Stomach cancer Neg Hx   . Colon polyps Neg Hx   . Esophageal cancer Neg Hx   . Rectal cancer Neg Hx     Social History   Socioeconomic History  . Marital status: Widowed    Spouse name: Not on file  . Number of children: 2  . Years of education: Not on file  . Highest education level: Not on file  Occupational History  . Occupation: Retired  Scientific laboratory technician  . Financial resource strain: Not on file  . Food insecurity:    Worry: Not on file    Inability: Not on file  . Transportation needs:    Medical: Not on file    Non-medical: Not on file  Tobacco Use  . Smoking status: Never Smoker  . Smokeless tobacco: Never Used  Substance and Sexual Activity  . Alcohol use: No    Alcohol/week: 0.0 standard drinks  . Drug use: No  . Sexual activity: Not Currently  Lifestyle  . Physical activity:    Days per week: Not on file    Minutes per session: Not on file  . Stress: Not on file  Relationships  . Social connections:    Talks on phone: Not on file    Gets together: Not on file    Attends religious service: Not on file    Active member of club or organization: Not on file    Attends meetings of clubs or organizations: Not on file    Relationship status: Not on file  . Intimate partner violence:    Fear of current or ex partner: Not on file    Emotionally abused: Not on file    Physically abused: Not on file    Forced sexual  activity: Not on file  Other Topics Concern  . Not on file  Social History Narrative   Daily caffeine       Review of systems: Review of Systems  Constitutional: Negative for fever and chills.  Positive for lack of energy HENT: Positive for difficulty hearing Eyes: Negative for blurred vision.  Respiratory: Negative for cough, shortness of breath and wheezing.   Cardiovascular: Negative for chest pain and palpitations.  Gastrointestinal:  as per HPI Genitourinary: Negative for dysuria, urgency, frequency and hematuria.  Musculoskeletal: Positive for myalgias, back pain and joint pain.  Skin: Negative for itching and rash.  Neurological: Negative for dizziness, tremors, focal weakness, seizures and loss of consciousness.  Endo/Heme/Allergies: Positive for seasonal allergies.  Psychiatric/Behavioral: Negative for depression, suicidal ideas and hallucinations.  Positive for frequent headaches All other systems reviewed and are negative.   Physical Exam: Vitals:   03/20/18 0910  BP: 120/70  Pulse: 76   Body mass index is 20.81 kg/m. Gen:      No acute distress HEENT:  EOMI, sclera anicteric Neck:     No masses; no thyromegaly Lungs:    Clear to auscultation bilaterally; normal respiratory effort CV:         Regular rate and rhythm; no murmurs Abd:      + bowel sounds; soft, non-tender; no palpable masses, no distension Ext:    No edema; adequate peripheral perfusion Skin:      Warm and dry; no rash Neuro: alert and oriented x 3 Psych: normal mood and affect  Data Reviewed:  Reviewed labs, radiology imaging, old records and pertinent past GI work up   Assessment and Plan/Recommendations:  78 year old female with history of Mycobacterium avium complex, chronic cough, GERD, esophageal stricture and esophageal biopsies consistent with eosinophilic esophagitis Continue Prevacid 30 mg twice daily Discussed antireflux measures and lifestyle modifications in  detail Persistent solid food and pill dysphagia secondary to esophageal stricture We will plan for EGD with esophageal dilation  The risks and benefits as well as alternatives of endoscopic procedure(s) have been discussed and reviewed. All questions answered. The patient agrees to proceed.   Damaris Hippo , MD [phone]    CC: Leanna Battles, MD

## 2018-03-25 ENCOUNTER — Encounter: Payer: Self-pay | Admitting: Gastroenterology

## 2018-03-25 ENCOUNTER — Ambulatory Visit (AMBULATORY_SURGERY_CENTER): Payer: Medicare Other | Admitting: Gastroenterology

## 2018-03-25 VITALS — BP 109/58 | HR 70 | Temp 98.6°F | Resp 13 | Ht 65.25 in | Wt 126.0 lb

## 2018-03-25 DIAGNOSIS — K2960 Other gastritis without bleeding: Secondary | ICD-10-CM | POA: Diagnosis not present

## 2018-03-25 DIAGNOSIS — K222 Esophageal obstruction: Secondary | ICD-10-CM

## 2018-03-25 DIAGNOSIS — R1310 Dysphagia, unspecified: Secondary | ICD-10-CM

## 2018-03-25 MED ORDER — SODIUM CHLORIDE 0.9 % IV SOLN: 500.0000 mL | INTRAVENOUS | Status: AC

## 2018-03-25 NOTE — Progress Notes (Signed)
Called to room to assist during endoscopic procedure.  Patient ID and intended procedure confirmed with present staff. Received instructions for my participation in the procedure from the performing physician.  

## 2018-03-25 NOTE — Patient Instructions (Signed)
YOU HAD AN ENDOSCOPIC PROCEDURE TODAY AT [facility]:   Refer to the procedure report that was given to you for any specific questions about what was found during the examination.  If the procedure report does not answer your questions, please call your gastroenterologist to clarify.  If you requested that your care partner not be given the details of your procedure findings, then the procedure report has been included in a sealed envelope for you to review at your convenience later.  YOU SHOULD EXPECT: Some feelings of bloating in the abdomen. Passage of more gas than usual.  Walking can help get rid of the air that was put into your GI tract during the procedure and reduce the bloating. If you had a lower endoscopy (such as a colonoscopy or flexible sigmoidoscopy) you may notice spotting of blood in your stool or on the toilet paper. If you underwent a bowel prep for your procedure, you may not have a normal bowel movement for a few days.  Please Note:  You might notice some irritation and congestion in your nose or some drainage.  This is from the oxygen used during your procedure.  There is no need for concern and it should clear up in a day or so.  SYMPTOMS TO REPORT IMMEDIATELY:   Following upper endoscopy (EGD)  Vomiting of blood or coffee ground material  New chest pain or pain under the shoulder blades  Painful or persistently difficult swallowing  New shortness of breath  Fever of 100F or higher  Black, tarry-looking stools  For urgent or emergent issues, a gastroenterologist can be reached at any hour by calling [phone].   DIET:  Follow post dilation diet.  Drink plenty of fluids but you should avoid alcoholic beverages for 24 hours.  ACTIVITY:  You should plan to take it easy for the rest of today and you should NOT DRIVE or use heavy machinery until tomorrow (because of the sedation medicines used during the test).    FOLLOW UP: Our staff will call the  number listed on your records the next business day following your procedure to check on you and address any questions or concerns that you may have regarding the information given to you following your procedure. If we do not reach you, we will leave a message.  However, if you are feeling well and you are not experiencing any problems, there is no need to return our call.  We will assume that you have returned to your regular daily activities without incident.  Continue present medications, resume previous diet Continue Prevacid 30 mg twice daily, Follow antireflux regime. Office will call you for a 6-8 weeks follow up office visit .Marland KitchenNo ibuprofen, naproxen or other non-steroidal anti-inflammatory drugs .   If any biopsies were taken you will be contacted by phone or by letter within the next 1-3 weeks.  Please call us at [phone] if you have not heard about the biopsies in 3 weeks.    SIGNATURES/CONFIDENTIALITY: You and/or your care partner have signed paperwork which will be entered into your electronic medical record.  These signatures attest to the fact that that the information above on your After Visit Summary has been reviewed and is understood.  Full responsibility of the confidentiality of this discharge information lies with you and/or your care-partner.

## 2018-03-25 NOTE — Progress Notes (Signed)
Pt awake. VSS. Report given to RN. No anesthetic complications noted 

## 2018-03-25 NOTE — Op Note (Signed)
[facility] Patient Name: [name] Procedure Date: 03/25/2018 9:22 AM MRN: [id number] Endoscopist: Mauri Pole , MD Age: 78 Referring MD:  Date of Birth: 1940-08-28 Gender: Female Account #: [id number] Procedure:                Upper GI endoscopy Indications:              Dysphagia Medicines:                Monitored Anesthesia Care Procedure:                Pre-Anesthesia Assessment:                           - Prior to the procedure, a History and Physical                            was performed, and patient medications and                            allergies were reviewed. The patient's tolerance of                            previous anesthesia was also reviewed. The risks                            and benefits of the procedure and the sedation                            options and risks were discussed with the patient.                            All questions were answered, and informed consent                            was obtained. Prior Anticoagulants: The patient has                            taken no previous anticoagulant or antiplatelet                            agents. ASA Grade Assessment: II - A patient with                            mild systemic disease. After reviewing the risks                            and benefits, the patient was deemed in                            satisfactory condition to undergo the procedure.                           After obtaining informed consent, the endoscope was  passed under direct vision. Throughout the                            procedure, the patient's blood pressure, pulse, and                            oxygen saturations were monitored continuously. The                            Endoscope was introduced through the mouth, and                            advanced to the second part of duodenum. The upper                            GI endoscopy was accomplished without  difficulty.                            The patient tolerated the procedure well. Scope In: Scope Out: Findings:                 LA Grade C (one or more mucosal breaks continuous                            between tops of 2 or more mucosal folds, less than                            75% circumference) esophagitis was found 30 to 38                            cm from the incisors.                           One benign-appearing, intrinsic moderate                            (circumferential scarring or stenosis; an endoscope                            may pass) stenosis was found 37 to 38 cm from the                            incisors. This stenosis measured 1.2 cm (inner                            diameter) x less than one cm (in length). The                            stenosis was traversed. A TTS dilator was passed                            through the scope. Dilation with a 13.5-14.5-15.5  mm balloon dilator was performed to 15.5 mm.                           Diffuse severe inflammation with hemorrhage                            characterized by adherent blood, erosions and                            friability was found in the entire examined                            stomach. Biopsies were taken with a cold forceps                            for Helicobacter pylori testing.                           The examined duodenum was normal. Complications:            No immediate complications. Estimated Blood Loss:     Estimated blood loss was minimal. Impression:               - LA Grade C reflux esophagitis.                           - Benign-appearing esophageal stenosis. Dilated.                           - No specimens collected. Recommendation:           - Patient has a contact number available for                            emergencies. The signs and symptoms of potential                            delayed complications were discussed with the                             patient. Return to normal activities tomorrow.                            Written discharge instructions were provided to the                            patient.                           - Resume previous diet.                           - Resume previous diet.                           - Continue present medications. Prevacid 30mg  twice  daily                           - Await pathology results.                           - No ibuprofen, naproxen, or other non-steroidal                            anti-inflammatory drugs.                           - Follow an antireflux regimen indefinitely. his                            includes:                           - Do not lie down for at least 3 to 4 hours after                            meals.                           - Raise the head of the bed 4 to 6 inches.                           - Decrease excess weight.                           - Avoid citrus juices and other acidic foods,                            alcohol, chocolate, mints, coffee and other                            caffeinated beverages, carbonated beverages, fatty                            and fried foods.                           - Avoid tight-fitting clothing.                           - Avoid cigarettes and other tobacco products.                           Return in 6-8 weeks for follow up visit in GI office Mauri Pole, MD 03/25/2018 10:18:52 AM This report has been signed electronically.

## 2018-03-26 ENCOUNTER — Telehealth: Payer: Self-pay

## 2018-03-26 NOTE — Telephone Encounter (Signed)
  Follow up Call-  Call back number 03/25/2018 12/16/2017  Post procedure Call Back phone  # [phone] cell [phone] or cell [phone]  Permission to leave phone message Yes Yes  Some recent data might be hidden     Patient questions:  Do you have a fever, pain , or abdominal swelling? No. Pain Score  0 *  Have you tolerated food without any problems? Yes.    Have you been able to return to your normal activities? Yes.    Do you have any questions about your discharge instructions: Diet   No. Medications  No. Follow up visit  No.  Do you have questions or concerns about your Care? No.  Actions: * If pain score is 4 or above: No action needed, pain <4.

## 2018-03-30 ENCOUNTER — Encounter: Payer: Self-pay | Admitting: Gastroenterology

## 2018-03-30 DIAGNOSIS — K2961 Other gastritis with bleeding: Secondary | ICD-10-CM

## 2018-03-30 MED ORDER — SUCRALFATE 1 G PO TABS: 1.0000 g | ORAL_TABLET | Freq: Two times a day (BID) | ORAL | 3 refills | Status: DC

## 2018-04-17 ENCOUNTER — Encounter (HOSPITAL_COMMUNITY): Payer: Self-pay

## 2018-04-17 ENCOUNTER — Ambulatory Visit (HOSPITAL_COMMUNITY)
Admission: RE | Admit: 2018-04-17 | Discharge: 2018-04-17 | Disposition: A | Discharge status: Home / Self Care | Payer: Medicare Other | Source: Ambulatory Visit | Attending: Internal Medicine | Admitting: Internal Medicine

## 2018-04-17 DIAGNOSIS — M810 Age-related osteoporosis without current pathological fracture: Secondary | ICD-10-CM | POA: Diagnosis present

## 2018-04-17 MED ORDER — DENOSUMAB 60 MG/ML ~~LOC~~ SOSY
60.0000 mg | PREFILLED_SYRINGE | Freq: Once | SUBCUTANEOUS | Status: AC
  Administered 2018-04-17: 60 mg via SUBCUTANEOUS
  Filled 2018-04-17: qty 1

## 2018-04-17 NOTE — Discharge Instructions (Signed)
Denosumab injection °What is this medicine? °DENOSUMAB (den oh sue mab) slows bone breakdown. Prolia is used to treat osteoporosis in women after menopause and in men, and in people who are taking corticosteroids for 6 months or more. Xgeva is used to treat a high calcium level due to cancer and to prevent bone fractures and other bone problems caused by multiple myeloma or cancer bone metastases. Xgeva is also used to treat giant cell tumor of the bone. °This medicine may be used for other purposes; ask your health care provider or pharmacist if you have questions. °COMMON BRAND NAME(S): Prolia, XGEVA °What should I tell my health care provider before I take this medicine? °They need to know if you have any of these conditions: °-dental disease °-having surgery or tooth extraction °-infection °-kidney disease °-low levels of calcium or Vitamin D in the blood °-malnutrition °-on hemodialysis °-skin conditions or sensitivity °-thyroid or parathyroid disease °-an unusual reaction to denosumab, other medicines, foods, dyes, or preservatives °-pregnant or trying to get pregnant °-breast-feeding °How should I use this medicine? °This medicine is for injection under the skin. It is given by a health care professional in a hospital or clinic setting. °A special MedGuide will be given to you before each treatment. Be sure to read this information carefully each time. °For Prolia, talk to your pediatrician regarding the use of this medicine in children. Special care may be needed. For Xgeva, talk to your pediatrician regarding the use of this medicine in children. While this drug may be prescribed for children as young as 13 years for selected conditions, precautions do apply. °Overdosage: If you think you have taken too much of this medicine contact a poison control center or emergency room at once. °NOTE: This medicine is only for you. Do not share this medicine with others. °What if I miss a dose? °It is important not to  miss your dose. Call your doctor or health care professional if you are unable to keep an appointment. °What may interact with this medicine? °Do not take this medicine with any of the following medications: °-other medicines containing denosumab °This medicine may also interact with the following medications: °-medicines that lower your chance of fighting infection °-steroid medicines like prednisone or cortisone °This list may not describe all possible interactions. Give your health care provider a list of all the medicines, herbs, non-prescription drugs, or dietary supplements you use. Also tell them if you smoke, drink alcohol, or use illegal drugs. Some items may interact with your medicine. °What should I watch for while using this medicine? °Visit your doctor or health care professional for regular checks on your progress. Your doctor or health care professional may order blood tests and other tests to see how you are doing. °Call your doctor or health care professional for advice if you get a fever, chills or sore throat, or other symptoms of a cold or flu. Do not treat yourself. This drug may decrease your body's ability to fight infection. Try to avoid being around people who are sick. °You should make sure you get enough calcium and vitamin D while you are taking this medicine, unless your doctor tells you not to. Discuss the foods you eat and the vitamins you take with your health care professional. °See your dentist regularly. Brush and floss your teeth as directed. Before you have any dental work done, tell your dentist you are receiving this medicine. °Do not become pregnant while taking this medicine or for 5 months   after stopping it. Talk with your doctor or health care professional about your birth control options while taking this medicine. Women should inform their doctor if they wish to become pregnant or think they might be pregnant. There is a potential for serious side effects to an unborn  child. Talk to your health care professional or pharmacist for more information. °What side effects may I notice from receiving this medicine? °Side effects that you should report to your doctor or health care professional as soon as possible: °-allergic reactions like skin rash, itching or hives, swelling of the face, lips, or tongue °-bone pain °-breathing problems °-dizziness °-jaw pain, especially after dental work °-redness, blistering, peeling of the skin °-signs and symptoms of infection like fever or chills; cough; sore throat; pain or trouble passing urine °-signs of low calcium like fast heartbeat, muscle cramps or muscle pain; pain, tingling, numbness in the hands or feet; seizures °-unusual bleeding or bruising °-unusually weak or tired °Side effects that usually do not require medical attention (report to your doctor or health care professional if they continue or are bothersome): °-constipation °-diarrhea °-headache °-joint pain °-loss of appetite °-muscle pain °-runny nose °-tiredness °-upset stomach °This list may not describe all possible side effects. Call your doctor for medical advice about side effects. You may report side effects to FDA at 1-800-FDA-1088. °Where should I keep my medicine? °This medicine is only given in a clinic, doctor's office, or other health care setting and will not be stored at home. °NOTE: This sheet is a summary. It may not cover all possible information. If you have questions about this medicine, talk to your doctor, pharmacist, or health care provider. °© 2019 [employer] (2017-06-13 16:10:44) ° °

## 2018-05-18 ENCOUNTER — Ambulatory Visit: Payer: Medicare Other | Admitting: Gastroenterology

## 2018-06-24 ENCOUNTER — Ambulatory Visit (INDEPENDENT_AMBULATORY_CARE_PROVIDER_SITE_OTHER): Payer: Medicare Other | Admitting: Infectious Disease

## 2018-06-24 ENCOUNTER — Other Ambulatory Visit: Payer: Self-pay

## 2018-06-24 DIAGNOSIS — J479 Bronchiectasis, uncomplicated: Secondary | ICD-10-CM

## 2018-06-24 DIAGNOSIS — A310 Pulmonary mycobacterial infection: Secondary | ICD-10-CM | POA: Diagnosis not present

## 2018-06-24 MED ORDER — RIFAMPIN 300 MG PO CAPS: 600.0000 mg | ORAL_CAPSULE | Freq: Every day | ORAL | 11 refills | Status: DC

## 2018-06-24 MED ORDER — ETHAMBUTOL HCL 400 MG PO TABS: 800.0000 mg | ORAL_TABLET | Freq: Every day | ORAL | 11 refills | Status: DC

## 2018-06-24 MED ORDER — AZITHROMYCIN 500 MG PO TABS: 500.0000 mg | ORAL_TABLET | Freq: Every day | ORAL | 11 refills | Status: DC

## 2018-06-24 NOTE — Progress Notes (Signed)
Virtual Visit via Telephone Note  I connected with [name] on 06/24/18 at  9:00 AM EDT by telephone and verified that I am speaking with the correct person using two identifiers.  Location: Patient: Home Provider: [facility]  I discussed the limitations, risks, security and privacy concerns of performing an evaluation and management service by telephone and the availability of in person appointments. I also discussed with the patient that there may be a patient responsible charge related to this service. The patient expressed understanding and agreed to proceed.   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 In March of 2019 she had "the flu"--thought is sounds more like a GI virus. She could not tolerate her meds and then decided to see how she did off of them.  IN early May she had an episode of bronchiectasis which was first time she ever had this.   She saw Dr. Halford Chessman with [facility] and received empiric abx.  She was  not producing sufficient sputum she believes to produce one that we can test for AFB.  But she wascoughing every day.  She is also having increasing fatigue.\   We restarted her on empiric rx for M avium and this did result in improvement in her cough.  Intent is to have a daily cough but no fevers chills or systemic symptoms.  Her cough is much better with her on her anti-M avium drugs.  She is doing her best to shelter in place and avoid public places though at times she has to go to the grocery store and she wears a mask.    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  Prevention of novel coronavirus 2019: She will continue to shelter in place  Follow Up Instructions:    I discussed the assessment and treatment plan with the patient. The patient was provided an opportunity to ask questions and all were answered. The patient agreed with the plan and demonstrated an understanding of the instructions.   The patient was advised to call back or seek an in-person evaluation if the symptoms worsen or if the condition fails to improve as anticipated.  I provided 22 minutes of non-face-to-face time during this encounter.   Alcide Evener, MD

## 2018-06-30 ENCOUNTER — Telehealth: Payer: Self-pay | Admitting: General Surgery

## 2018-06-30 ENCOUNTER — Encounter: Payer: Self-pay | Admitting: General Surgery

## 2018-06-30 NOTE — Telephone Encounter (Signed)
Called patient to pre-screen, her daughter stated she will have her call the office back in a half hour.

## 2018-07-01 ENCOUNTER — Other Ambulatory Visit: Payer: Self-pay

## 2018-07-01 ENCOUNTER — Ambulatory Visit (INDEPENDENT_AMBULATORY_CARE_PROVIDER_SITE_OTHER): Payer: Medicare Other | Admitting: Gastroenterology

## 2018-07-01 ENCOUNTER — Encounter: Payer: Self-pay | Admitting: Gastroenterology

## 2018-07-01 VITALS — Ht 66.5 in | Wt 130.0 lb

## 2018-07-01 DIAGNOSIS — K2970 Gastritis, unspecified, without bleeding: Secondary | ICD-10-CM

## 2018-07-01 DIAGNOSIS — K222 Esophageal obstruction: Secondary | ICD-10-CM | POA: Diagnosis not present

## 2018-07-01 DIAGNOSIS — R1319 Other dysphagia: Secondary | ICD-10-CM

## 2018-07-01 DIAGNOSIS — K2961 Other gastritis with bleeding: Secondary | ICD-10-CM

## 2018-07-01 DIAGNOSIS — R1310 Dysphagia, unspecified: Secondary | ICD-10-CM | POA: Diagnosis not present

## 2018-07-01 DIAGNOSIS — K2990 Gastroduodenitis, unspecified, without bleeding: Secondary | ICD-10-CM

## 2018-07-01 MED ORDER — LANSOPRAZOLE 30 MG PO TBDD: 30.0000 mg | DELAYED_RELEASE_TABLET | Freq: Two times a day (BID) | ORAL | 3 refills | Status: DC

## 2018-07-01 MED ORDER — SUCRALFATE 1 G PO TABS: 1.0000 g | ORAL_TABLET | Freq: Two times a day (BID) | ORAL | 11 refills | Status: DC

## 2018-07-01 NOTE — Progress Notes (Signed)
[name]    [id number]    Oct 25, 1940  Primary Care Physician:Paterson, Quillian Quince, MD  Referring Physician: Leanna Battles, [address] [place], [place] [id number]  This service was provided via audio only telemedicine (D[employer]) due to [place] 19 pandemic.  Patient location: Home Provider location: Office Used 2 patient identifiers to confirm the correct person. Explained the limitations in evaluation and management via telemedicine. Patient is aware of potential medical charges for this visit.  Patient consented to this virtual visit.  The persons participating in this telemedicine service were myself and the patient  Interactive audio and video telecommunications were attempted between this provider and patient, however failed, due to patient having technical difficulties OR patient did not have access to video capability. We continued and completed visit with audio only.   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  EGD December 16, 2017: Tight esophageal stricture approximately 10 mm size, dilated with TTS balloon to 13 mm.  Esophagus biopsies consistent with eosinophilic esophagitis.  Gastritis H. pylori negative.  EGD 03/25/2018: LA grade grade C esophagitis, esophageal stricture with a 12 mm, dilated to 15.5 mm with TTS balloon, gastritis.  She continues to have intermittent dysphagia with large pills, is cutting them up otherwise overall much better.  Not having any trouble swallowing food.  Denies any pain on swallowing or food impaction.  She is taking lansoprazole twice daily.  Denies any decreased appetite or weight loss.  Denies any nausea, vomiting, abdominal pain, melena or bright red blood per rectum    Outpatient Encounter Medications as of 07/01/2018  Medication Sig  . azithromycin (ZITHROMAX) 500 MG tablet Take 1 tablet (500 mg total) by mouth daily.  . cholecalciferol  (VITAMIN D) 1000 units tablet Take 50,000 Units by mouth. Reported on 08/02/2015  . cyanocobalamin 1000 MCG tablet Take 500 mcg by mouth daily.   Marland Kitchen denosumab (PROLIA) 60 MG/ML SOSY injection Prolia  . ethambutol (MYAMBUTOL) 400 MG tablet Take 2 tablets (800 mg total) by mouth daily.  . fexofenadine (ALLEGRA) 180 MG tablet Take 180 mg by mouth daily as needed for allergies. Reported on 04/14/2015  . fluticasone (FLOVENT HFA) 220 MCG/ACT inhaler 2 PUFFS TWICE DAILY SWALLOWED NOT INHALED.  Marland Kitchen guaiFENesin (MUCINEX) 600 MG 12 hr tablet Take 600 mg by mouth 2 (two) times daily. Reported on 08/02/2015  . lansoprazole (PREVACID SOLUTAB) 30 MG disintegrating tablet Take 1 tablet (30 mg total) by mouth 2 (two) times daily before a meal.  . Probiotic Product (PROBIOTIC PO) Take by mouth as directed.   . promethazine (PHENERGAN) 25 MG tablet Take 25 mg by mouth every 6 (six) hours as needed for nausea or vomiting.  . propranolol (INDERAL) 10 MG tablet Take 10 mg by mouth daily.  Marland Kitchen Respiratory Therapy Supplies (FLUTTER) DEVI Use as directed  . rifampin (RIFADIN) 300 MG capsule Take 2 capsules (600 mg total) by mouth daily.  . sucralfate (CARAFATE) 1 g tablet Take 1 tablet (1 g total) by mouth 2 (two) times daily.  . SUMAtriptan (IMITREX) 100 MG tablet Take 100 mg by mouth as needed. Reported on 04/14/2015  . topiramate (TOPAMAX) 200 MG tablet Take 200 mg by mouth daily. Reported on 04/14/2015   Facility-Administered Encounter Medications as of 07/01/2018  Medication  . 0.9 %  sodium chloride infusion    Allergies as of 07/01/2018 - Review Complete 07/01/2018  Allergen Reaction Noted  . Codeine  Nausea And Vomiting 03/24/2006  . Droperidol  03/24/2006  . Meperidine hcl Nausea And Vomiting 03/24/2006  . Pb-hyoscy-atropine-scopolamine  08/15/2017  . Sulfasalazine Other (See Comments) 03/24/2006  . Sulfonamide derivatives  03/24/2006  . Barbiturates Rash 03/24/2006  . Phenobarbital Rash     Past Medical  History:  Diagnosis Date  . Allergy    SEASONAL  . Anemia   . Breast cancer, right ([place])   . Bronchiectasis ([place])   . Bronchiectasis ([place]) 11/30/2014  . Cataract    bil removed - with lense implant  . Dilation of biliary tract   . Diverticulosis   . Dysphagia 11/18/2017  . Dyspnea    with exertion   . Fatigue 07/16/2017  . GERD (gastroesophageal reflux disease)   . History of radiation therapy   . 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 Low serum vitamin D 03/06/2015  . Lumbar disc herniation   . Malaise 11/18/2017  . Migraine   . Nausea 07/16/2017  . Osteopenia   . Osteoporosis    EARLY STAGES  . Ovarian cyst   . Plantar fasciitis   . Pneumonia   . 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 Vitamin B12 deficiency   . Weight loss 11/30/2014    Past Surgical History:  Procedure Laterality Date  . ABDOMINAL HYSTERECTOMY    . BREAST LUMPECTOMY Right   . CATARACT EXTRACTION Right   . COLONOSCOPY    . LAPAROSCOPIC CHOLECYSTECTOMY    . MASTECTOMY PARTIAL / LUMPECTOMY    . MASTOIDECTOMY    . MYRINGOTOMY     with tympanoplasty and mastoidectomy  . NOSE SURGERY    . TONSILLECTOMY    . TOTAL VAGINAL HYSTERECTOMY    . UPPER GASTROINTESTINAL ENDOSCOPY      Family History  Problem Relation Age of Onset  . Bone cancer Father   . Heart disease Father 33       CAD-CABG  . Leukemia Mother   . Parkinsonism Mother   . Heart attack Paternal Uncle   . Colon cancer Neg Hx   . Stomach cancer Neg Hx   . Esophageal cancer Neg Hx   . Rectal cancer Neg Hx     Social History   Socioeconomic History  . Marital status: Widowed    Spouse name: Not on file  . Number of children: 2  . Years of education: Not on file  . Highest education level: Not on file  Occupational History  . Occupation: Retired  Scientific laboratory technician  . Financial resource strain: Not on file  . Food insecurity:    Worry: Not on file    Inability: Not on file  . Transportation needs:     Medical: Not on file    Non-medical: Not on file  Tobacco Use  . Smoking status: Never Smoker  . Smokeless tobacco: Never Used  Substance and Sexual Activity  . Alcohol use: No    Alcohol/week: 0.0 standard drinks  . Drug use: No  . Sexual activity: Not Currently  Lifestyle  . Physical activity:    Days per week: Not on file    Minutes per session: Not on file  . Stress: Not on file  Relationships  . Social connections:    Talks on phone: Not on file    Gets together: Not on file    Attends religious service: Not on file    Active member of club or organization: Not on file    Attends  meetings of clubs or organizations: Not on file    Relationship status: Not on file  . Intimate partner violence:    Fear of current or ex partner: Not on file    Emotionally abused: Not on file    Physically abused: Not on file    Forced sexual activity: Not on file  Other Topics Concern  . Not on file  Social History Narrative   Daily caffeine       Review of systems: Review of Systems as per HPI All other systems reviewed and are negative.   Physical Exam: Vitals were not taken and physical exam was not performed during this virtual visit.  Data Reviewed:  Reviewed labs, radiology imaging, old records and pertinent past GI work up   Assessment and Plan/Recommendations:  78 year old female history of Mycobacterium avium complex, chronic GERD, eosinophilic esophagitis and esophageal stricture with dysphagia Intermittent pill dysphagia otherwise symptoms overall improved status post EGD with dilation to 15.5 mm. Advised patient to continue to monitor symptoms.  Will hold off repeat EGD for now given her symptoms are stable. Patient is worried about potential exposure during this current pandemic with COVID-19 Continue to avoid tough meat and dense bread Continue Prevacid twice daily Anti-reflux measures  Follow-up in 3 to 4 months or sooner if needed    K. Denzil Magnuson , MD   CC: Leanna Battles, MD

## 2018-07-01 NOTE — Patient Instructions (Addendum)
Continue Prevacid twice daily please send 90-day supply with refill for a year (Your prescriptions has been sent to your pharmacy)  Continue Carafate twice daily as needed, refill for a year  Anti-reflux measures  Follow-up telemedicine visit in 3 to 4 months   Gastroesophageal Reflux Disease, Adult Gastroesophageal reflux (GER) happens when acid from the stomach flows up into the tube that connects the mouth and the stomach (esophagus). Normally, food travels down the esophagus and stays in the stomach to be digested. However, when a person has GER, food and stomach acid sometimes move back up into the esophagus. If this becomes a more serious problem, the person may be diagnosed with a disease called gastroesophageal reflux disease (GERD). GERD occurs when the reflux:  Happens often.  Causes frequent or severe symptoms.  Causes problems such as damage to the esophagus. When stomach acid comes in contact with the esophagus, the acid may cause soreness (inflammation) in the esophagus. Over time, GERD may create small holes (ulcers) in the lining of the esophagus. What are the causes? This condition is caused by a problem with the muscle between the esophagus and the stomach (lower esophageal sphincter, or LES). Normally, the LES muscle closes after food passes through the esophagus to the stomach. When the LES is weakened or abnormal, it does not close properly, and that allows food and stomach acid to go back up into the esophagus. The LES can be weakened by certain dietary substances, medicines, and medical conditions, including:  Tobacco use.  Pregnancy.  Having a hiatal hernia.  Alcohol use.  Certain foods and beverages, such as coffee, chocolate, onions, and peppermint. What increases the risk? You are more likely to develop this condition if you:  Have an increased body weight.  Have a connective tissue disorder.  Use NSAID medicines. What are the signs or  symptoms? Symptoms of this condition include:  Heartburn.  Difficult or painful swallowing.  The feeling of having a lump in the throat.  Abitter taste in the mouth.  Bad breath.  Having a large amount of saliva.  Having an upset or bloated stomach.  Belching.  Chest pain. Different conditions can cause chest pain. Make sure you see your health care provider if you experience chest pain.  Shortness of breath or wheezing.  Ongoing (chronic) cough or a night-time cough.  Wearing away of tooth enamel.  Weight loss. How is this diagnosed? Your health care provider will take a medical history and perform a physical exam. To determine if you have mild or severe GERD, your health care provider may also monitor how you respond to treatment. You may also have tests, including:  A test to examine your stomach and esophagus with a small camera (endoscopy).  A test thatmeasures the acidity level in your esophagus.  A test thatmeasures how much pressure is on your esophagus.  A barium swallow or modified barium swallow test to show the shape, size, and functioning of your esophagus. How is this treated? The goal of treatment is to help relieve your symptoms and to prevent complications. Treatment for this condition may vary depending on how severe your symptoms are. Your health care provider may recommend:  Changes to your diet.  Medicine.  Surgery. Follow these instructions at home: Eating and drinking   Follow a diet as recommended by your health care provider. This may involve avoiding foods and drinks such as: ? Coffee and tea (with or without caffeine). ? Drinks that containalcohol. ? Energy drinks  and sports drinks. ? Carbonated drinks or sodas. ? Chocolate and cocoa. ? Peppermint and mint flavorings. ? Garlic and onions. ? Horseradish. ? Spicy and acidic foods, including peppers, chili powder, curry powder, vinegar, hot sauces, and barbecue sauce. ? Citrus  fruit juices and citrus fruits, such as oranges, lemons, and limes. ? Tomato-based foods, such as red sauce, chili, salsa, and pizza with red sauce. ? Fried and fatty foods, such as donuts, french fries, potato chips, and high-fat dressings. ? High-fat meats, such as hot dogs and fatty cuts of red and white meats, such as rib eye steak, sausage, ham, and bacon. ? High-fat dairy items, such as whole milk, butter, and cream cheese.  Eat small, frequent meals instead of large meals.  Avoid drinking large amounts of liquid with your meals.  Avoid eating meals during the 2-3 hours before bedtime.  Avoid lying down right after you eat.  Do not exercise right after you eat. Lifestyle   Do not use any products that contain nicotine or tobacco, such as cigarettes, e-cigarettes, and chewing tobacco. If you need help quitting, ask your health care provider.  Try to reduce your stress by using methods such as yoga or meditation. If you need help reducing stress, ask your health care provider.  If you are overweight, reduce your weight to an amount that is healthy for you. Ask your health care provider for guidance about a safe weight loss goal. General instructions  Pay attention to any changes in your symptoms.  Take over-the-counter and prescription medicines only as told by your health care provider. Do not take aspirin, ibuprofen, or other NSAIDs unless your health care provider told you to do so.  Wear loose-fitting clothing. Do not wear anything tight around your waist that causes pressure on your abdomen.  Raise (elevate) the head of your bed about 6 inches (15 cm).  Avoid bending over if this makes your symptoms worse.  Keep all follow-up visits as told by your health care provider. This is important. Contact a health care provider if:  You have: ? New symptoms. ? Unexplained weight loss. ? Difficulty swallowing or it hurts to swallow. ? Wheezing or a persistent cough. ? A  hoarse voice.  Your symptoms do not improve with treatment. Get help right away if you:  Have pain in your arms, neck, jaw, teeth, or back.  Feel sweaty, dizzy, or light-headed.  Have chest pain or shortness of breath.  Vomit and your vomit looks like blood or coffee grounds.  Faint.  Have stool that is bloody or black.  Cannot swallow, drink, or eat. Summary  Gastroesophageal reflux happens when acid from the stomach flows up into the esophagus. GERD is a disease in which the reflux happens often, causes frequent or severe symptoms, or causes problems such as damage to the esophagus.  Treatment for this condition may vary depending on how severe your symptoms are. Your health care provider may recommend diet and lifestyle changes, medicine, or surgery.  Contact a health care provider if you have new or worsening symptoms.  Take over-the-counter and prescription medicines only as told by your health care provider. Do not take aspirin, ibuprofen, or other NSAIDs unless your health care provider told you to do so.  Keep all follow-up visits as told by your health care provider. This is important. This information is not intended to replace advice given to you by your health care provider. Make sure you discuss any questions you have with your  health care provider. Document Released: 11/14/2004 Document Revised: 08/13/2017 Document Reviewed: 08/13/2017 Elsevier Interactive Patient Education  2019 [employer].  I appreciate the  opportunity to care for you  Thank You   Harl Bowie , MD

## 2018-09-22 ENCOUNTER — Encounter: Payer: Self-pay | Admitting: Gastroenterology

## 2018-10-19 ENCOUNTER — Other Ambulatory Visit: Payer: Self-pay

## 2018-10-19 ENCOUNTER — Encounter (HOSPITAL_COMMUNITY): Payer: Self-pay

## 2018-10-19 ENCOUNTER — Ambulatory Visit (HOSPITAL_COMMUNITY)
Admission: RE | Admit: 2018-10-19 | Discharge: 2018-10-19 | Disposition: A | Discharge status: Home / Self Care | Payer: Medicare Other | Source: Ambulatory Visit | Attending: Internal Medicine | Admitting: Internal Medicine

## 2018-10-19 DIAGNOSIS — M810 Age-related osteoporosis without current pathological fracture: Secondary | ICD-10-CM | POA: Insufficient documentation

## 2018-10-19 MED ORDER — DENOSUMAB 60 MG/ML ~~LOC~~ SOSY
60.0000 mg | PREFILLED_SYRINGE | Freq: Once | SUBCUTANEOUS | Status: AC
  Administered 2018-10-19: 60 mg via SUBCUTANEOUS
  Filled 2018-10-19: qty 1

## 2018-10-19 NOTE — Discharge Instructions (Signed)
Denosumab injection °What is this medicine? °DENOSUMAB (den oh sue mab) slows bone breakdown. Prolia is used to treat osteoporosis in women after menopause and in men, and in people who are taking corticosteroids for 6 months or more. Xgeva is used to treat a high calcium level due to cancer and to prevent bone fractures and other bone problems caused by multiple myeloma or cancer bone metastases. Xgeva is also used to treat giant cell tumor of the bone. °This medicine may be used for other purposes; ask your health care provider or pharmacist if you have questions. °COMMON BRAND NAME(S): Prolia, XGEVA °What should I tell my health care provider before I take this medicine? °They need to know if you have any of these conditions: °· dental disease °· having surgery or tooth extraction °· infection °· kidney disease °· low levels of calcium or Vitamin D in the blood °· malnutrition °· on hemodialysis °· skin conditions or sensitivity °· thyroid or parathyroid disease °· an unusual reaction to denosumab, other medicines, foods, dyes, or preservatives °· pregnant or trying to get pregnant °· breast-feeding °How should I use this medicine? °This medicine is for injection under the skin. It is given by a health care professional in a hospital or clinic setting. °A special MedGuide will be given to you before each treatment. Be sure to read this information carefully each time. °For Prolia, talk to your pediatrician regarding the use of this medicine in children. Special care may be needed. For Xgeva, talk to your pediatrician regarding the use of this medicine in children. While this drug may be prescribed for children as young as 13 years for selected conditions, precautions do apply. °Overdosage: If you think you have taken too much of this medicine contact a poison control center or emergency room at once. °NOTE: This medicine is only for you. Do not share this medicine with others. °What if I miss a dose? °It is  important not to miss your dose. Call your doctor or health care professional if you are unable to keep an appointment. °What may interact with this medicine? °Do not take this medicine with any of the following medications: °· other medicines containing denosumab °This medicine may also interact with the following medications: °· medicines that lower your chance of fighting infection °· steroid medicines like prednisone or cortisone °This list may not describe all possible interactions. Give your health care provider a list of all the medicines, herbs, non-prescription drugs, or dietary supplements you use. Also tell them if you smoke, drink alcohol, or use illegal drugs. Some items may interact with your medicine. °What should I watch for while using this medicine? °Visit your doctor or health care professional for regular checks on your progress. Your doctor or health care professional may order blood tests and other tests to see how you are doing. °Call your doctor or health care professional for advice if you get a fever, chills or sore throat, or other symptoms of a cold or flu. Do not treat yourself. This drug may decrease your body's ability to fight infection. Try to avoid being around people who are sick. °You should make sure you get enough calcium and vitamin D while you are taking this medicine, unless your doctor tells you not to. Discuss the foods you eat and the vitamins you take with your health care professional. °See your dentist regularly. Brush and floss your teeth as directed. Before you have any dental work done, tell your dentist you are   receiving this medicine. Do not become pregnant while taking this medicine or for 5 months after stopping it. Talk with your doctor or health care professional about your birth control options while taking this medicine. Women should inform their doctor if they wish to become pregnant or think they might be pregnant. There is a potential for serious side  effects to an unborn child. Talk to your health care professional or pharmacist for more information. What side effects may I notice from receiving this medicine? Side effects that you should report to your doctor or health care professional as soon as possible:  allergic reactions like skin rash, itching or hives, swelling of the face, lips, or tongue  bone pain  breathing problems  dizziness  jaw pain, especially after dental work  redness, blistering, peeling of the skin  signs and symptoms of infection like fever or chills; cough; sore throat; pain or trouble passing urine  signs of low calcium like fast heartbeat, muscle cramps or muscle pain; pain, tingling, numbness in the hands or feet; seizures  unusual bleeding or bruising  unusually weak or tired Side effects that usually do not require medical attention (report to your doctor or health care professional if they continue or are bothersome):  constipation  diarrhea  headache  joint pain  loss of appetite  muscle pain  runny nose  tiredness  upset stomach This list may not describe all possible side effects. Call your doctor for medical advice about side effects. You may report side effects to FDA at 1-800-FDA-1088. Where should I keep my medicine? This medicine is only given in a clinic, doctor's office, or other health care setting and will not be stored at home. NOTE: This sheet is a summary. It may not cover all possible information. If you have questions about this medicine, talk to your doctor, pharmacist, or health care provider.  2020 Elsevier/Gold Standard (2017-06-13 16:10:44)

## 2018-11-03 ENCOUNTER — Ambulatory Visit (INDEPENDENT_AMBULATORY_CARE_PROVIDER_SITE_OTHER): Payer: Medicare Other | Admitting: Gastroenterology

## 2018-11-03 ENCOUNTER — Other Ambulatory Visit: Payer: Self-pay

## 2018-11-03 ENCOUNTER — Encounter: Payer: Self-pay | Admitting: Gastroenterology

## 2018-11-03 VITALS — BP 115/60 | HR 67 | Temp 98.0°F | Ht 66.5 in | Wt 131.0 lb

## 2018-11-03 DIAGNOSIS — R1310 Dysphagia, unspecified: Secondary | ICD-10-CM

## 2018-11-03 DIAGNOSIS — K219 Gastro-esophageal reflux disease without esophagitis: Secondary | ICD-10-CM | POA: Diagnosis not present

## 2018-11-03 DIAGNOSIS — K222 Esophageal obstruction: Secondary | ICD-10-CM

## 2018-11-03 NOTE — Progress Notes (Signed)
[name]    [id number]    22-Dec-1940  Primary Care Physician:Paterson, Quillian Quince, MD  Referring Physician: Leanna Battles, MD [address] [place],  [place] [address]   Chief complaint: Dysphagia HPI: 78 year old female with history of eosinophilic esophagitis and esophageal stricture, chronic GERD here for follow-up visit . Continues to have intermittent dysphagia especially with large pills .  She is tolerating soft diet and full liquids well, has gained back weight and is able to maintain it.  She is continuing to drink 1 Ensure a day.   She is taking lansoprazole twice daily but she has trouble swallowing Carafate, if she is not able to get it down in 1 swallow, she feels it disintegrates fast.  Denies any nausea, vomiting, abdominal pain, melena or bright red blood per rectum  Relevant GI history: EGD December 16, 2017: Tight esophageal stricture approximately 10 mm size, dilated with TTS balloon to 13 mm.  Esophagus biopsies consistent with eosinophilic esophagitis.  Gastritis H. pylori negative.  EGD 03/25/2018: LA grade grade C esophagitis, esophageal stricture with a 12 mm, dilated to 15.5 mm with TTS balloon, gastritis. Outpatient Encounter Medications as of 11/03/2018  Medication Sig  . azithromycin (ZITHROMAX) 500 MG tablet Take 1 tablet (500 mg total) by mouth daily.  . cholecalciferol (VITAMIN D) 1000 units tablet Take 50,000 Units by mouth. Reported on 08/02/2015  . cyanocobalamin 1000 MCG tablet Take 500 mcg by mouth daily.   Marland Kitchen denosumab (PROLIA) 60 MG/ML SOSY injection Prolia  . ethambutol (MYAMBUTOL) 400 MG tablet Take 2 tablets (800 mg total) by mouth daily.  . fexofenadine (ALLEGRA) 180 MG tablet Take 180 mg by mouth daily as needed for allergies. Reported on 04/14/2015  . guaiFENesin (MUCINEX) 600 MG 12 hr tablet Take 600 mg by mouth 2 (two) times daily. Reported on 08/02/2015  . lansoprazole (PREVACID SOLUTAB) 30 MG disintegrating tablet Take 1  tablet (30 mg total) by mouth 2 (two) times daily before a meal.  . promethazine (PHENERGAN) 25 MG tablet Take 25 mg by mouth every 6 (six) hours as needed for nausea or vomiting.  . propranolol (INDERAL) 10 MG tablet Take 10 mg by mouth daily.  Marland Kitchen Respiratory Therapy Supplies (FLUTTER) DEVI Use as directed  . rifampin (RIFADIN) 300 MG capsule Take 2 capsules (600 mg total) by mouth daily.  . sucralfate (CARAFATE) 1 g tablet Take 1 tablet (1 g total) by mouth 2 (two) times daily.  . SUMAtriptan (IMITREX) 100 MG tablet Take 100 mg by mouth as needed. Reported on 04/14/2015  . topiramate (TOPAMAX) 200 MG tablet Take 200 mg by mouth daily. Reported on 04/14/2015  . [DISCONTINUED] fluticasone (FLOVENT HFA) 220 MCG/ACT inhaler 2 PUFFS TWICE DAILY SWALLOWED NOT INHALED.  . [DISCONTINUED] Probiotic Product (PROBIOTIC PO) Take by mouth as directed.    Facility-Administered Encounter Medications as of 11/03/2018  Medication  . 0.9 %  sodium chloride infusion    Allergies as of 11/03/2018 - Review Complete 11/03/2018  Allergen Reaction Noted  . Codeine Nausea And Vomiting 03/24/2006  . Droperidol  03/24/2006  . Meperidine hcl Nausea And Vomiting 03/24/2006  . Pb-hyoscy-atropine-scopolamine  08/15/2017  . Sulfasalazine Other (See Comments) 03/24/2006  . Sulfonamide derivatives  03/24/2006  . Barbiturates Rash 03/24/2006  . Phenobarbital Rash     Past Medical History:  Diagnosis Date  . Allergy    SEASONAL  . Anemia   . Breast cancer, right ([place])   .  Bronchiectasis ([place])   . Bronchiectasis ([facility]) 11/30/2014  . Cataract    bil removed - with lense implant  . Dilation of biliary tract   . Diverticulosis   . Dysphagia 11/18/2017  . Dyspnea    with exertion   . Fatigue 07/16/2017  . GERD (gastroesophageal reflux disease)   . History of radiation therapy   . 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 Low serum vitamin D 03/06/2015  . Lumbar disc herniation   . Malaise 11/18/2017  . Migraine   . Nausea  07/16/2017  . Osteopenia   . Osteoporosis    EARLY STAGES  . Ovarian cyst   . Plantar fasciitis   . Pneumonia   . 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 Vitamin B12 deficiency   . Weight loss 11/30/2014    Past Surgical History:  Procedure Laterality Date  . ABDOMINAL HYSTERECTOMY    . BREAST LUMPECTOMY Right   . CATARACT EXTRACTION Right   . COLONOSCOPY    . LAPAROSCOPIC CHOLECYSTECTOMY    . MASTECTOMY PARTIAL / LUMPECTOMY    . MASTOIDECTOMY    . MYRINGOTOMY     with tympanoplasty and mastoidectomy  . NOSE SURGERY    . TONSILLECTOMY    . TOTAL VAGINAL HYSTERECTOMY    . UPPER GASTROINTESTINAL ENDOSCOPY      Family History  Problem Relation Age of Onset  . Bone cancer Father   . Heart disease Father 56       CAD-CABG  . Leukemia Mother   . Parkinsonism Mother   . Heart attack Paternal Uncle   . Colon cancer Neg Hx   . Stomach cancer Neg Hx   . Esophageal cancer Neg Hx   . Rectal cancer Neg Hx     Social History   Socioeconomic History  . Marital status: Widowed    Spouse name: Not on file  . Number of children: 2  . Years of education: Not on file  . Highest education level: Not on file  Occupational History  . Occupation: Retired  Scientific laboratory technician  . Financial resource strain: Not on file  . Food insecurity    Worry: Not on file    Inability: Not on file  . Transportation needs    Medical: Not on file    Non-medical: Not on file  Tobacco Use  . Smoking status: Never Smoker  . Smokeless tobacco: Never Used  Substance and Sexual Activity  . Alcohol use: No    Alcohol/week: 0.0 standard drinks  . Drug use: No  . Sexual activity: Not Currently  Lifestyle  . Physical activity    Days per week: Not on file    Minutes per session: Not on file  . Stress: Not on file  Relationships  . Social Herbalist on phone: Not on file    Gets together: Not on file    Attends religious service: Not on file     Active member of club or organization: Not on file    Attends meetings of clubs or organizations: Not on file    Relationship status: Not on file  . Intimate partner violence    Fear of current or ex partner: Not on file    Emotionally abused: Not on file    Physically abused: Not on file    Forced sexual activity: Not on file  Other Topics Concern  . Not on file  Social History Narrative   Daily  caffeine       Review of systems: Review of Systems  Constitutional: Negative for fever and chills.  HENT: Negative.   Eyes: Negative for blurred vision.  Respiratory: Positive for cough, shortness of breath and wheezing.   Cardiovascular: Negative for chest pain and palpitations.  Gastrointestinal: as per HPI Genitourinary: Negative for dysuria, urgency, frequency and hematuria.  Musculoskeletal: Positive for myalgias, back pain and joint pain.  Skin: Negative for itching and rash.  Neurological: Negative for dizziness, tremors, focal weakness, seizures and loss of consciousness. Frequent headaches Endo/Heme/Allergies: Positive for seasonal allergies.  Psychiatric/Behavioral: Negative for depression, suicidal ideas and hallucinations.  All other systems reviewed and are negative.   Physical Exam: Vitals:   11/03/18 0817  BP: 115/60  Pulse: 67  Temp: 98 F (36.7 C)   Body mass index is 20.83 kg/m. Gen:      No acute distress HEENT:  EOMI, sclera anicteric Neck:     No masses; no thyromegaly Lungs:    Clear to auscultation bilaterally; normal respiratory effort CV:         Regular rate and rhythm; no murmurs Abd:      + bowel sounds; soft, non-tender; no palpable masses, no distension Ext:    No edema; adequate peripheral perfusion Skin:      Warm and dry; no rash Neuro: alert and oriented x 3 Psych: normal mood and affect  Data Reviewed:  Reviewed labs, radiology imaging, old records and pertinent past GI work up   Assessment and Plan/Recommendations:  78 year old  female with history of chronic GERD, eosinophilic esophagitis likely induced by uncontrolled acid reflux and esophageal stricture Persistent pill and solid dysphagia She was dilated to 15.5 mm during the last EGD February 2020, had LA grade C erosive esophagitis Will need repeat EGD to document healing of esophagitis and repeat esophageal dilation Wants to schedule it once her daughter is able to drive her in as she just had surgery.  Continue PPI twice daily Okay to dissolve Carafate and 15 cc water and drink it suspension twice daily as needed  Discussed antireflux measures and lifestyle modifications in detail  Return in 3 months or sooner if needed  25 minutes was spent face-to-face with the patient. Greater than 50% of the time used for counseling as well as treatment plan and follow-up. She had multiple questions which were answered to her satisfaction  K. Denzil Magnuson , MD    CC: Leanna Battles, MD

## 2018-11-03 NOTE — Patient Instructions (Signed)
It has been recommended to you by your physician that you have a(n) Endoscopy completed. Per your request, we did not schedule the procedure(s) today. Please contact our office at [phone] should you decide to have the procedure completed.  Continue PPI twice daily before breakfast and dinner  Continue Carafate as needed dissolve in 10-15cc water to make a slurry   Gastroesophageal Reflux Disease, Adult Gastroesophageal reflux (GER) happens when acid from the stomach flows up into the tube that connects the mouth and the stomach (esophagus). Normally, food travels down the esophagus and stays in the stomach to be digested. However, when a person has GER, food and stomach acid sometimes move back up into the esophagus. If this becomes a more serious problem, the person may be diagnosed with a disease called gastroesophageal reflux disease (GERD). GERD occurs when the reflux:  Happens often.  Causes frequent or severe symptoms.  Causes problems such as damage to the esophagus. When stomach acid comes in contact with the esophagus, the acid may cause soreness (inflammation) in the esophagus. Over time, GERD may create small holes (ulcers) in the lining of the esophagus. What are the causes? This condition is caused by a problem with the muscle between the esophagus and the stomach (lower esophageal sphincter, or LES). Normally, the LES muscle closes after food passes through the esophagus to the stomach. When the LES is weakened or abnormal, it does not close properly, and that allows food and stomach acid to go back up into the esophagus. The LES can be weakened by certain dietary substances, medicines, and medical conditions, including:  Tobacco use.  Pregnancy.  Having a hiatal hernia.  Alcohol use.  Certain foods and beverages, such as coffee, chocolate, onions, and peppermint. What increases the risk? You are more likely to develop this condition if you:  Have an increased body  weight.  Have a connective tissue disorder.  Use NSAID medicines. What are the signs or symptoms? Symptoms of this condition include:  Heartburn.  Difficult or painful swallowing.  The feeling of having a lump in the throat.  Abitter taste in the mouth.  Bad breath.  Having a large amount of saliva.  Having an upset or bloated stomach.  Belching.  Chest pain. Different conditions can cause chest pain. Make sure you see your health care provider if you experience chest pain.  Shortness of breath or wheezing.  Ongoing (chronic) cough or a night-time cough.  Wearing away of tooth enamel.  Weight loss. How is this diagnosed? Your health care provider will take a medical history and perform a physical exam. To determine if you have mild or severe GERD, your health care provider may also monitor how you respond to treatment. You may also have tests, including:  A test to examine your stomach and esophagus with a small camera (endoscopy).  A test thatmeasures the acidity level in your esophagus.  A test thatmeasures how much pressure is on your esophagus.  A barium swallow or modified barium swallow test to show the shape, size, and functioning of your esophagus. How is this treated? The goal of treatment is to help relieve your symptoms and to prevent complications. Treatment for this condition may vary depending on how severe your symptoms are. Your health care provider may recommend:  Changes to your diet.  Medicine.  Surgery. Follow these instructions at home: Eating and drinking   Follow a diet as recommended by your health care provider. This may involve avoiding foods and drinks  such as: ? Coffee and tea (with or without caffeine). ? Drinks that containalcohol. ? Energy drinks and sports drinks. ? Carbonated drinks or sodas. ? Chocolate and cocoa. ? Peppermint and mint flavorings. ? Garlic and onions. ? Horseradish. ? Spicy and acidic foods,  including peppers, chili powder, curry powder, vinegar, hot sauces, and barbecue sauce. ? Citrus fruit juices and citrus fruits, such as oranges, lemons, and limes. ? Tomato-based foods, such as red sauce, chili, salsa, and pizza with red sauce. ? Fried and fatty foods, such as donuts, french fries, potato chips, and high-fat dressings. ? High-fat meats, such as hot dogs and fatty cuts of red and white meats, such as rib eye steak, sausage, ham, and bacon. ? High-fat dairy items, such as whole milk, butter, and cream cheese.  Eat small, frequent meals instead of large meals.  Avoid drinking large amounts of liquid with your meals.  Avoid eating meals during the 2-3 hours before bedtime.  Avoid lying down right after you eat.  Do not exercise right after you eat. Lifestyle   Do not use any products that contain nicotine or tobacco, such as cigarettes, e-cigarettes, and chewing tobacco. If you need help quitting, ask your health care provider.  Try to reduce your stress by using methods such as yoga or meditation. If you need help reducing stress, ask your health care provider.  If you are overweight, reduce your weight to an amount that is healthy for you. Ask your health care provider for guidance about a safe weight loss goal. General instructions  Pay attention to any changes in your symptoms.  Take over-the-counter and prescription medicines only as told by your health care provider. Do not take aspirin, ibuprofen, or other NSAIDs unless your health care provider told you to do so.  Wear loose-fitting clothing. Do not wear anything tight around your waist that causes pressure on your abdomen.  Raise (elevate) the head of your bed about 6 inches (15 cm).  Avoid bending over if this makes your symptoms worse.  Keep all follow-up visits as told by your health care provider. This is important. Contact a health care provider if:  You have: ? New symptoms. ? Unexplained weight  loss. ? Difficulty swallowing or it hurts to swallow. ? Wheezing or a persistent cough. ? A hoarse voice.  Your symptoms do not improve with treatment. Get help right away if you:  Have pain in your arms, neck, jaw, teeth, or back.  Feel sweaty, dizzy, or light-headed.  Have chest pain or shortness of breath.  Vomit and your vomit looks like blood or coffee grounds.  Faint.  Have stool that is bloody or black.  Cannot swallow, drink, or eat. Summary  Gastroesophageal reflux happens when acid from the stomach flows up into the esophagus. GERD is a disease in which the reflux happens often, causes frequent or severe symptoms, or causes problems such as damage to the esophagus.  Treatment for this condition may vary depending on how severe your symptoms are. Your health care provider may recommend diet and lifestyle changes, medicine, or surgery.  Contact a health care provider if you have new or worsening symptoms.  Take over-the-counter and prescription medicines only as told by your health care provider. Do not take aspirin, ibuprofen, or other NSAIDs unless your health care provider told you to do so.  Keep all follow-up visits as told by your health care provider. This is important. This information is not intended to replace advice given  to you by your health care provider. Make sure you discuss any questions you have with your health care provider. Document Released: 11/14/2004 Document Revised: 08/13/2017 Document Reviewed: 08/13/2017 Elsevier Patient Education  2020 [place] for Gastroesophageal Reflux Disease, Adult When you have gastroesophageal reflux disease (GERD), the foods you eat and your eating habits are very important. Choosing the right foods can help ease your discomfort. Think about working with a nutrition specialist (dietitian) to help you make good choices. What are tips for following this plan?  Meals  Choose healthy foods that  are low in fat, such as fruits, vegetables, whole grains, low-fat dairy products, and lean meat, fish, and poultry.  Eat small meals often instead of 3 large meals a day. Eat your meals slowly, and in a place where you are relaxed. Avoid bending over or lying down until 2-3 hours after eating.  Avoid eating meals 2-3 hours before bed.  Avoid drinking a lot of liquid with meals.  Cook foods using methods other than frying. Bake, grill, or broil food instead.  Avoid or limit: ? Chocolate. ? Peppermint or spearmint. ? Alcohol. ? Pepper. ? Black and decaffeinated coffee. ? Black and decaffeinated tea. ? Bubbly (carbonated) soft drinks. ? Caffeinated energy drinks and soft drinks.  Limit high-fat foods such as: ? Fatty meat or fried foods. ? Whole milk, cream, butter, or ice cream. ? Nuts and nut butters. ? Pastries, donuts, and sweets made with butter or shortening.  Avoid foods that cause symptoms. These foods may be different for everyone. Common foods that cause symptoms include: ? Tomatoes. ? Oranges, lemons, and limes. ? Peppers. ? Spicy food. ? Onions and garlic. ? Vinegar. Lifestyle  Maintain a healthy weight. Ask your doctor what weight is healthy for you. If you need to lose weight, work with your doctor to do so safely.  Exercise for at least 30 minutes for 5 or more days each week, or as told by your doctor.  Wear loose-fitting clothes.  Do not smoke. If you need help quitting, ask your doctor.  Sleep with the head of your bed higher than your feet. Use a wedge under the mattress or blocks under the bed frame to raise the head of the bed. Summary  When you have gastroesophageal reflux disease (GERD), food and lifestyle choices are very important in easing your symptoms.  Eat small meals often instead of 3 large meals a day. Eat your meals slowly, and in a place where you are relaxed.  Limit high-fat foods such as fatty meat or fried foods.  Avoid bending  over or lying down until 2-3 hours after eating.  Avoid peppermint and spearmint, caffeine, alcohol, and chocolate. This information is not intended to replace advice given to you by your health care provider. Make sure you discuss any questions you have with your health care provider. Document Released: 08/06/2011 Document Revised: 05/28/2018 Document Reviewed: 03/12/2016 Elsevier Patient Education  [place].  I appreciate the  opportunity to care for you  Thank You   Harl Bowie , MD

## 2018-11-16 ENCOUNTER — Ambulatory Visit (INDEPENDENT_AMBULATORY_CARE_PROVIDER_SITE_OTHER): Payer: Medicare Other | Admitting: Infectious Disease

## 2018-11-16 ENCOUNTER — Other Ambulatory Visit: Payer: Self-pay

## 2018-11-16 ENCOUNTER — Encounter: Payer: Self-pay | Admitting: Infectious Disease

## 2018-11-16 VITALS — BP 128/80 | HR 88 | Temp 97.6°F

## 2018-11-16 DIAGNOSIS — A310 Pulmonary mycobacterial infection: Secondary | ICD-10-CM | POA: Diagnosis not present

## 2018-11-16 DIAGNOSIS — J449 Chronic obstructive pulmonary disease, unspecified: Secondary | ICD-10-CM | POA: Diagnosis not present

## 2018-11-16 DIAGNOSIS — Z23 Encounter for immunization: Secondary | ICD-10-CM | POA: Diagnosis not present

## 2018-11-16 DIAGNOSIS — J479 Bronchiectasis, uncomplicated: Secondary | ICD-10-CM

## 2018-11-16 MED ORDER — RIFAMPIN 300 MG PO CAPS: 600.0000 mg | ORAL_CAPSULE | Freq: Every day | ORAL | 11 refills | Status: DC

## 2018-11-16 MED ORDER — AZITHROMYCIN 500 MG PO TABS: 500.0000 mg | ORAL_TABLET | Freq: Every day | ORAL | 11 refills | Status: DC

## 2018-11-16 MED ORDER — ETHAMBUTOL HCL 400 MG PO TABS: 800.0000 mg | ORAL_TABLET | Freq: Every day | ORAL | 11 refills | Status: DC

## 2018-11-16 NOTE — Progress Notes (Signed)
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   MRN: [id number]  HPI  55  old with nodular, bronchiectatic M avium infection who had been tolerating 3 drug rx for M avium. In March of 2019 she had "the flu"--thought is sounds more like a GI virus. She could not tolerate her meds and then decided to see how she did off of them.  IN early May 2019 she had an episode of bronchiectasis which was first time she ever had this.   She saw Dr. Halford Chessman with [facility] and received empiric abx.  She was  not producing sufficient sputum she believed to produce one that we can test for AFB.  But she wascoughing every day.  She is also having increasing fatigue.\   We restarted her on empiric rx for M avium and this did result in improvement in her cough.    Her cough is much better with her on her anti-M avium drugs.  He is also put on weight which he is quite happy about.  She has had problems with eosinophilic esophagitis with heartburn and esophageal stricture being followed by Dr. Silverio Decamp  Past Medical History:  Diagnosis Date  . Allergy    SEASONAL  . Anemia   . Breast cancer, right ([place])   . Bronchiectasis ([place])   . Bronchiectasis ([place]) 11/30/2014  . Cataract    bil removed - with lense implant  . Dilation of biliary tract   . Diverticulosis   . Dysphagia 11/18/2017  . Dyspnea    with exertion   . Fatigue 07/16/2017  . GERD (gastroesophageal reflux disease)   . History of radiation therapy   . 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 Low serum vitamin D 03/06/2015  . Lumbar disc herniation   . Malaise 11/18/2017  . Migraine   . Nausea 07/16/2017  . Osteopenia   . Osteoporosis    EARLY STAGES  . Ovarian cyst   . Plantar fasciitis   . Pneumonia   . 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 Vitamin B12 deficiency   . Weight loss 11/30/2014    Past Surgical History:   Procedure Laterality Date  . ABDOMINAL HYSTERECTOMY    . BREAST LUMPECTOMY Right   . CATARACT EXTRACTION Right   . COLONOSCOPY    . LAPAROSCOPIC CHOLECYSTECTOMY    . MASTECTOMY PARTIAL / LUMPECTOMY    . MASTOIDECTOMY    . MYRINGOTOMY     with tympanoplasty and mastoidectomy  . NOSE SURGERY    . TONSILLECTOMY    . TOTAL VAGINAL HYSTERECTOMY    . UPPER GASTROINTESTINAL ENDOSCOPY      Family History  Problem Relation Age of Onset  . Bone cancer Father   . Heart disease Father 45       CAD-CABG  . Leukemia Mother   . Parkinsonism Mother   . Heart attack Paternal Uncle   . Colon cancer Neg Hx   . Stomach cancer Neg Hx   . Esophageal cancer Neg Hx   . Rectal cancer Neg Hx       Social History   Socioeconomic History  . Marital status: Widowed    Spouse name: Not on file  . Number of children: 2  . Years of education: Not on file  . Highest education level: Not on file  Occupational History  . Occupation: Retired  Scientific laboratory technician  .  Financial resource strain: Not on file  . Food insecurity    Worry: Not on file    Inability: Not on file  . Transportation needs    Medical: Not on file    Non-medical: Not on file  Tobacco Use  . Smoking status: Never Smoker  . Smokeless tobacco: Never Used  Substance and Sexual Activity  . Alcohol use: No    Alcohol/week: 0.0 standard drinks  . Drug use: No  . Sexual activity: Not Currently  Lifestyle  . Physical activity    Days per week: Not on file    Minutes per session: Not on file  . Stress: Not on file  Relationships  . Social Herbalist on phone: Not on file    Gets together: Not on file    Attends religious service: Not on file    Active member of club or organization: Not on file    Attends meetings of clubs or organizations: Not on file    Relationship status: Not on file  Other Topics Concern  . Not on file  Social History Narrative   Daily caffeine     Allergies  Allergen Reactions  . Codeine  Nausea And Vomiting  . Droperidol     Nervous & shaking  . Meperidine Hcl Nausea And Vomiting  . Pb-Hyoscy-Atropine-Scopolamine   . Sulfasalazine Other (See Comments)    REACTION: nausea  . Sulfonamide Derivatives     REACTION: nausea  . Barbiturates Rash  . Phenobarbital Rash     Current Outpatient Medications:  .  azithromycin (ZITHROMAX) 500 MG tablet, Take 1 tablet (500 mg total) by mouth daily., Disp: 30 tablet, Rfl: 11 .  cholecalciferol (VITAMIN D) 1000 units tablet, Take 50,000 Units by mouth. Reported on 08/02/2015, Disp: , Rfl:  .  cyanocobalamin 1000 MCG tablet, Take 500 mcg by mouth daily. , Disp: , Rfl:  .  denosumab (PROLIA) 60 MG/ML SOSY injection, Prolia, Disp: , Rfl:  .  ethambutol (MYAMBUTOL) 400 MG tablet, Take 2 tablets (800 mg total) by mouth daily., Disp: 60 tablet, Rfl: 11 .  fexofenadine (ALLEGRA) 180 MG tablet, Take 180 mg by mouth daily as needed for allergies. Reported on 04/14/2015, Disp: , Rfl:  .  guaiFENesin (MUCINEX) 600 MG 12 hr tablet, Take 600 mg by mouth 2 (two) times daily. Reported on 08/02/2015, Disp: , Rfl:  .  lansoprazole (PREVACID SOLUTAB) 30 MG disintegrating tablet, Take 1 tablet (30 mg total) by mouth 2 (two) times daily before a meal., Disp: 180 tablet, Rfl: 3 .  promethazine (PHENERGAN) 25 MG tablet, Take 25 mg by mouth every 6 (six) hours as needed for nausea or vomiting., Disp: , Rfl:  .  propranolol (INDERAL) 10 MG tablet, Take 10 mg by mouth daily., Disp: , Rfl:  .  Respiratory Therapy Supplies (FLUTTER) DEVI, Use as directed, Disp: 1 each, Rfl: 0 .  rifampin (RIFADIN) 300 MG capsule, Take 2 capsules (600 mg total) by mouth daily., Disp: 60 capsule, Rfl: 11 .  sucralfate (CARAFATE) 1 g tablet, Take 1 tablet (1 g total) by mouth 2 (two) times daily., Disp: 60 tablet, Rfl: 11 .  SUMAtriptan (IMITREX) 100 MG tablet, Take 100 mg by mouth as needed. Reported on 04/14/2015, Disp: , Rfl:  .  topiramate (TOPAMAX) 200 MG tablet, Take 200 mg by mouth  daily. Reported on 04/14/2015, Disp: , Rfl:   Current Facility-Administered Medications:  .  0.9 %  sodium chloride infusion, 500 mL, Intravenous,  Continuous, Nandigam, Venia Minks, MD       Review of Systems  Constitutional: Negative for activity change, appetite change, chills, diaphoresis, fatigue, fever and unexpected weight change.  HENT: Positive for trouble swallowing. Negative for congestion, rhinorrhea, sinus pressure, sneezing and sore throat.   Eyes: Negative for photophobia and visual disturbance.  Respiratory: Positive for cough. Negative for chest tightness, shortness of breath, wheezing and stridor.   Cardiovascular: Negative for chest pain, palpitations and leg swelling.  Gastrointestinal: Negative for abdominal distention, abdominal pain, anal bleeding, blood in stool, constipation, diarrhea, nausea and vomiting.  Genitourinary: Negative for difficulty urinating, dysuria, flank pain and hematuria.  Musculoskeletal: Negative for arthralgias, back pain, gait problem, joint swelling and myalgias.  Skin: Negative for color change, pallor, rash and wound.  Neurological: Negative for dizziness, tremors, weakness and light-headedness.  Hematological: Negative for adenopathy. Does not bruise/bleed easily.  Psychiatric/Behavioral: Negative for agitation, behavioral problems, confusion, decreased concentration, dysphoric mood and sleep disturbance.       Objective:   Physical Exam Constitutional:      General: She is not in acute distress.    Appearance: Normal appearance. She is well-developed. She is not ill-appearing or diaphoretic.  HENT:     Head: Normocephalic and atraumatic.     Right Ear: Hearing and external ear normal.     Left Ear: Hearing and external ear normal.     Nose: No nasal deformity or rhinorrhea.  Eyes:     General: No scleral icterus.    Conjunctiva/sclera: Conjunctivae normal.     Right eye: Right conjunctiva is not injected.     Left eye: Left  conjunctiva is not injected.     Pupils: Pupils are equal, round, and reactive to light.  Neck:     Musculoskeletal: Normal range of motion and neck supple.     Vascular: No JVD.  Cardiovascular:     Rate and Rhythm: Normal rate and regular rhythm.     Heart sounds: Normal heart sounds, S1 normal and S2 normal. No murmur. No friction rub.  Pulmonary:     Effort: Pulmonary effort is normal. No respiratory distress.     Breath sounds: Normal breath sounds. No stridor. No wheezing or rales.  Abdominal:     General: Bowel sounds are normal. There is no distension.     Palpations: Abdomen is soft.     Tenderness: There is no abdominal tenderness.  Musculoskeletal: Normal range of motion.     Right shoulder: Normal.     Left shoulder: Normal.     Right hip: Normal.     Left hip: Normal.     Right knee: Normal.     Left knee: Normal.  Lymphadenopathy:     Head:     Right side of head: No submandibular, preauricular or posterior auricular adenopathy.     Left side of head: No submandibular, preauricular or posterior auricular adenopathy.     Cervical: No cervical adenopathy.     Right cervical: No superficial or deep cervical adenopathy.    Left cervical: No superficial or deep cervical adenopathy.  Skin:    General: Skin is warm and dry.     Coloration: Skin is not pale.     Findings: No abrasion, bruising, ecchymosis, erythema, lesion or rash.     Nails: There is no clubbing.   Neurological:     General: No focal deficit present.     Mental Status: She is alert and oriented to person, place, and  time.     Sensory: No sensory deficit.     Coordination: Coordination normal.     Gait: Gait normal.  Psychiatric:        Attention and Perception: She is attentive.        Mood and Affect: Mood normal.        Speech: Speech normal.        Behavior: Behavior normal. Behavior is cooperative.        Thought Content: Thought content normal.        Judgment: Judgment normal.            Assessment & Plan:   Mycobacterium avium continue current medications return to clinic in 1 year.  Esophageal stricture to have dilatation done by GI.  Need for flu vaccine have given her high-dose flu vaccine.  She did apparently have a flulike illness after a high-dose vaccine several years ago but I feel it is worth the risk given the severity of influenza and the risk of influenza and COVID dual infection or sequential infection this flu season.

## 2019-02-13 IMAGING — RF DG ESOPHAGUS
10 series · 14 of 24 positions shown · non-contrast
Comparison: None.

CLINICAL DATA: Esophageal dysphagia.

EXAM:
ESOPHOGRAM / BARIUM SWALLOW / BARIUM TABLET STUDY
TECHNIQUE: Combined double contrast and single contrast examination performed
using effervescent crystals, thick barium liquid, and thin barium
liquid. The patient was observed with fluoroscopy swallowing a 13 mm
barium sulphate tablet.
FLUOROSCOPY TIME:  Fluoroscopy Time:  2 minutes and 12 seconds
Radiation Exposure Index (if provided by the fluoroscopic device):
31 mGy
Number of Acquired Spot Images: 0

[Series 1: sequence · 0.31mm/px · 2 of 15 frames shown (1 of 8)]
[frame 3/15]
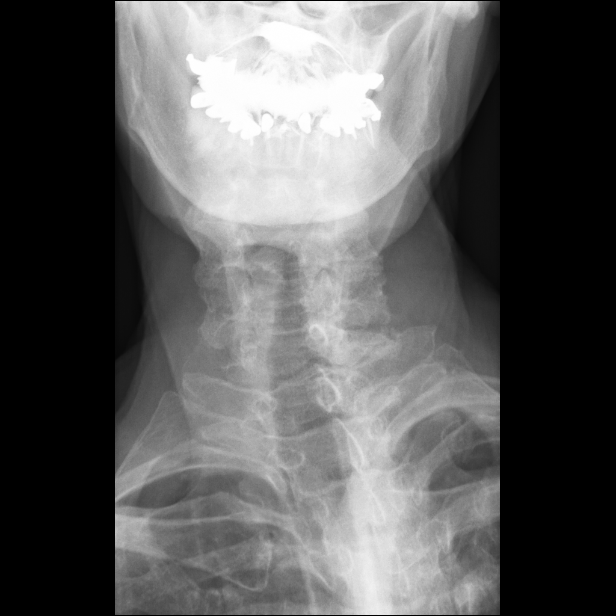
[frame 13/15]
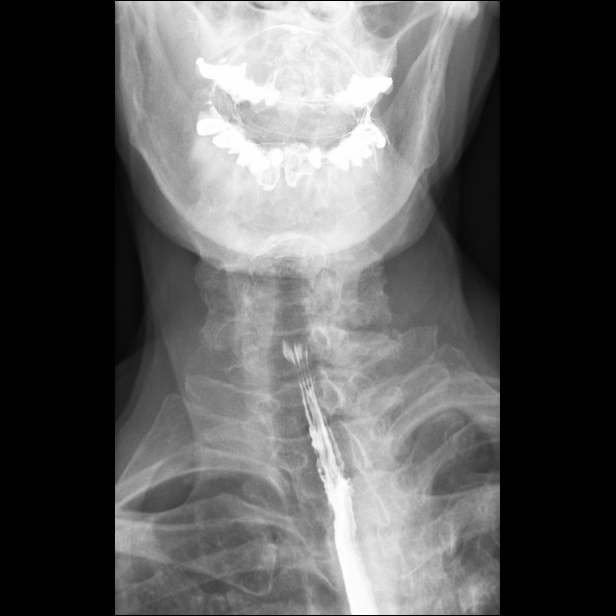

[Series 2: sequence · 0.31mm/px · 1 of 15 frames shown (2 of 8)]
[frame 15/15]
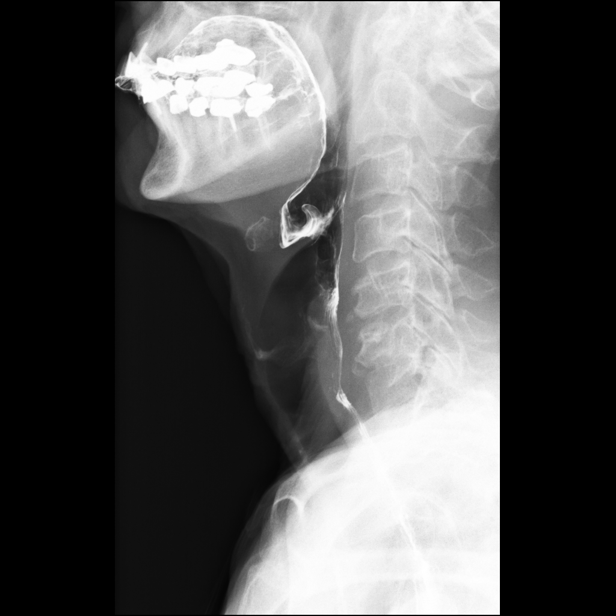

[Series 3: sequence · 0.28mm/px · 1 of 17 frames shown (3 of 8)]
[frame 9/17]
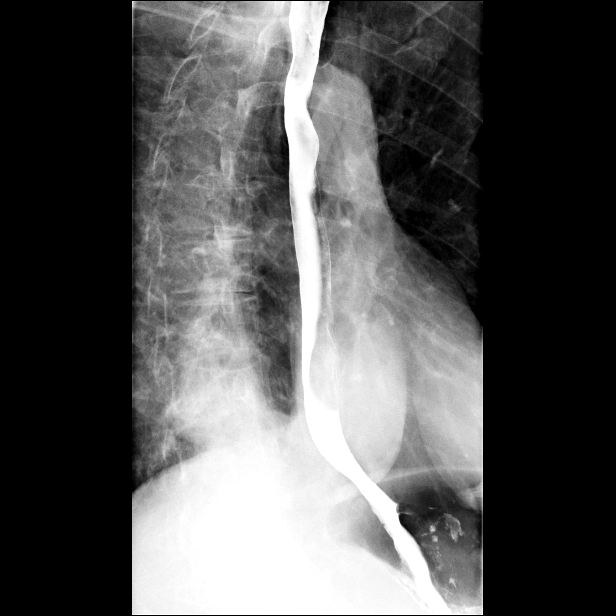

[Series 4: one shot · 1 of 1 slices shown (1 of 2)]
[im 1/1]
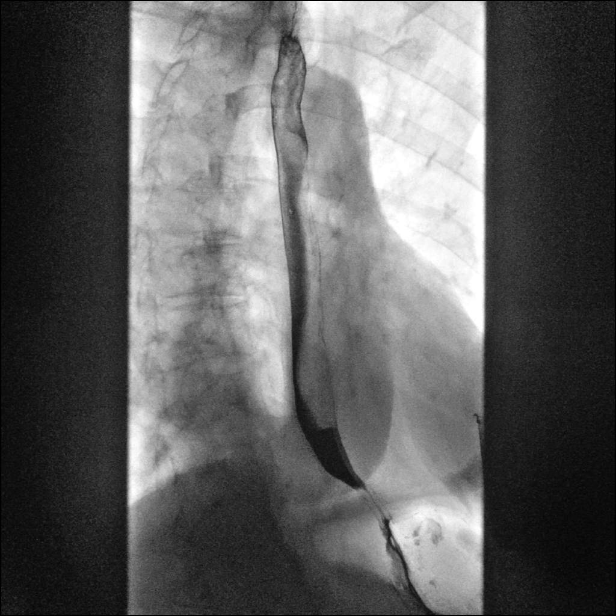

[Series 5: sequence · 1 of 11 frames shown (4 of 8)]
[frame 10/11]
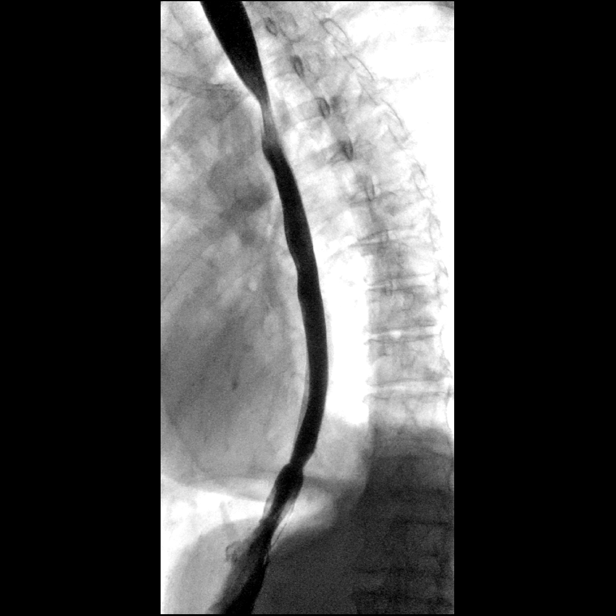

[Series 6: sequence · 2 of 12 frames shown (5 of 8)]
[frame 5/12]
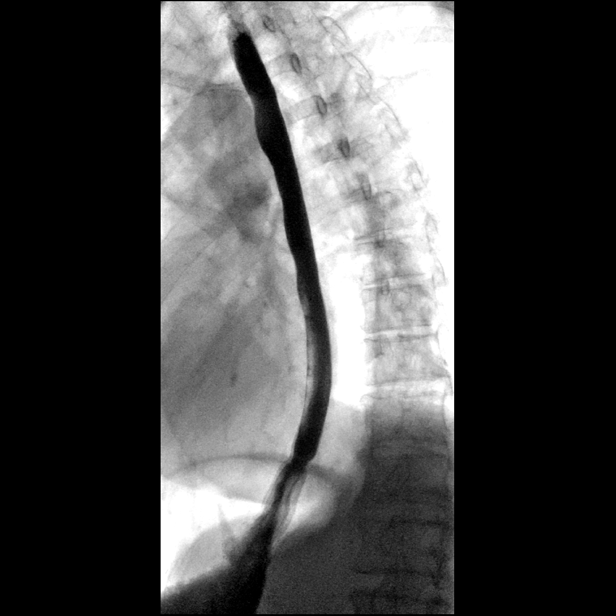
[frame 11/12]
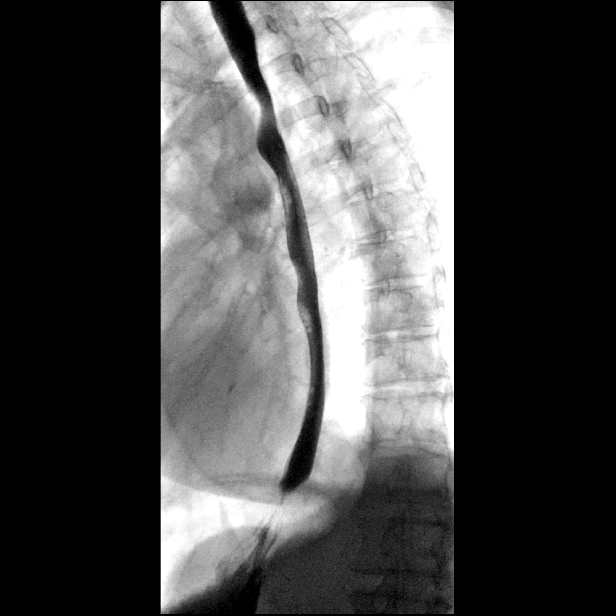

[Series 7: sequence · 1 of 10 frames shown (6 of 8)]
[frame 9/10]
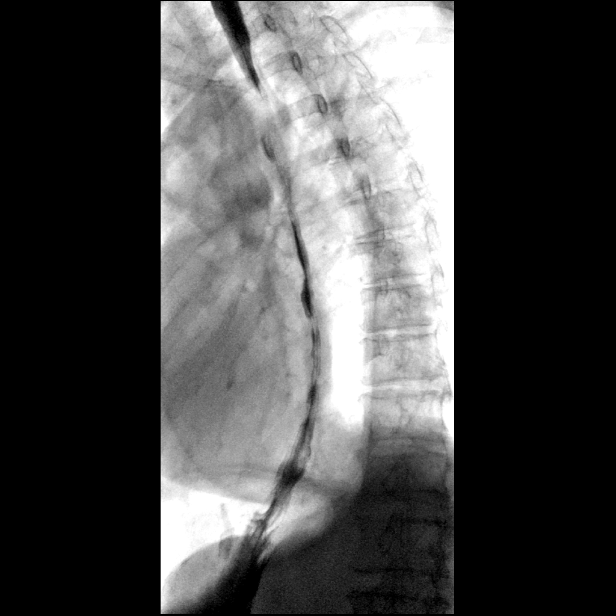

[Series 8: sequence · 1 of 8 frames shown (7 of 8)]
[frame 2/8]
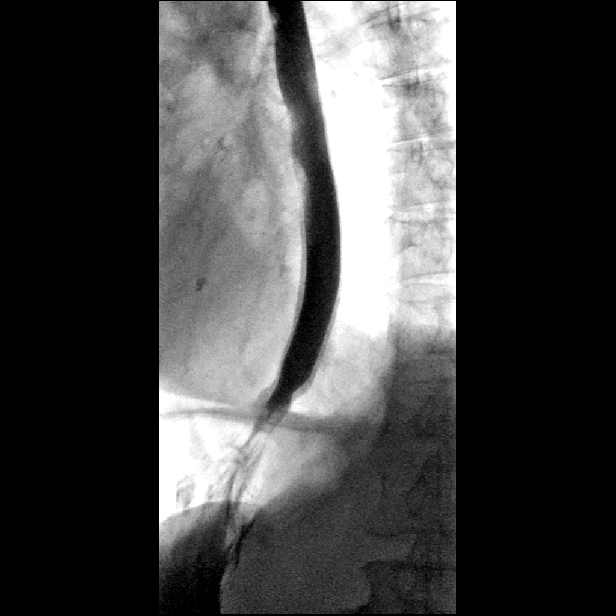

[Series 10: sequence · 2 of 13 frames shown (8 of 8)]
[frame 2/13]
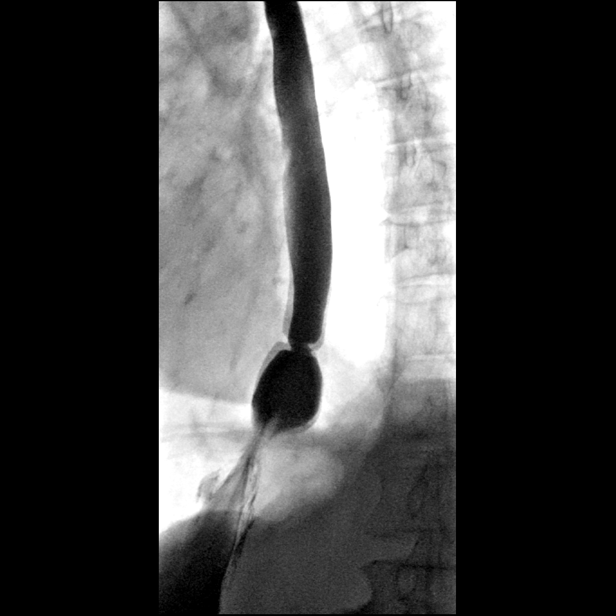
[frame 7/13]
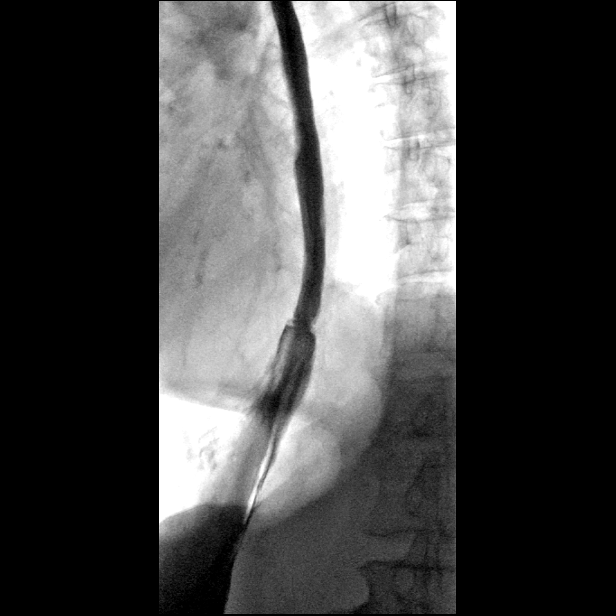

[Series 11: one shot · 2 of 5 slices shown (2 of 2)]
[im 2/5]
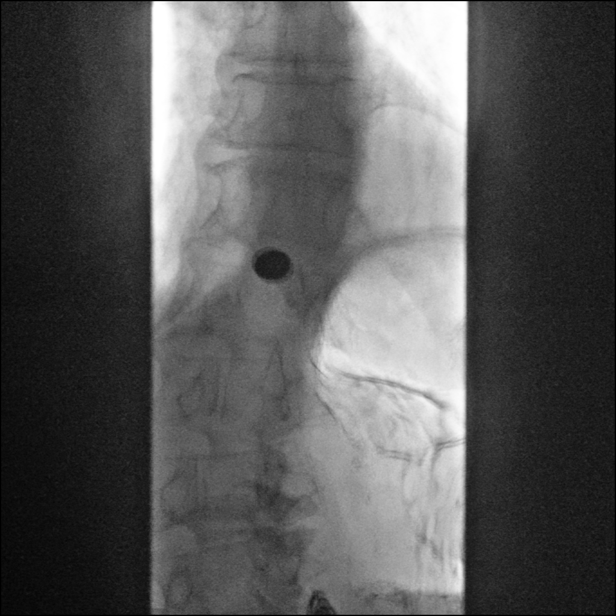
[im 5/5]
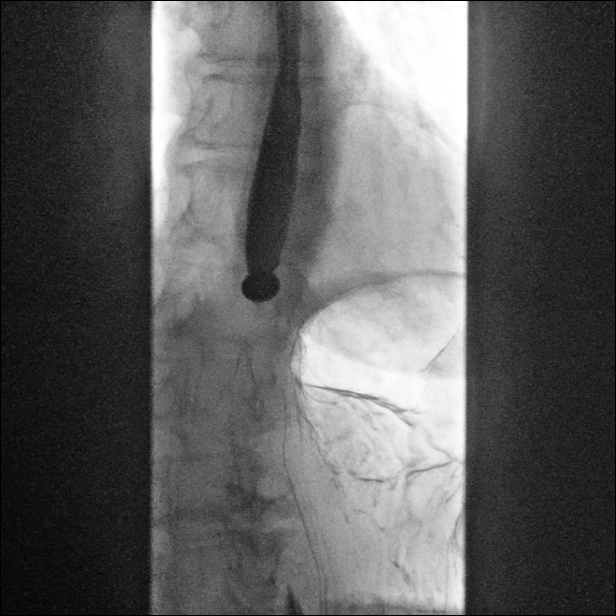

[14 of 24 positions shown; findings below may reference images not displayed]

FINDINGS: Initial barium swallows demonstrate normal pharyngeal motion with
swallowing. No laryngeal penetration or aspiration. No upper
esophageal webs, strictures or diverticuli. Moderate degenerative
changes involving the cervical spine with mild anterior spurring
slightly impressing on the posterior aspect of the cervical
esophagus.

Normal esophageal motility. No mass lesions. There is a small
sliding-type hiatal hernia and a lower esophageal mucosal ring. The
13 mm barium pill would not pass through this area.

No GE reflux was demonstrated.
IMPRESSION: 1. Small sliding-type hiatal hernia with a lower esophageal mucosal
B ring. The 13 mm barium pill would not pass through this area.
2. Normal esophageal motility.
3. No GE reflux.

## 2019-03-05 ENCOUNTER — Ambulatory Visit: Payer: Medicare Other | Attending: Internal Medicine

## 2019-03-05 DIAGNOSIS — Z23 Encounter for immunization: Secondary | ICD-10-CM | POA: Insufficient documentation

## 2019-03-05 NOTE — Progress Notes (Signed)
   Covid-19 Vaccination Clinic  Name:  [name]    MRN: [id number] DOB: March 15, 1940  03/05/2019  [name] was observed post Covid-19 immunization for 15 minutes without incidence. She was provided with Vaccine Information Sheet and instruction to access the V-Safe system.   [name] was instructed to call 911 with any severe reactions post vaccine: Marland Kitchen Difficulty breathing  . Swelling of your face and throat  . A fast heartbeat  . A bad rash all over your body  . Dizziness and weakness    Immunizations Administered    Name Date Dose VIS Date Route   Pfizer COVID-19 Vaccine 03/05/2019 11:19 AM 0.3 mL 01/29/2019 Intramuscular   Manufacturer: [employer]   Lot: [id number]   [place]: [id number]

## 2019-03-26 ENCOUNTER — Ambulatory Visit: Payer: Medicare Other | Attending: Internal Medicine

## 2019-03-26 DIAGNOSIS — Z23 Encounter for immunization: Secondary | ICD-10-CM | POA: Insufficient documentation

## 2019-03-26 NOTE — Progress Notes (Signed)
   Covid-19 Vaccination Clinic  Name:  LARKEN SMOLEY    MRN: [id number] DOB: 07/24/1940  03/26/2019  Ms. Ridgeway was observed post Covid-19 immunization for 15 minutes without incidence. She was provided with Vaccine Information Sheet and instruction to access the V-Safe system.   Ms. Osler was instructed to call 911 with any severe reactions post vaccine: Marland Kitchen Difficulty breathing  . Swelling of your face and throat  . A fast heartbeat  . A bad rash all over your body  . Dizziness and weakness    Immunizations Administered    Name Date Dose VIS Date Route   Pfizer COVID-19 Vaccine 03/26/2019 11:29 AM 0.3 mL 01/29/2019 Intramuscular   Manufacturer: [place]   Lot: [id number]   [place]: [id number]

## 2019-04-16 ENCOUNTER — Telehealth: Payer: Self-pay | Admitting: Gastroenterology

## 2019-04-16 MED ORDER — LANSOPRAZOLE 30 MG PO TBDD: 30.0000 mg | DELAYED_RELEASE_TABLET | Freq: Two times a day (BID) | ORAL | 3 refills | Status: DC

## 2019-04-16 NOTE — Telephone Encounter (Signed)
Prior authorization initiated through Cover My Meds.  Awaiting response.

## 2019-04-16 NOTE — Telephone Encounter (Signed)
Pt states she has changed insurances to [facility] and they cover the medication Lansoprazole however they only cover 30 pills (she takes 2xs daily and needs 60 pill per month)  so she needs the doctor to submit an acceptation to quantity limit rule to [place]: [place] Phone number [phone] or Fax [phone].

## 2019-04-16 NOTE — Telephone Encounter (Signed)
Twice a day Lansoprazole approved by [facility].  Resent prescription to pharmacy

## 2019-04-22 ENCOUNTER — Encounter (HOSPITAL_COMMUNITY): Payer: Self-pay

## 2019-04-22 ENCOUNTER — Other Ambulatory Visit: Payer: Self-pay

## 2019-04-22 ENCOUNTER — Ambulatory Visit (HOSPITAL_COMMUNITY)
Admission: RE | Admit: 2019-04-22 | Discharge: 2019-04-22 | Disposition: A | Discharge status: Home / Self Care | Payer: Medicare PPO | Source: Ambulatory Visit | Attending: Internal Medicine | Admitting: Internal Medicine

## 2019-04-22 DIAGNOSIS — M810 Age-related osteoporosis without current pathological fracture: Secondary | ICD-10-CM | POA: Diagnosis not present

## 2019-04-22 MED ORDER — DENOSUMAB 60 MG/ML ~~LOC~~ SOSY
60.0000 mg | PREFILLED_SYRINGE | Freq: Once | SUBCUTANEOUS | Status: AC
  Administered 2019-04-22: 60 mg via SUBCUTANEOUS
  Filled 2019-04-22: qty 1

## 2019-04-22 NOTE — Discharge Instructions (Signed)
Denosumab injection What is this medicine? DENOSUMAB (den oh sue mab) slows bone breakdown. Prolia is used to treat osteoporosis in women after menopause and in men, and in people who are taking corticosteroids for 6 months or more. Xgeva is used to treat a high calcium level due to cancer and to prevent bone fractures and other bone problems caused by multiple myeloma or cancer bone metastases. Xgeva is also used to treat giant cell tumor of the bone. This medicine may be used for other purposes; ask your health care provider or pharmacist if you have questions. COMMON BRAND NAME(S): Prolia, XGEVA What should I tell my health care provider before I take this medicine? They need to know if you have any of these conditions:  dental disease  having surgery or tooth extraction  infection  kidney disease  low levels of calcium or Vitamin D in the blood  malnutrition  on hemodialysis  skin conditions or sensitivity  thyroid or parathyroid disease  an unusual reaction to denosumab, other medicines, foods, dyes, or preservatives  pregnant or trying to get pregnant  breast-feeding How should I use this medicine? This medicine is for injection under the skin. It is given by a health care professional in a hospital or clinic setting. A special MedGuide will be given to you before each treatment. Be sure to read this information carefully each time. For Prolia, talk to your pediatrician regarding the use of this medicine in children. Special care may be needed. For Xgeva, talk to your pediatrician regarding the use of this medicine in children. While this drug may be prescribed for children as young as 13 years for selected conditions, precautions do apply. Overdosage: If you think you have taken too much of this medicine contact a poison control center or emergency room at once. NOTE: This medicine is only for you. Do not share this medicine with others. What if I miss a dose? It is  important not to miss your dose. Call your doctor or health care professional if you are unable to keep an appointment. What may interact with this medicine? Do not take this medicine with any of the following medications:  other medicines containing denosumab This medicine may also interact with the following medications:  medicines that lower your chance of fighting infection  steroid medicines like prednisone or cortisone This list may not describe all possible interactions. Give your health care provider a list of all the medicines, herbs, non-prescription drugs, or dietary supplements you use. Also tell them if you smoke, drink alcohol, or use illegal drugs. Some items may interact with your medicine. What should I watch for while using this medicine? Visit your doctor or health care professional for regular checks on your progress. Your doctor or health care professional may order blood tests and other tests to see how you are doing. Call your doctor or health care professional for advice if you get a fever, chills or sore throat, or other symptoms of a cold or flu. Do not treat yourself. This drug may decrease your body's ability to fight infection. Try to avoid being around people who are sick. You should make sure you get enough calcium and vitamin D while you are taking this medicine, unless your doctor tells you not to. Discuss the foods you eat and the vitamins you take with your health care professional. See your dentist regularly. Brush and floss your teeth as directed. Before you have any dental work done, tell your dentist you are   receiving this medicine. Do not become pregnant while taking this medicine or for 5 months after stopping it. Talk with your doctor or health care professional about your birth control options while taking this medicine. Women should inform their doctor if they wish to become pregnant or think they might be pregnant. There is a potential for serious side  effects to an unborn child. Talk to your health care professional or pharmacist for more information. What side effects may I notice from receiving this medicine? Side effects that you should report to your doctor or health care professional as soon as possible:  allergic reactions like skin rash, itching or hives, swelling of the face, lips, or tongue  bone pain  breathing problems  dizziness  jaw pain, especially after dental work  redness, blistering, peeling of the skin  signs and symptoms of infection like fever or chills; cough; sore throat; pain or trouble passing urine  signs of low calcium like fast heartbeat, muscle cramps or muscle pain; pain, tingling, numbness in the hands or feet; seizures  unusual bleeding or bruising  unusually weak or tired Side effects that usually do not require medical attention (report to your doctor or health care professional if they continue or are bothersome):  constipation  diarrhea  headache  joint pain  loss of appetite  muscle pain  runny nose  tiredness  upset stomach This list may not describe all possible side effects. Call your doctor for medical advice about side effects. You may report side effects to FDA at 1-800-FDA-1088. Where should I keep my medicine? This medicine is only given in a clinic, doctor's office, or other health care setting and will not be stored at home. NOTE: This sheet is a summary. It may not cover all possible information. If you have questions about this medicine, talk to your doctor, pharmacist, or health care provider.  2020 Elsevier/Gold Standard (2017-06-13 16:10:44)

## 2019-11-09 ENCOUNTER — Ambulatory Visit: Payer: Medicare PPO | Admitting: Infectious Disease

## 2019-11-09 ENCOUNTER — Other Ambulatory Visit: Payer: Self-pay

## 2019-11-09 ENCOUNTER — Encounter: Payer: Self-pay | Admitting: Infectious Disease

## 2019-11-09 VITALS — BP 131/84 | HR 87 | Temp 97.8°F | Wt 120.2 lb

## 2019-11-09 DIAGNOSIS — J479 Bronchiectasis, uncomplicated: Secondary | ICD-10-CM

## 2019-11-09 DIAGNOSIS — J449 Chronic obstructive pulmonary disease, unspecified: Secondary | ICD-10-CM

## 2019-11-09 DIAGNOSIS — Z7185 Encounter for immunization safety counseling: Secondary | ICD-10-CM

## 2019-11-09 DIAGNOSIS — A310 Pulmonary mycobacterial infection: Secondary | ICD-10-CM | POA: Diagnosis not present

## 2019-11-09 DIAGNOSIS — Z7189 Other specified counseling: Secondary | ICD-10-CM

## 2019-11-09 DIAGNOSIS — K222 Esophageal obstruction: Secondary | ICD-10-CM | POA: Diagnosis not present

## 2019-11-09 DIAGNOSIS — F419 Anxiety disorder, unspecified: Secondary | ICD-10-CM

## 2019-11-09 HISTORY — DX: Encounter for immunization safety counseling: Z71.85

## 2019-11-09 HISTORY — DX: Esophageal obstruction: K22.2

## 2019-11-09 LAB — COMPLETE METABOLIC PANEL WITH GFR
AG Ratio: 2 (calc) (ref 1.0–2.5)
ALT: 11 U/L (ref 6–29)
AST: 15 U/L (ref 10–35)
Albumin: 4.3 g/dL (ref 3.6–5.1)
Alkaline phosphatase (APISO): 47 U/L (ref 37–153)
BUN: 9 mg/dL (ref 7–25)
CO2: 25 mmol/L (ref 20–32)
Calcium: 9.9 mg/dL (ref 8.6–10.4)
Chloride: 107 mmol/L (ref 98–110)
Creat: 0.75 mg/dL (ref 0.60–0.93)
GFR, Est African American: 88 mL/min/{1.73_m2} (ref >=60)
GFR, Est Non African American: 76 mL/min/{1.73_m2} (ref >=60)
Globulin: 2.2 g/dL (calc) (ref 1.9–3.7)
Glucose, Bld: 106 mg/dL — ABNORMAL HIGH (ref 65–99)
Potassium: 5.4 mmol/L — ABNORMAL HIGH (ref 3.5–5.3)
Sodium: 142 mmol/L (ref 135–146)
Total Bilirubin: 0.6 mg/dL (ref 0.2–1.2)
Total Protein: 6.5 g/dL (ref 6.1–8.1)

## 2019-11-09 LAB — CBC WITH DIFFERENTIAL/PLATELET
Absolute Monocytes: 348 cells/uL (ref 200–950)
Basophils Absolute: 49 cells/uL (ref 0–200)
Basophils Relative: 1 %
Eosinophils Absolute: 137 cells/uL (ref 15–500)
Eosinophils Relative: 2.8 %
HCT: 42.5 % (ref 35.0–45.0)
Hemoglobin: 14.1 g/dL (ref 11.7–15.5)
Lymphs Abs: 637 cells/uL — ABNORMAL LOW (ref 850–3900)
MCH: 32.2 pg (ref 27.0–33.0)
MCHC: 33.2 g/dL (ref 32.0–36.0)
MCV: 97 fL (ref 80.0–100.0)
MPV: 11.4 fL (ref 7.5–12.5)
Monocytes Relative: 7.1 %
Neutro Abs: 3729 cells/uL (ref 1500–7800)
Neutrophils Relative %: 76.1 %
Platelets: 183 10*3/uL (ref 140–400)
RBC: 4.38 10*6/uL (ref 3.80–5.10)
RDW: 12.5 % (ref 11.0–15.0)
Total Lymphocyte: 13 %
WBC: 4.9 10*3/uL (ref 3.8–10.8)

## 2019-11-09 NOTE — Progress Notes (Signed)
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   MRN: [id number] Chief complaint: Concern about getting vaccines around the time of her Prolia injection which is this Thursday HPI  27  old with nodular, bronchiectatic M avium infection who had been tolerating 3 drug rx for M avium.   She continues to tolerate her medications.  She does have trouble with the azithromycin due to the esophageal stricture.  She has to cut the tablet in half to swallow it and she has discomfort even when it does get through the esophagus with a burning sensation in her stomach.  She needs to get back in and see GI to have her esophageal stricture dilated but this has not been done yet.  She is going to have a Prolia injection for osteoporosis on Thursday.   She has had 2 COVID-19 vaccines in January and February but I think clearly should receive a third 1 and also needs influenza vaccine.  She was concerned that she should not have vaccines in close proximity to the Prolia injection.  Cassie infectious pharmacist look this up and it apparently is only concern for increased injection site reactions with Pfizer Covid 19 vaccine and Prolia being given between 4 to 7 days of one another.    Past Medical History:  Diagnosis Date  . Allergy    SEASONAL  . Anemia   . Breast cancer, right ([place])   . Bronchiectasis ([place])   . Bronchiectasis ([place]) 11/30/2014  . Cataract    bil removed - with lense implant  . Dilation of biliary tract   . Diverticulosis   . Dysphagia 11/18/2017  . Dyspnea    with exertion   . Fatigue 07/16/2017  . GERD (gastroesophageal reflux disease)   . History of radiation therapy   . 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 Low serum vitamin D 03/06/2015  . Lumbar disc herniation   . Malaise 11/18/2017  . Migraine   . Nausea 07/16/2017  . Osteopenia   . Osteoporosis    EARLY STAGES  . Ovarian cyst   . Plantar  fasciitis   . Pneumonia   . 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 Vitamin B12 deficiency   . Weight loss 11/30/2014    Past Surgical History:  Procedure Laterality Date  . ABDOMINAL HYSTERECTOMY    . BREAST LUMPECTOMY Right   . CATARACT EXTRACTION Right   . COLONOSCOPY    . LAPAROSCOPIC CHOLECYSTECTOMY    . MASTECTOMY PARTIAL / LUMPECTOMY    . MASTOIDECTOMY    . MYRINGOTOMY     with tympanoplasty and mastoidectomy  . NOSE SURGERY    . TONSILLECTOMY    . TOTAL VAGINAL HYSTERECTOMY    . UPPER GASTROINTESTINAL ENDOSCOPY      Family History  Problem Relation Age of Onset  . Bone cancer Father   . Heart disease Father 35       CAD-CABG  . Leukemia Mother   . Parkinsonism Mother   . Heart attack Paternal Uncle   . Colon cancer Neg Hx   . Stomach cancer Neg Hx   . Esophageal cancer Neg Hx   . Rectal cancer Neg Hx       Social History   Socioeconomic History  . Marital status: Widowed    Spouse name: Not on file  . Number of children: 2  . Years  of education: Not on file  . Highest education level: Not on file  Occupational History  . Occupation: Retired  Tobacco Use  . Smoking status: Never Smoker  . Smokeless tobacco: Never Used  Vaping Use  . Vaping Use: Never used  Substance and Sexual Activity  . Alcohol use: No    Alcohol/week: 0.0 standard drinks  . Drug use: No  . Sexual activity: Not Currently  Other Topics Concern  . Not on file  Social History Narrative   Daily caffeine    Social Determinants of Health   Financial Resource Strain:   . Difficulty of Paying Living Expenses: Not on file  Food Insecurity:   . Worried About Charity fundraiser in the Last Year: Not on file  . Ran Out of Food in the Last Year: Not on file  Transportation Needs:   . Lack of Transportation (Medical): Not on file  . Lack of Transportation (Non-Medical): Not on file  Physical Activity:   . Days of Exercise per Week: Not on  file  . Minutes of Exercise per Session: Not on file  Stress:   . Feeling of Stress : Not on file  Social Connections:   . Frequency of Communication with Friends and Family: Not on file  . Frequency of Social Gatherings with Friends and Family: Not on file  . Attends Religious Services: Not on file  . Active Member of Clubs or Organizations: Not on file  . Attends Archivist Meetings: Not on file  . Marital Status: Not on file    Allergies  Allergen Reactions  . Codeine Nausea And Vomiting  . Droperidol     Nervous & shaking  . Meperidine Hcl Nausea And Vomiting  . Pb-Hyoscy-Atropine-Scopolamine   . Sulfasalazine Other (See Comments)    REACTION: nausea  . Sulfonamide Derivatives     REACTION: nausea  . Barbiturates Rash  . Phenobarbital Rash     Current Outpatient Medications:  .  azithromycin (ZITHROMAX) 500 MG tablet, Take 1 tablet (500 mg total) by mouth daily., Disp: 30 tablet, Rfl: 11 .  cholecalciferol (VITAMIN D) 1000 units tablet, Take 50,000 Units by mouth. Reported on 08/02/2015, Disp: , Rfl:  .  cyanocobalamin 1000 MCG tablet, Take 500 mcg by mouth daily. , Disp: , Rfl:  .  denosumab (PROLIA) 60 MG/ML SOSY injection, Prolia, Disp: , Rfl:  .  ethambutol (MYAMBUTOL) 400 MG tablet, Take 2 tablets (800 mg total) by mouth daily., Disp: 60 tablet, Rfl: 11 .  fexofenadine (ALLEGRA) 180 MG tablet, Take 180 mg by mouth daily as needed for allergies. Reported on 04/14/2015, Disp: , Rfl:  .  guaiFENesin (MUCINEX) 600 MG 12 hr tablet, Take 600 mg by mouth 2 (two) times daily. Reported on 08/02/2015, Disp: , Rfl:  .  lansoprazole (PREVACID SOLUTAB) 30 MG disintegrating tablet, Take 1 tablet (30 mg total) by mouth 2 (two) times daily before a meal., Disp: 180 tablet, Rfl: 3 .  promethazine (PHENERGAN) 25 MG tablet, Take 25 mg by mouth every 6 (six) hours as needed for nausea or vomiting., Disp: , Rfl:  .  propranolol (INDERAL) 10 MG tablet, Take 10 mg by mouth daily.,  Disp: , Rfl:  .  Respiratory Therapy Supplies (FLUTTER) DEVI, Use as directed, Disp: 1 each, Rfl: 0 .  rifampin (RIFADIN) 300 MG capsule, Take 2 capsules (600 mg total) by mouth daily., Disp: 60 capsule, Rfl: 11 .  sucralfate (CARAFATE) 1 g tablet, Take 1 tablet (1  g total) by mouth 2 (two) times daily., Disp: 60 tablet, Rfl: 11 .  SUMAtriptan (IMITREX) 100 MG tablet, Take 100 mg by mouth as needed. Reported on 04/14/2015, Disp: , Rfl:  .  topiramate (TOPAMAX) 200 MG tablet, Take 200 mg by mouth daily. Reported on 04/14/2015, Disp: , Rfl:   Current Facility-Administered Medications:  .  0.9 %  sodium chloride infusion, 500 mL, Intravenous, Continuous, Nandigam, Kavitha V, MD       Review of Systems  Constitutional: Negative for activity change, appetite change, chills, diaphoresis, fatigue, fever and unexpected weight change.  HENT: Positive for trouble swallowing. Negative for congestion, rhinorrhea, sinus pressure, sneezing and sore throat.   Eyes: Negative for photophobia and visual disturbance.  Respiratory: Positive for cough. Negative for chest tightness, shortness of breath, wheezing and stridor.   Cardiovascular: Negative for chest pain, palpitations and leg swelling.  Gastrointestinal: Negative for abdominal distention, abdominal pain, anal bleeding, blood in stool, constipation, diarrhea, nausea and vomiting.  Genitourinary: Negative for difficulty urinating, dysuria, flank pain and hematuria.  Musculoskeletal: Negative for arthralgias, back pain, gait problem, joint swelling and myalgias.  Skin: Negative for color change, pallor, rash and wound.  Neurological: Negative for dizziness, tremors, weakness and light-headedness.  Hematological: Negative for adenopathy. Does not bruise/bleed easily.  Psychiatric/Behavioral: Negative for agitation, behavioral problems, confusion, decreased concentration, dysphoric mood and sleep disturbance. The patient is nervous/anxious.         Objective:   Physical Exam Constitutional:      General: She is not in acute distress.    Appearance: Normal appearance. She is well-developed. She is not ill-appearing or diaphoretic.  HENT:     Head: Normocephalic and atraumatic.     Right Ear: Hearing and external ear normal.     Left Ear: Hearing and external ear normal.     Nose: No nasal deformity or rhinorrhea.  Eyes:     General: No scleral icterus.    Conjunctiva/sclera: Conjunctivae normal.     Right eye: Right conjunctiva is not injected.     Left eye: Left conjunctiva is not injected.     Pupils: Pupils are equal, round, and reactive to light.  Neck:     Vascular: No JVD.  Cardiovascular:     Rate and Rhythm: Normal rate and regular rhythm.     Heart sounds: Normal heart sounds, S1 normal and S2 normal. No murmur heard.  No friction rub.  Pulmonary:     Effort: Pulmonary effort is normal. No respiratory distress.     Breath sounds: Normal breath sounds. No stridor. No wheezing or rales.  Abdominal:     General: Bowel sounds are normal. There is no distension.     Palpations: Abdomen is soft.     Tenderness: There is no abdominal tenderness.  Musculoskeletal:        General: Normal range of motion.     Right shoulder: Normal.     Left shoulder: Normal.     Cervical back: Normal range of motion and neck supple.     Right hip: Normal.     Left hip: Normal.     Right knee: Normal.     Left knee: Normal.  Lymphadenopathy:     Head:     Right side of head: No submandibular, preauricular or posterior auricular adenopathy.     Left side of head: No submandibular, preauricular or posterior auricular adenopathy.     Cervical: No cervical adenopathy.     Right cervical:  No superficial or deep cervical adenopathy.    Left cervical: No superficial or deep cervical adenopathy.  Skin:    General: Skin is warm and dry.     Coloration: Skin is not pale.     Findings: No abrasion, bruising, ecchymosis, erythema, lesion or  rash.     Nails: There is no clubbing.  Neurological:     General: No focal deficit present.     Mental Status: She is alert and oriented to person, place, and time.     Sensory: No sensory deficit.     Coordination: Coordination normal.     Gait: Gait normal.  Psychiatric:        Attention and Perception: She is attentive.        Mood and Affect: Mood normal.        Speech: Speech normal.        Behavior: Behavior normal. Behavior is cooperative.        Thought Content: Thought content normal.        Judgment: Judgment normal.           Assessment & Plan:   Mycobacterium avium continue current medications return to clinic in 1 year.  Esophageal stricture needs to have a dilatation done by GI.  Vaccination: She will come back for COVID-19 vaccine and flu vaccine in the next week or 2 after her Prolia injection should feel more comfortable doing it that way.  Anxiety she has had increased anxiety in the context of the pandemic and has been involved in bringing her grandchildren to school.  On the whole though I think she is doing fairly well under the circumstances.

## 2019-11-10 ENCOUNTER — Other Ambulatory Visit (HOSPITAL_COMMUNITY): Payer: Self-pay | Admitting: *Deleted

## 2019-11-11 ENCOUNTER — Ambulatory Visit (HOSPITAL_COMMUNITY)
Admission: RE | Admit: 2019-11-11 | Discharge: 2019-11-11 | Disposition: A | Discharge status: Home / Self Care | Payer: Medicare PPO | Source: Ambulatory Visit | Attending: Internal Medicine | Admitting: Internal Medicine

## 2019-11-11 ENCOUNTER — Other Ambulatory Visit: Payer: Self-pay

## 2019-11-11 ENCOUNTER — Ambulatory Visit (HOSPITAL_COMMUNITY): Payer: Medicare Other

## 2019-11-11 DIAGNOSIS — M810 Age-related osteoporosis without current pathological fracture: Secondary | ICD-10-CM | POA: Diagnosis not present

## 2019-11-11 MED ORDER — DENOSUMAB 60 MG/ML ~~LOC~~ SOSY
60.0000 mg | PREFILLED_SYRINGE | Freq: Once | SUBCUTANEOUS | Status: AC
  Administered 2019-11-11: 60 mg via SUBCUTANEOUS

## 2019-11-11 MED ORDER — DENOSUMAB 60 MG/ML ~~LOC~~ SOSY
PREFILLED_SYRINGE | SUBCUTANEOUS | Status: AC
  Filled 2019-11-11: qty 1

## 2019-11-16 ENCOUNTER — Ambulatory Visit: Payer: Medicare Other | Admitting: Infectious Disease

## 2019-11-26 ENCOUNTER — Other Ambulatory Visit: Payer: Self-pay

## 2019-11-26 ENCOUNTER — Ambulatory Visit (INDEPENDENT_AMBULATORY_CARE_PROVIDER_SITE_OTHER): Payer: Medicare PPO

## 2019-11-26 DIAGNOSIS — Z23 Encounter for immunization: Secondary | ICD-10-CM

## 2019-11-26 NOTE — Progress Notes (Signed)
   Covid-19 Vaccination Clinic  Name:  [name]    MRN: [id number] DOB: 1941-01-17  11/26/2019  [name] was observed post Covid-19 immunization for 15 minutes without incident. She was provided with Vaccine Information Sheet and instruction to access the V-Safe system.   [name] was instructed to call 911 with any severe reactions post vaccine: Marland Kitchen Difficulty breathing  . Swelling of face and throat  . A fast heartbeat  . A bad rash all over body  . Dizziness and weakness     Beryle Flock, RN

## 2019-12-01 ENCOUNTER — Other Ambulatory Visit: Payer: Self-pay | Admitting: Gastroenterology

## 2019-12-01 DIAGNOSIS — K2961 Other gastritis with bleeding: Secondary | ICD-10-CM

## 2019-12-10 ENCOUNTER — Other Ambulatory Visit: Payer: Self-pay

## 2019-12-10 ENCOUNTER — Ambulatory Visit: Payer: Medicare PPO

## 2019-12-12 ENCOUNTER — Other Ambulatory Visit: Payer: Self-pay | Admitting: Infectious Disease

## 2019-12-17 ENCOUNTER — Other Ambulatory Visit: Payer: Self-pay | Admitting: Infectious Disease

## 2020-01-17 ENCOUNTER — Ambulatory Visit: Payer: Medicare PPO | Admitting: Pulmonary Disease

## 2020-01-17 ENCOUNTER — Encounter: Payer: Self-pay | Admitting: Pulmonary Disease

## 2020-01-17 ENCOUNTER — Other Ambulatory Visit: Payer: Self-pay

## 2020-01-17 VITALS — BP 118/70 | HR 95 | Temp 97.3°F | Ht 66.5 in | Wt 119.8 lb

## 2020-01-17 DIAGNOSIS — Z23 Encounter for immunization: Secondary | ICD-10-CM | POA: Diagnosis not present

## 2020-01-17 DIAGNOSIS — J479 Bronchiectasis, uncomplicated: Secondary | ICD-10-CM | POA: Diagnosis not present

## 2020-01-17 NOTE — Patient Instructions (Signed)
Will schedule pulmonary function test and high resolution CT chest  High dose flu shot today  Follow up in 6 months

## 2020-01-17 NOTE — Progress Notes (Signed)
[facility]  Chief Complaint  Patient presents with  . Follow-up    shortness of breath with activity    Constitutional:  BP 118/70 (BP Location: Left Arm, Cuff Size: Normal)   Pulse 95   Temp (!) 97.3 F (36.3 C) (Other (Comment)) Comment (Src): wrist  Ht 5' 6.5" (1.689 m)   Wt 119 lb 12.8 oz (54.3 kg)   SpO2 99% Comment: Room air  BMI 19.05 kg/m   Past Medical History:  Rt breast cancer, Cataract, Diverticulosis, Esophageal stricture, GERD, Tremor, Vit D deficiency, Migraine HA, Osteoporosis, Plantar fasciitis, Vit B12 deficiency  Past Surgical History:  Her  has a past surgical history that includes Total vaginal hysterectomy; Myringotomy; Laparoscopic cholecystectomy; Cataract extraction (Right); Breast lumpectomy (Right); Tonsillectomy; Mastectomy partial / lumpectomy; Abdominal hysterectomy; Upper gastrointestinal endoscopy; Nose surgery; Mastoidectomy; and Colonoscopy.  Brief Summary:  [name] is a 79 y.o. 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      Subjective:   She has noticed more trouble with shortness of breath.  This has been progressive.  Gets more congestion at night and gets a cough.  Not having fever or hemoptysis.  Denies chest pain or sweats.  Has more trouble with her swallowing, especially certain pills.  Physical Exam:   Appearance - well kempt   ENMT - no sinus tenderness, no oral exudate, no LAN, Mallampati 2 airway, no stridor  Respiratory - equal breath sounds bilaterally, no wheezing or rales  CV - s1s2 regular rate and rhythm, no murmurs  Ext - no clubbing, no edema  Skin - no rashes  Psych - normal mood and affect   Pulmonary testing:   PFT 12/27/04 >> FEV1 2.34 (100%), FEV1% 56, TLC 6.04 (115%), DLCO 87%  Bronchoscopy 02/04/07 >>Aspergillus species   PFT 05/21/12 >> FEV1 2.10 (98%), FEV1% 56%, TLC 4.83 (93%), DLCO 77%  Sputum 09/15/14 >> MAI  Chest Imaging:    08/29/06 >>6 mm nodule lingula, 2 mm nodule LLL, BTX in lingula and RML,   01/17/07 >> no change  02/16/10 >> BTX RML/lingula/RLL/LLL  06/01/12 >> progression of tree in bud pattern  09/06/14 >> BTX, increased nodularity, 2 cm nodule RLL, 1.3 cm nodule LLL  08/09/15 >> decreased to resolved nodules, no change in BTX  CT angio chest 06/27/17 >> BTX most on left, scattered nodularity  Sleep Tests:    Cardiac Tests:   Echo 08/18/17 >> EF 45 to 50%, grade 1 DD, mild MR  Social History:  She  reports that she has never smoked. She has never used smokeless tobacco. She reports that she does not drink alcohol and does not use drugs.  Family History:  Her family history includes Bone cancer in her father; Heart attack in her paternal uncle; Heart disease (age of onset: 94) in her father; Leukemia in her mother; Parkinsonism in her mother.      Assessment/Plan:   Bronchiectasis in setting of MAI. - she is followed by Dr. Tommy Medal with [facility] for therapy - she has progressive dyspnea; will arrange for high resolution CT chest and pulmonary function test - advised her to try using mucinex twice per day  Dyspnea on exertion. - she is unable to walk more than 200 feet without needing to rest - completed her handicap parking form  Esophageal stricture. - she will call to schedule follow up with Dr. Silverio Decamp with [facility]  Time Spent Involved in Patient Care on Day  of Examination:  32 minutes  Follow up:  Patient Instructions  Will schedule pulmonary function test and high resolution CT chest  High dose flu shot today  Follow up in 6 months   Medication List:   Allergies as of 01/17/2020      Reactions   Codeine Nausea And Vomiting   Droperidol    Nervous & shaking   Meperidine Hcl Nausea And Vomiting   Pb-hyoscy-atropine-scopolamine    Sulfasalazine Other (See Comments)   REACTION: nausea   Sulfonamide Derivatives    REACTION: nausea   Barbiturates Rash    Phenobarbital Rash      Medication List       Accurate as of January 17, 2020  3:29 PM. If you have any questions, ask your nurse or doctor.        azithromycin 500 MG tablet Commonly known as: ZITHROMAX TAKE 1 TABLET BY MOUTH EVERY DAY   cholecalciferol 1000 units tablet Commonly known as: VITAMIN D Take 50,000 Units by mouth. Reported on 08/02/2015   cyanocobalamin 1000 MCG tablet Take 500 mcg by mouth daily.   ethambutol 400 MG tablet Commonly known as: MYAMBUTOL TAKE 2 TABLETS (800 MG TOTAL) BY MOUTH DAILY.   fexofenadine 180 MG tablet Commonly known as: ALLEGRA Take 180 mg by mouth daily as needed for allergies. Reported on 04/14/2015   Flutter Devi Use as directed   guaiFENesin 600 MG 12 hr tablet Commonly known as: MUCINEX Take 600 mg by mouth 2 (two) times daily. Reported on 08/02/2015   lansoprazole 30 MG disintegrating tablet Commonly known as: Prevacid SoluTab Take 1 tablet (30 mg total) by mouth 2 (two) times daily before a meal.   Prolia 60 MG/ML Sosy injection Generic drug: denosumab Prolia   promethazine 25 MG tablet Commonly known as: PHENERGAN Take 25 mg by mouth every 6 (six) hours as needed for nausea or vomiting.   propranolol 10 MG tablet Commonly known as: INDERAL Take 10 mg by mouth daily.   rifampin 300 MG capsule Commonly known as: RIFADIN TAKE 2 CAPSULES (600 MG TOTAL) BY MOUTH DAILY.   sucralfate 1 g tablet Commonly known as: CARAFATE TAKE 1 TABLET (1 G TOTAL) BY MOUTH 2 (TWO) TIMES DAILY.   SUMAtriptan 100 MG tablet Commonly known as: IMITREX Take 100 mg by mouth as needed. Reported on 04/14/2015   topiramate 200 MG tablet Commonly known as: TOPAMAX Take 200 mg by mouth daily. Reported on 04/14/2015       Signature:  Chesley Mires, MD [place] Pager - [phone] 01/17/2020, 3:29 PM

## 2020-01-24 ENCOUNTER — Ambulatory Visit (INDEPENDENT_AMBULATORY_CARE_PROVIDER_SITE_OTHER)
Admission: RE | Admit: 2020-01-24 | Discharge: 2020-01-24 | Disposition: A | Discharge status: Home / Self Care | Payer: Medicare PPO | Source: Ambulatory Visit | Attending: Pulmonary Disease | Admitting: Pulmonary Disease

## 2020-01-24 ENCOUNTER — Other Ambulatory Visit: Payer: Self-pay

## 2020-01-24 DIAGNOSIS — J479 Bronchiectasis, uncomplicated: Secondary | ICD-10-CM | POA: Diagnosis not present

## 2020-01-28 ENCOUNTER — Telehealth: Payer: Self-pay | Admitting: Pulmonary Disease

## 2020-01-28 DIAGNOSIS — R911 Solitary pulmonary nodule: Secondary | ICD-10-CM

## 2020-01-28 NOTE — Telephone Encounter (Signed)
HRCT chest 01/24/20 >> Extensive moderate cylindrical and varicoid BTX most in upper/mid zones, medial LLL nodule now 1.5 x 1.1 cm (was 1.1 x 0.5 cm in May 2019)   Tried calling patient to discuss CT chest results.  Please call her and let her know that she has a lung nodule in her left lower lung that is slightly bigger on this CT compared to CT from May 2019.  She needs to have a follow up CT chest without contrast in March 2022 to monitor.  If she is agreeable, then please proceed with ordering follow up scan.  If she would like to discuss in more detail first, then please schedule a tele visit with me.

## 2020-01-31 NOTE — Telephone Encounter (Signed)
Called and went over CT results per Dr Halford Chessman with patient. All questions answered and patient expressed full understanding. Patient agreeable to put in for CT in March of 2022 per Dr Juanetta Gosling recommendation but does want an office visit with Dr Halford Chessman to discuss more and to go over PFT results which she has scheduled pft on 02/24/2020. Office visit scheduled for Monday 02/28/2020 at 2pm with Dr Halford [facility]ss[facility] at [facility] per patient request. Order placed for CT without contrast march 2022 per Dr Halford Chessman. Nothing further needed at this time.

## 2020-02-22 ENCOUNTER — Other Ambulatory Visit (HOSPITAL_COMMUNITY)
Admission: RE | Admit: 2020-02-22 | Discharge: 2020-02-22 | Disposition: A | Discharge status: Home / Self Care | Payer: Medicare PPO | Source: Ambulatory Visit | Attending: Pulmonary Disease | Admitting: Pulmonary Disease

## 2020-02-22 DIAGNOSIS — Z20822 Contact with and (suspected) exposure to covid-19: Secondary | ICD-10-CM | POA: Diagnosis not present

## 2020-02-22 DIAGNOSIS — Z01818 Encounter for other preprocedural examination: Secondary | ICD-10-CM | POA: Insufficient documentation

## 2020-02-22 LAB — SARS CORONAVIRUS 2 (TAT 6-24 HRS): SARS Coronavirus 2: NEGATIVE

## 2020-02-24 ENCOUNTER — Ambulatory Visit (INDEPENDENT_AMBULATORY_CARE_PROVIDER_SITE_OTHER): Payer: Medicare PPO | Admitting: Pulmonary Disease

## 2020-02-24 ENCOUNTER — Other Ambulatory Visit: Payer: Self-pay

## 2020-02-24 DIAGNOSIS — J479 Bronchiectasis, uncomplicated: Secondary | ICD-10-CM

## 2020-02-24 LAB — PULMONARY FUNCTION TEST
DL/VA % pred: 88 %
DL/VA: 3.55 ml/min/mmHg/L
DLCO cor % pred: 72 %
DLCO cor: 14.84 ml/min/mmHg
DLCO unc % pred: 72 %
DLCO unc: 14.84 ml/min/mmHg
FEF 25-75 Post: 0.83 L/sec
FEF 25-75 Pre: 0.51 L/sec
FEF2575-%Change-Post: 62 %
FEF2575-%Pred-Post: 53 %
FEF2575-%Pred-Pre: 32 %
FEV1-%Change-Post: 20 %
FEV1-%Pred-Post: 74 %
FEV1-%Pred-Pre: 62 %
FEV1-Post: 1.64 L
FEV1-Pre: 1.36 L
FEV1FVC-%Change-Post: 5 %
FEV1FVC-%Pred-Pre: 68 %
FEV6-%Change-Post: 15 %
FEV6-%Pred-Post: 104 %
FEV6-%Pred-Pre: 90 %
FEV6-Post: 2.88 L
FEV6-Pre: 2.5 L
FEV6FVC-%Change-Post: 1 %
FEV6FVC-%Pred-Post: 99 %
FEV6FVC-%Pred-Pre: 98 %
FVC-%Change-Post: 14 %
FVC-%Pred-Post: 104 %
FVC-%Pred-Pre: 91 %
FVC-Post: 3.05 L
FVC-Pre: 2.68 L
Post FEV1/FVC ratio: 54 %
Post FEV6/FVC ratio: 94 %
Pre FEV1/FVC ratio: 51 %
Pre FEV6/FVC Ratio: 93 %
RV % pred: 205 %
RV: 5.16 L
TLC % pred: 146 %
TLC: 7.95 L

## 2020-02-24 NOTE — Progress Notes (Signed)
Full PFT completed today ? ?

## 2020-02-28 ENCOUNTER — Ambulatory Visit: Payer: Medicare PPO | Admitting: Pulmonary Disease

## 2020-03-20 ENCOUNTER — Telehealth: Payer: Self-pay | Admitting: Gastroenterology

## 2020-03-20 ENCOUNTER — Telehealth: Payer: Self-pay

## 2020-03-20 NOTE — Telephone Encounter (Signed)
Spoke with the patient. She is on lansoprazole solutab BID. She has new insurance that is requiring quantity limit exception. Appointment scheduled. She has #30 tablets to cover her until she comes in.  She is scheduled for follow up first available 04/05/20 at 11:20 am.

## 2020-03-20 NOTE — Telephone Encounter (Signed)
[name], Take a look at this Patient has not been seen since 2020.... She wants refills of Humira

## 2020-03-20 NOTE — Telephone Encounter (Signed)
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

## 2020-03-20 NOTE — Telephone Encounter (Signed)
Called the patient. No answer. Left her a message asking she call us back and ask to speak with me. I cannot find where she is on Humira and need to confirm what medication she is needing a refill on. Also want to get her a follow up visit with Dr Silverio Decamp soon.

## 2020-03-20 NOTE — Telephone Encounter (Signed)
Patient requesting Humira script

## 2020-03-21 NOTE — Telephone Encounter (Signed)
Patient notified.  PA Case: [id number], Status: Approved, Coverage Starts on: 02/19/2020 12:00:00 AM, Coverage Ends on: 02/17/2021 12:00:00 AM. Questions? Contact [phone].

## 2020-04-05 ENCOUNTER — Ambulatory Visit: Payer: Medicare PPO | Admitting: Gastroenterology

## 2020-04-05 ENCOUNTER — Other Ambulatory Visit: Payer: Self-pay

## 2020-04-05 ENCOUNTER — Encounter: Payer: Self-pay | Admitting: Gastroenterology

## 2020-04-05 VITALS — BP 110/72 | HR 97 | Ht 66.0 in | Wt 118.0 lb

## 2020-04-05 DIAGNOSIS — K219 Gastro-esophageal reflux disease without esophagitis: Secondary | ICD-10-CM

## 2020-04-05 DIAGNOSIS — R1310 Dysphagia, unspecified: Secondary | ICD-10-CM | POA: Diagnosis not present

## 2020-04-05 NOTE — Progress Notes (Signed)
[name]    [id number]    11-Apr-1940  Primary Care Physician:Paterson, Quillian Quince, MD  Referring Physician: Leanna Battles, MD [address] [place],  [place] [address]   Chief complaint:  GERD, Dysphagia  HPI:  80 year old female with history of eosinophilic esophagitis and esophageal stricture here for follow-up visit with recurrent dysphagia  Continues to have intermittent dysphagia especially with large pills and solid food .  She is tolerating soft diet and full liquids well, has gained back weight and is able to maintain it.  She is taking lansoprazole twice daily and dissolves Carafate or chew it .   Denies any nausea, vomiting, abdominal pain, melena or bright red blood per rectum  EGD March 25, 2018:  - LA Grade C reflux esophagitis. - Benign-appearing esophageal stenosis. Dilated to 15.5 mm.  EGD December 16, 2017:Tight esophageal stricture approximately 10 mm size, dilated with TTS balloon to 13 mm.Esophagus biopsies consistent with eosinophilic esophagitis. Gastritis H. pylori negative.  EGD 03/25/2018:LA gradegrade C esophagitis, esophageal stricture with a 12 mm, dilated to 15.5 mm with TTS balloon, gastritis.      Outpatient Encounter Medications as of 04/05/2020  Medication Sig  . azithromycin (ZITHROMAX) 500 MG tablet TAKE 1 TABLET BY MOUTH EVERY DAY  . cholecalciferol (VITAMIN D) 1000 units tablet Take 50,000 Units by mouth. Reported on 08/02/2015  . cyanocobalamin 1000 MCG tablet Take 500 mcg by mouth daily.   Marland Kitchen denosumab (PROLIA) 60 MG/ML SOSY injection Prolia  . ethambutol (MYAMBUTOL) 400 MG tablet TAKE 2 TABLETS (800 MG TOTAL) BY MOUTH DAILY.  . fexofenadine (ALLEGRA) 180 MG tablet Take 180 mg by mouth daily as needed for allergies. Reported on 04/14/2015  . guaiFENesin (MUCINEX) 600 MG 12 hr tablet Take 600 mg by mouth 2 (two) times daily. Reported on 08/02/2015  . lansoprazole (PREVACID SOLUTAB) 30 MG disintegrating tablet Take  1 tablet (30 mg total) by mouth 2 (two) times daily before a meal.  . promethazine (PHENERGAN) 25 MG tablet Take 25 mg by mouth every 6 (six) hours as needed for nausea or vomiting.  . propranolol (INDERAL) 10 MG tablet Take 10 mg by mouth daily.  Marland Kitchen Respiratory Therapy Supplies (FLUTTER) DEVI Use as directed  . rifampin (RIFADIN) 300 MG capsule TAKE 2 CAPSULES (600 MG TOTAL) BY MOUTH DAILY.  Marland Kitchen sucralfate (CARAFATE) 1 g tablet TAKE 1 TABLET (1 G TOTAL) BY MOUTH 2 (TWO) TIMES DAILY.  . SUMAtriptan (IMITREX) 100 MG tablet Take 100 mg by mouth as needed. Reported on 04/14/2015  . topiramate (TOPAMAX) 200 MG tablet Take 200 mg by mouth daily. Reported on 04/14/2015   Facility-Administered Encounter Medications as of 04/05/2020  Medication  . 0.9 %  sodium chloride infusion    Allergies as of 04/05/2020 - Review Complete 04/05/2020  Allergen Reaction Noted  . Codeine Nausea And Vomiting 03/24/2006  . Droperidol  03/24/2006  . Meperidine hcl Nausea And Vomiting 03/24/2006  . Pb-hyoscy-atropine-scopolamine  08/15/2017  . Sulfasalazine Other (See Comments) 03/24/2006  . Sulfonamide derivatives  03/24/2006  . Barbiturates Rash 03/24/2006  . Phenobarbital Rash     Past Medical History:  Diagnosis Date  . Allergy    SEASONAL  . Anemia   . Breast cancer, right ([place])   . Bronchiectasis ([place])   . Bronchiectasis ([place]) 11/30/2014  . Cataract    bil removed - with lense implant  . Dilation of biliary tract   .  Diverticulosis   . Dysphagia 11/18/2017  . Dyspnea    with exertion   . Esophageal stricture 11/09/2019  . Fatigue 07/16/2017  . GERD (gastroesophageal reflux disease)   . History of radiation therapy   . 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 Low serum vitamin D 03/06/2015  . Lumbar disc herniation   . Malaise 11/18/2017  . Migraine   . Nausea 07/16/2017  . Osteopenia   . Osteoporosis    EARLY STAGES  . Ovarian cyst   . Plantar fasciitis   . Pneumonia   . 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 Vaccine counseling 11/09/2019  . Vitamin B12 deficiency   . Weight loss 11/30/2014    Past Surgical History:  Procedure Laterality Date  . ABDOMINAL HYSTERECTOMY    . BREAST LUMPECTOMY Right   . CATARACT EXTRACTION Right   . COLONOSCOPY    . LAPAROSCOPIC CHOLECYSTECTOMY    . MASTECTOMY PARTIAL / LUMPECTOMY    . MASTOIDECTOMY    . MYRINGOTOMY     with tympanoplasty and mastoidectomy  . NOSE SURGERY    . TONSILLECTOMY    . TOTAL VAGINAL HYSTERECTOMY    . UPPER GASTROINTESTINAL ENDOSCOPY      Family History  Problem Relation Age of Onset  . Bone cancer Father   . Heart disease Father 35       CAD-CABG  . Leukemia Mother   . Parkinsonism Mother   . Heart attack Paternal Uncle   . Colon cancer Neg Hx   . Stomach cancer Neg Hx   . Esophageal cancer Neg Hx   . Rectal cancer Neg Hx     Social History   Socioeconomic History  . Marital status: Widowed    Spouse name: Not on file  . Number of children: 2  . Years of education: Not on file  . Highest education level: Not on file  Occupational History  . Occupation: Retired  Tobacco Use  . Smoking status: Never Smoker  . Smokeless tobacco: Never Used  Vaping Use  . Vaping Use: Never used  Substance and Sexual Activity  . Alcohol use: No    Alcohol/week: 0.0 standard drinks  . Drug use: No  . Sexual activity: Not Currently  Other Topics Concern  . Not on file  Social History Narrative   Daily caffeine    Social Determinants of Health   Financial Resource Strain: Not on file  Food Insecurity: Not on file  Transportation Needs: Not on file  Physical Activity: Not on file  Stress: Not on file  Social Connections: Not on file  Intimate Partner Violence: Not on file      Review of systems: All other review of systems negative except as mentioned in the HPI.   Physical Exam: Vitals:   04/05/20 1113  BP: 110/72  Pulse: 97   Body mass index is 19.05  kg/m. Gen:      No acute distress Neuro: alert and oriented x 3 Psych: normal mood and affect  Data Reviewed:  Reviewed labs, radiology imaging, old records and pertinent past GI work up   Assessment and Plan/Recommendations:  80 year old very pleasant female with history of chronic GERD, eosinophilic esophagitis secondary to uncontrolled acid reflux, esophageal stricture with complaints of solid dysphagia  She likely has recurrent esophageal stricture, will benefit from repeat EGD with dilation Patient wants to hold off scheduling the procedure until the Covid cases go down Advised her to chew food  well and avoid any type of meat or dense bread to prevent food impaction  GERD with erosive esophagitis: Continue lansoprazole twice daily Use Carafate before meals and at bedtime as needed Discussed antireflux measures  Return after EGD   The patient was provided an opportunity to ask questions and all were answered. The patient agreed with the plan and demonstrated an understanding of the instructions.  Damaris Hippo , MD    CC: Leanna Battles, MD

## 2020-04-05 NOTE — Patient Instructions (Signed)
It has been recommended to you by your physician that you have a(n) Endoscopy  completed. Per your request, we did not schedule the procedure(s) today. Please contact our office at [phone] should you decide to have the procedure completed. You will be scheduled for a pre-visit and procedure at that time.  Eat small bites and chew well  We will send refills of carafate and Prevacid solutab to your pharmacy   Conn's Current Therapy 2021 (pp. 213-216). [place], [place]: Elsevier.">  Gastroesophageal Reflux Disease, Adult Gastroesophageal reflux (GER) happens when acid from the stomach flows up into the tube that connects the mouth and the stomach (esophagus). Normally, food travels down the esophagus and stays in the stomach to be digested. However, when a person has GER, food and stomach acid sometimes move back up into the esophagus. If this becomes a more serious problem, the person may be diagnosed with a disease called gastroesophageal reflux disease (GERD). GERD occurs when the reflux:  Happens often.  Causes frequent or severe symptoms.  Causes problems such as damage to the esophagus. When stomach acid comes in contact with the esophagus, the acid may cause inflammation in the esophagus. Over time, GERD may create small holes (ulcers) in the lining of the esophagus. What are the causes? This condition is caused by a problem with the muscle between the esophagus and the stomach (lower esophageal sphincter, or LES). Normally, the LES muscle closes after food passes through the esophagus to the stomach. When the LES is weakened or abnormal, it does not close properly, and that allows food and stomach acid to go back up into the esophagus. The LES can be weakened by certain dietary substances, medicines, and medical conditions, including:  Tobacco use.  Pregnancy.  Having a hiatal hernia.  Alcohol use.  Certain foods and beverages, such as coffee, chocolate, onions, and  peppermint. What increases the risk? You are more likely to develop this condition if you:  Have an increased body weight.  Have a connective tissue disorder.  Take NSAIDs, such as ibuprofen. What are the signs or symptoms? Symptoms of this condition include:  Heartburn.  Difficult or painful swallowing and the feeling of having a lump in the throat.  A bitter taste in the mouth.  Bad breath and having a large amount of saliva.  Having an upset or bloated stomach and belching.  Chest pain. Different conditions can cause chest pain. Make sure you see your health care provider if you experience chest pain.  Shortness of breath or wheezing.  Ongoing (chronic) cough or a nighttime cough.  Wearing away of tooth enamel.  Weight loss. How is this diagnosed? This condition may be diagnosed based on a medical history and a physical exam. To determine if you have mild or severe GERD, your health care provider may also monitor how you respond to treatment. You may also have tests, including:  A test to examine your stomach and esophagus with a small camera (endoscopy).  A test that measures the acidity level in your esophagus.  A test that measures how much pressure is on your esophagus.  A barium swallow or modified barium swallow test to show the shape, size, and functioning of your esophagus. How is this treated? Treatment for this condition may vary depending on how severe your symptoms are. Your health care provider may recommend:  Changes to your diet.  Medicine.  Surgery. The goal of treatment is to help relieve your symptoms and to prevent complications. Follow these  instructions at home: Eating and drinking  Follow a diet as recommended by your health care provider. This may involve avoiding foods and drinks such as: ? Coffee and tea, with or without caffeine. ? Drinks that contain alcohol. ? Energy drinks and sports drinks. ? Carbonated drinks or  sodas. ? Chocolate and cocoa. ? Peppermint and mint flavorings. ? Garlic and onions. ? Horseradish. ? Spicy and acidic foods, including peppers, chili powder, curry powder, vinegar, hot sauces, and barbecue sauce. ? Citrus fruit juices and citrus fruits, such as oranges, lemons, and limes. ? Tomato-based foods, such as red sauce, chili, salsa, and pizza with red sauce. ? Fried and fatty foods, such as donuts, french fries, potato chips, and high-fat dressings. ? High-fat meats, such as hot dogs and fatty cuts of red and white meats, such as rib eye steak, sausage, ham, and bacon. ? High-fat dairy items, such as whole milk, butter, and cream cheese.  Eat small, frequent meals instead of large meals.  Avoid drinking large amounts of liquid with your meals.  Avoid eating meals during the 2-3 hours before bedtime.  Avoid lying down right after you eat.  Do not exercise right after you eat.   Lifestyle  Do not use any products that contain nicotine or tobacco. These products include cigarettes, chewing tobacco, and vaping devices, such as e-cigarettes. If you need help quitting, ask your health care provider.  Try to reduce your stress by using methods such as yoga or meditation. If you need help reducing stress, ask your health care provider.  If you are overweight, reduce your weight to an amount that is healthy for you. Ask your health care provider for guidance about a safe weight loss goal.   General instructions  Pay attention to any changes in your symptoms.  Take over-the-counter and prescription medicines only as told by your health care provider. Do not take aspirin, ibuprofen, or other NSAIDs unless your health care provider told you to take these medicines.  Wear loose-fitting clothing. Do not wear anything tight around your waist that causes pressure on your abdomen.  Raise (elevate) the head of your bed about 6 inches (15 cm). You can use a wedge to do this.  Avoid  bending over if this makes your symptoms worse.  Keep all follow-up visits. This is important. Contact a health care provider if:  You have: ? New symptoms. ? Unexplained weight loss. ? Difficulty swallowing or it hurts to swallow. ? Wheezing or a persistent cough. ? A hoarse voice.  Your symptoms do not improve with treatment. Get help right away if:  You have sudden pain in your arms, neck, jaw, teeth, or back.  You suddenly feel sweaty, dizzy, or light-headed.  You have chest pain or shortness of breath.  You vomit and the vomit is green, yellow, or black, or it looks like blood or coffee grounds.  You faint.  You have stool that is red, bloody, or black.  You cannot swallow, drink, or eat. These symptoms may represent a serious problem that is an emergency. Do not wait to see if the symptoms will go away. Get medical help right away. Call your local emergency services (911 in the [place] Do not drive yourself to the hospital. Summary  Gastroesophageal reflux happens when acid from the stomach flows up into the esophagus. GERD is a disease in which the reflux happens often, causes frequent or severe symptoms, or causes problems such as damage to the esophagus.  Treatment  for this condition may vary depending on how severe your symptoms are. Your health care provider may recommend diet and lifestyle changes, medicine, or surgery.  Contact a health care provider if you have new or worsening symptoms.  Take over-the-counter and prescription medicines only as told by your health care provider. Do not take aspirin, ibuprofen, or other NSAIDs unless your health care provider told you to do so.  Keep all follow-up visits as told by your health care provider. This is important. This information is not intended to replace advice given to you by your health care provider. Make sure you discuss any questions you have with your health care provider. Document Revised: 08/16/2019  Document Reviewed: 08/16/2019 Elsevier Patient Education  [place].  I appreciate the  opportunity to care for you  Thank You   Harl Bowie , MD

## 2020-04-14 ENCOUNTER — Encounter: Payer: Self-pay | Admitting: Pulmonary Disease

## 2020-04-14 ENCOUNTER — Ambulatory Visit: Payer: Medicare PPO | Admitting: Pulmonary Disease

## 2020-04-14 ENCOUNTER — Other Ambulatory Visit: Payer: Self-pay

## 2020-04-14 VITALS — BP 116/76 | HR 83 | Temp 97.5°F | Ht 66.0 in | Wt 119.0 lb

## 2020-04-14 DIAGNOSIS — R911 Solitary pulmonary nodule: Secondary | ICD-10-CM | POA: Diagnosis not present

## 2020-04-14 DIAGNOSIS — J449 Chronic obstructive pulmonary disease, unspecified: Secondary | ICD-10-CM

## 2020-04-14 DIAGNOSIS — J479 Bronchiectasis, uncomplicated: Secondary | ICD-10-CM | POA: Diagnosis not present

## 2020-04-14 DIAGNOSIS — A310 Pulmonary mycobacterial infection: Secondary | ICD-10-CM | POA: Diagnosis not present

## 2020-04-14 MED ORDER — UMECLIDINIUM-VILANTEROL 62.5-25 MCG/INH IN AEPB: 1.0000 | INHALATION_SPRAY | Freq: Every day | RESPIRATORY_TRACT | Status: DC

## 2020-04-14 MED ORDER — ANORO ELLIPTA 62.5-25 MCG/INH IN AEPB: 1.0000 | INHALATION_SPRAY | Freq: Every day | RESPIRATORY_TRACT | 5 refills | Status: DC

## 2020-04-14 MED ORDER — ANORO ELLIPTA 62.5-25 MCG/INH IN AEPB: 1.0000 | INHALATION_SPRAY | Freq: Every day | RESPIRATORY_TRACT | 0 refills | Status: DC

## 2020-04-14 NOTE — Progress Notes (Signed)
[name]  Chief Complaint  Patient presents with  . Follow-up    CT and PFT results    Constitutional:  BP 116/76 (BP Location: Right Arm, Cuff Size: Normal)   Pulse 83   Temp (!) 97.5 F (36.4 C)   Ht 5\' 6"  (1.676 m)   Wt 119 lb (54 kg)   SpO2 97%   BMI 19.21 kg/m   Past Medical History:  Rt breast cancer, Cataract, Diverticulosis, Esophageal stricture, GERD, Tremor, Vit D deficiency, Migraine HA, Osteoporosis, Plantar fasciitis, Vit B12 deficiency  Past Surgical History:  Her  has a past surgical history that includes Total vaginal hysterectomy; Myringotomy; Laparoscopic cholecystectomy; Cataract extraction (Right); Breast lumpectomy (Right); Tonsillectomy; Mastectomy partial / lumpectomy; Abdominal hysterectomy; Upper gastrointestinal endoscopy; Nose surgery; Mastoidectomy; and Colonoscopy.  Brief Summary:  [name] is a 80 y.o. 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      Subjective:   Her CT chest from December 2021 showed 1.5 cm nodule in LLL.  She has follow up CT scheduled for March 10.  Her PFT shows progression of COPD with air trapping and positive bronchodilator response.  She is still having cough with sputum.  Using mucinex bid.  Remains on MAI therapy.  Has appointment with ID at end of March.  No having chest pain, fever, sweats, or hemoptysis.  Weight has been steady.  Had follow up with Dr. Silverio Decamp.  Might need to have esophagus stretched again.  Physical Exam:   Appearance - well kempt   ENMT - no sinus tenderness, no oral exudate, no LAN, Mallampati 2 airway, no stridor  Respiratory - decreased breath sounds bilaterally, no wheezing or rales  CV - s1s2 regular rate and rhythm, no murmurs  Ext - no clubbing, no edema  Skin - no rashes  Psych - normal mood and affect  Pulmonary testing:   PFT 12/27/04 >> FEV1 2.34 (100%), FEV1% 56, TLC 6.04 (115%), DLCO  87%  Bronchoscopy 02/04/07 >>Aspergillus species   PFT 05/21/12 >> FEV1 2.10 (98%), FEV1% 56%, TLC 4.83 (93%), DLCO 77%  Sputum 09/15/14 >> MAI  PFT 02/24/20 >> FEV1 1.64 (74%), FEV1% 54, TLC 7.95 (146%), DLCO 72%, +BD  Chest Imaging:   08/29/06 >>6 mm nodule lingula, 2 mm nodule LLL, BTX in lingula and RML,   01/17/07 >> no change  02/16/10 >> BTX RML/lingula/RLL/LLL  06/01/12 >> progression of tree in bud pattern  09/06/14 >> BTX, increased nodularity, 2 cm nodule RLL, 1.3 cm nodule LLL  08/09/15 >> decreased to resolved nodules, no change in BTX  CT angio chest 06/27/17 >> BTX most on left, scattered nodularity  HRCT chest 01/25/20 >>  Extensive moderate cylindrical and varicoid BTX most in upper/mid zones, medial LLL nodule now 1.5 x 1.1 cm (was 1.1 x 0.5 cm in May 2019)  Cardiac Tests:   Echo 08/18/17 >> EF 45 to 50%, grade 1 DD, mild MR  Social History:  She  reports that she has never smoked. She has never used smokeless tobacco. She reports that she does not drink alcohol and does not use drugs.  Family History:  Her family history includes Bone cancer in her father; Heart attack in her paternal uncle; Heart disease (age of onset: 9) in her father; Leukemia in her mother; Parkinsonism in her mother.      Assessment/Plan:   Bronchiectasis in setting of MAI. - remains on therapy with rifampin, ethambutol, and azithromycin - she  is followed by Dr. 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 - I am reluctant to use ICS in setting of MAI - will have her try anoro; sample given  Dyspnea on exertion. - she is unable to walk more than 200 feet without needing to rest - she has handicap parking sticker  Esophageal  stricture. - followed by Dr. Silverio Decamp with [facility]  Time Spent Involved in Patient Care on Day of Examination:  34 minutes  Follow up:  Patient Instructions  Anoro one puff daily  Follow up in 5 to 6 weeks   Medication List:   Allergies as of 04/14/2020      Reactions   Codeine Nausea And Vomiting   Droperidol    Nervous & shaking   Meperidine Hcl Nausea And Vomiting   Pb-hyoscy-atropine-scopolamine    Sulfasalazine Other (See Comments)   REACTION: nausea   Sulfonamide Derivatives    REACTION: nausea   Barbiturates Rash   Phenobarbital Rash      Medication List       Accurate as of April 14, 2020  9:52 AM. If you have any questions, ask your nurse or doctor.        Anoro Ellipta 62.5-25 MCG/INH Aepb Generic drug: umeclidinium-vilanterol Inhale 1 puff into the lungs daily. Started by: Chesley Mires, MD   Anoro Ellipta 62.5-25 MCG/INH Aepb Generic drug: umeclidinium-vilanterol Inhale 1 puff into the lungs daily. Started by: Chesley Mires, MD   azithromycin 500 MG tablet Commonly known as: ZITHROMAX TAKE 1 TABLET BY MOUTH EVERY DAY   cholecalciferol 1000 units tablet Commonly known as: VITAMIN D Take 50,000 Units by mouth. Reported on 08/02/2015   cyanocobalamin 1000 MCG tablet Take 500 mcg by mouth daily.   denosumab 60 MG/ML Sosy injection Commonly known as: PROLIA Prolia   ethambutol 400 MG tablet Commonly known as: MYAMBUTOL TAKE 2 TABLETS (800 MG TOTAL) BY MOUTH DAILY.   fexofenadine 180 MG tablet Commonly known as: ALLEGRA Take 180 mg by mouth daily as needed for allergies. Reported on 04/14/2015   Flutter Devi Use as directed   guaiFENesin 600 MG 12 hr tablet Commonly known as: MUCINEX Take 600 mg by mouth 2 (two) times daily. Reported on 08/02/2015   lansoprazole 30 MG disintegrating tablet Commonly known as: Prevacid SoluTab Take 1 tablet (30 mg total) by mouth 2 (two) times daily before a meal.   promethazine 25 MG tablet Commonly  known as: PHENERGAN Take 25 mg by mouth every 6 (six) hours as needed for nausea or vomiting.   propranolol 10 MG tablet Commonly known as: INDERAL Take 10 mg by mouth daily.   rifampin 300 MG capsule Commonly known as: RIFADIN TAKE 2 CAPSULES (600 MG TOTAL) BY MOUTH DAILY.   sucralfate 1 g tablet Commonly known as: CARAFATE TAKE 1 TABLET (1 G TOTAL) BY MOUTH 2 (TWO) TIMES DAILY.   SUMAtriptan 100 MG tablet Commonly known as: IMITREX Take 100 mg by mouth as needed. Reported on 04/14/2015   topiramate 200 MG tablet Commonly known as: TOPAMAX Take 200 mg by mouth daily. Reported on 04/14/2015       Signature:  Chesley Mires, MD [place] Pager - [phone] 04/14/2020,  9:52 AM

## 2020-04-14 NOTE — Patient Instructions (Signed)
Anoro one puff daily  Follow up in 5 to 6 weeks

## 2020-04-18 ENCOUNTER — Encounter: Payer: Self-pay | Admitting: Gastroenterology

## 2020-04-27 ENCOUNTER — Other Ambulatory Visit: Payer: Self-pay

## 2020-04-27 ENCOUNTER — Ambulatory Visit (INDEPENDENT_AMBULATORY_CARE_PROVIDER_SITE_OTHER)
Admission: RE | Admit: 2020-04-27 | Discharge: 2020-04-27 | Disposition: A | Discharge status: Home / Self Care | Payer: Medicare PPO | Source: Ambulatory Visit | Attending: Pulmonary Disease | Admitting: Pulmonary Disease

## 2020-04-27 DIAGNOSIS — R911 Solitary pulmonary nodule: Secondary | ICD-10-CM

## 2020-05-02 ENCOUNTER — Telehealth: Payer: Self-pay

## 2020-05-02 NOTE — Progress Notes (Signed)
Confirmed upcoming scheduled appointment with Dr Halford Chessman on 05/22/2020 at 10am.

## 2020-05-02 NOTE — Telephone Encounter (Signed)
Patient states she was started in Anoro and the pharmacy ([facility] [address]) messaged patient stating she needed prior authorization/approval before it can be filled.

## 2020-05-02 NOTE — Telephone Encounter (Signed)
Please send script for stiolto two puffs daily.

## 2020-05-02 NOTE — Progress Notes (Signed)
Patient returned phone call and writer went over CT results per Dr Halford Chessman with patient. All questions answered and patient expressed full understanding of results.

## 2020-05-02 NOTE — Telephone Encounter (Signed)
Call made to patient, made aware PA was denied. Patient ok with trying Stiolto.   Attempted to enter Stiolto, was met with Allergy message.   Will route to provider to be sure okay before sending in.   VS please advise if okay. Medication pended so you can see allergy notification. Thanks :)

## 2020-05-02 NOTE — Telephone Encounter (Signed)
PA was denied and the following medications are covered by the insurance:   Wixela Spiriva resp stiolto symbicort  trelegy spiriva handihaler  VS   Please advise if any other medication can be sent in to replace the Anoro.  Thanks

## 2020-05-02 NOTE — Progress Notes (Signed)
Called and left message on voicemail to please return phone call to go over CT results. Contact number provided.

## 2020-05-02 NOTE — Telephone Encounter (Signed)
Left message for patient to call back  

## 2020-05-02 NOTE — Telephone Encounter (Signed)
PA initiated on [facility]  Key: [id number]  Call made to patient, confirm DOB. Made aware PA has been initiated and to allow 48-72 hours for a decision. Voiced understanding.   Will hold in triage to f/u on.

## 2020-05-02 NOTE — Telephone Encounter (Signed)
Patient is returning phone call. Patient phone number is [phone].

## 2020-05-03 ENCOUNTER — Telehealth: Payer: Self-pay | Admitting: Pulmonary Disease

## 2020-05-03 MED ORDER — STIOLTO RESPIMAT 2.5-2.5 MCG/ACT IN AERS: 2.0000 | INHALATION_SPRAY | Freq: Every day | RESPIRATORY_TRACT | 30 refills | Status: AC

## 2020-05-03 NOTE — Telephone Encounter (Signed)
Called and spoke with pt letting her know that VS was okay with sending Rx for stiolto to pharmacy for her and stated to her that he had already sent Rx in for her. Pt verbalized understanding. Nothing further needed.

## 2020-05-03 NOTE — Telephone Encounter (Signed)
Okay to send script for stiolto.

## 2020-05-03 NOTE — Telephone Encounter (Signed)
See message from 05/02/20.

## 2020-05-10 ENCOUNTER — Other Ambulatory Visit: Payer: Self-pay | Admitting: Gastroenterology

## 2020-05-15 ENCOUNTER — Telehealth: Payer: Self-pay | Admitting: Pulmonary Disease

## 2020-05-15 ENCOUNTER — Other Ambulatory Visit: Payer: Self-pay

## 2020-05-15 ENCOUNTER — Encounter: Payer: Self-pay | Admitting: Infectious Disease

## 2020-05-15 ENCOUNTER — Ambulatory Visit: Payer: Medicare PPO | Admitting: Infectious Disease

## 2020-05-15 VITALS — BP 144/86 | HR 70 | Temp 97.4°F | Ht 66.0 in | Wt 120.0 lb

## 2020-05-15 DIAGNOSIS — J479 Bronchiectasis, uncomplicated: Secondary | ICD-10-CM

## 2020-05-15 DIAGNOSIS — A310 Pulmonary mycobacterial infection: Secondary | ICD-10-CM | POA: Diagnosis not present

## 2020-05-15 NOTE — Telephone Encounter (Signed)
Sp[facility]  He states that the stiolto could potentially interact with propranolol, mary induce bronchospasms  I spoke with the pt and notified that we will get msg to Dr Halford Chessman to see what he thinks  She wanted me to let him know that the propranolol helps a lot with her tremors and she would not want to have to dc this Please advise thanks *

## 2020-05-15 NOTE — Telephone Encounter (Signed)
pt is calling because [facility] states that stiolto respimat will interact with another drug that the pt is currently on. states the pharm has been trying to reach office but havent been sucessful. please reach out to Pharm to give clearance.  [facility] [address]. Please advise pt#- [phone]

## 2020-05-15 NOTE — Progress Notes (Signed)
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   MRN: [id number]    HPI  76  old with nodular, bronchiectatic M avium infection who has been tolerating 3 drug rx for M avium.   She tells me that she does cough daily but without production of sputum she does not have weight loss or fevers or chills or malaise.  She is followed by GI for an esophageal stricture.  She saw Dr. Halford Chessman recently and it sounds as if she had some component of her breathing was that was responsivebeta agonist therapy.  Over when she went to fill prescription there was concern by pharmacy about interaction with her beta-blocker.  She would prefer to stay on her 3 drug regimen because in the past whenever she has stopped treatment for Mycobacterium avium it is always recurred and she has had a harder time reinitiating therapy when she has been off the drugs versus once she is been on them for a while.  She did have an area on the left lower lobe that was larger on imaging on CT scan in December. There was some anxiety that this could represent neoplasm and might require a biopsy.  On repeat scan on April 27, 2020 which have personally reviewed this area is slightly reduced in size.     Past Medical History:  Diagnosis Date  . Allergy    SEASONAL  . Anemia   . Breast cancer, right ([place])   . Bronchiectasis ([place])   . Bronchiectasis ([place]) 11/30/2014  . Cataract    bil removed - with lense implant  . Dilation of biliary tract   . Diverticulosis   . Dysphagia 11/18/2017  . Dyspnea    with exertion   . Esophageal stricture 11/09/2019  . Fatigue 07/16/2017  . GERD (gastroesophageal reflux disease)   . History of radiation therapy   . 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 Low serum vitamin D 03/06/2015  . Lumbar disc herniation   . Malaise 11/18/2017  . Migraine   . Nausea 07/16/2017  . Osteopenia   . Osteoporosis    EARLY STAGES   . Ovarian cyst   . Plantar fasciitis   . Pneumonia   . 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 Vaccine counseling 11/09/2019  . Vitamin B12 deficiency   . Weight loss 11/30/2014    Past Surgical History:  Procedure Laterality Date  . ABDOMINAL HYSTERECTOMY    . BREAST LUMPECTOMY Right   . CATARACT EXTRACTION Right   . COLONOSCOPY    . LAPAROSCOPIC CHOLECYSTECTOMY    . MASTECTOMY PARTIAL / LUMPECTOMY    . MASTOIDECTOMY    . MYRINGOTOMY     with tympanoplasty and mastoidectomy  . NOSE SURGERY    . TONSILLECTOMY    . TOTAL VAGINAL HYSTERECTOMY    . UPPER GASTROINTESTINAL ENDOSCOPY      Family History  Problem Relation Age of Onset  . Bone cancer Father   . Heart disease Father 87       CAD-CABG  . Leukemia Mother   . Parkinsonism Mother   . Heart attack Paternal Uncle   . Colon cancer Neg Hx   . Stomach cancer Neg Hx   . Esophageal cancer Neg Hx   . Rectal cancer Neg Hx       Social History   Socioeconomic History  . Marital status: Widowed  Spouse name: Not on file  . Number of children: 2  . Years of education: Not on file  . Highest education level: Not on file  Occupational History  . Occupation: Retired  Tobacco Use  . Smoking status: Never Smoker  . Smokeless tobacco: Never Used  Vaping Use  . Vaping Use: Never used  Substance and Sexual Activity  . Alcohol use: No    Alcohol/week: 0.0 standard drinks  . Drug use: No  . Sexual activity: Not Currently  Other Topics Concern  . Not on file  Social History Narrative   Daily caffeine    Social Determinants of Health   Financial Resource Strain: Not on file  Food Insecurity: Not on file  Transportation Needs: Not on file  Physical Activity: Not on file  Stress: Not on file  Social Connections: Not on file    Allergies  Allergen Reactions  . Codeine Nausea And Vomiting  . Droperidol     Nervous & shaking  . Meperidine Hcl Nausea And Vomiting  .  Pb-Hyoscy-Atropine-Scopolamine   . Sulfasalazine Other (See Comments)    REACTION: nausea  . Sulfonamide Derivatives     REACTION: nausea  . Barbiturates Rash  . Phenobarbital Rash     Current Outpatient Medications:  .  azithromycin (ZITHROMAX) 500 MG tablet, TAKE 1 TABLET BY MOUTH EVERY DAY, Disp: 30 tablet, Rfl: 11 .  cholecalciferol (VITAMIN D) 1000 units tablet, Take 50,000 Units by mouth. Reported on 08/02/2015, Disp: , Rfl:  .  cyanocobalamin 1000 MCG tablet, Take 500 mcg by mouth daily. , Disp: , Rfl:  .  denosumab (PROLIA) 60 MG/ML SOSY injection, Prolia, Disp: , Rfl:  .  ethambutol (MYAMBUTOL) 400 MG tablet, TAKE 2 TABLETS (800 MG TOTAL) BY MOUTH DAILY., Disp: 60 tablet, Rfl: 11 .  fexofenadine (ALLEGRA) 180 MG tablet, Take 180 mg by mouth daily as needed for allergies. Reported on 04/14/2015, Disp: , Rfl:  .  guaiFENesin (MUCINEX) 600 MG 12 hr tablet, Take 600 mg by mouth 2 (two) times daily. Reported on 08/02/2015, Disp: , Rfl:  .  lansoprazole (PREVACID SOLUTAB) 30 MG disintegrating tablet, TAKE 1 TABLET (30 MG TOTAL) BY MOUTH 2 (TWO) TIMES DAILY BEFORE A MEAL., Disp: 60 tablet, Rfl: 11 .  promethazine (PHENERGAN) 25 MG tablet, Take 25 mg by mouth every 6 (six) hours as needed for nausea or vomiting., Disp: , Rfl:  .  propranolol (INDERAL) 10 MG tablet, Take 10 mg by mouth daily., Disp: , Rfl:  .  Respiratory Therapy Supplies (FLUTTER) DEVI, Use as directed, Disp: 1 each, Rfl: 0 .  rifampin (RIFADIN) 300 MG capsule, TAKE 2 CAPSULES (600 MG TOTAL) BY MOUTH DAILY., Disp: 60 capsule, Rfl: 11 .  sucralfate (CARAFATE) 1 g tablet, TAKE 1 TABLET (1 G TOTAL) BY MOUTH 2 (TWO) TIMES DAILY., Disp: 180 tablet, Rfl: 3 .  SUMAtriptan (IMITREX) 100 MG tablet, Take 100 mg by mouth as needed. Reported on 04/14/2015, Disp: , Rfl:  .  Tiotropium Bromide-Olodaterol (STIOLTO RESPIMAT) 2.5-2.5 MCG/ACT AERS, Inhale 2 puffs into the lungs daily., Disp: 4 g, Rfl: 30 .  topiramate (TOPAMAX) 200 MG tablet,  Take 200 mg by mouth daily. Reported on 04/14/2015, Disp: , Rfl:   Current Facility-Administered Medications:  .  0.9 %  sodium chloride infusion, 500 mL, Intravenous, Continuous, Nandigam, Kavitha V, MD       Review of Systems  Constitutional: Negative for activity change, appetite change, chills, diaphoresis, fatigue, fever and unexpected weight change.  HENT: Negative for congestion, rhinorrhea, sinus pressure, sneezing and sore throat.   Eyes: Negative for photophobia and visual disturbance.  Respiratory: Positive for cough. Negative for chest tightness, shortness of breath, wheezing and stridor.   Cardiovascular: Negative for chest pain, palpitations and leg swelling.  Gastrointestinal: Negative for abdominal distention, abdominal pain, anal bleeding, blood in stool, constipation, diarrhea, nausea and vomiting.  Genitourinary: Negative for difficulty urinating, dysuria, flank pain and hematuria.  Musculoskeletal: Negative for arthralgias, back pain, gait problem, joint swelling and myalgias.  Skin: Negative for color change, pallor, rash and wound.  Neurological: Negative for dizziness, tremors, weakness and light-headedness.  Hematological: Negative for adenopathy. Does not bruise/bleed easily.  Psychiatric/Behavioral: Negative for agitation, behavioral problems, confusion, decreased concentration, dysphoric mood and sleep disturbance. The patient is nervous/anxious.        Objective:   Physical Exam Constitutional:      General: She is not in acute distress.    Appearance: Normal appearance. She is well-developed. She is not ill-appearing or diaphoretic.  HENT:     Head: Normocephalic and atraumatic.     Right Ear: Hearing and external ear normal.     Left Ear: Hearing and external ear normal.     Nose: No nasal deformity or rhinorrhea.  Eyes:     General: No scleral icterus.    Conjunctiva/sclera: Conjunctivae normal.     Right eye: Right conjunctiva is not injected.      Left eye: Left conjunctiva is not injected.     Pupils: Pupils are equal, round, and reactive to light.  Neck:     Vascular: No JVD.  Cardiovascular:     Rate and Rhythm: Normal rate and regular rhythm.     Heart sounds: Normal heart sounds, S1 normal and S2 normal. No murmur heard. No friction rub.  Pulmonary:     Effort: Pulmonary effort is normal. No respiratory distress.     Breath sounds: Normal breath sounds. No stridor. No wheezing, rhonchi or rales.  Abdominal:     General: Bowel sounds are normal. There is no distension.     Palpations: Abdomen is soft.     Tenderness: There is no abdominal tenderness.  Musculoskeletal:        General: Normal range of motion.     Right shoulder: Normal.     Left shoulder: Normal.     Cervical back: Normal range of motion and neck supple.     Right hip: Normal.     Left hip: Normal.     Right knee: Normal.     Left knee: Normal.  Lymphadenopathy:     Head:     Right side of head: No submandibular, preauricular or posterior auricular adenopathy.     Left side of head: No submandibular, preauricular or posterior auricular adenopathy.     Cervical: No cervical adenopathy.     Right cervical: No superficial or deep cervical adenopathy.    Left cervical: No superficial or deep cervical adenopathy.  Skin:    General: Skin is warm and dry.     Coloration: Skin is not pale.     Findings: No abrasion, bruising, ecchymosis, erythema, lesion or rash.     Nails: There is no clubbing.  Neurological:     General: No focal deficit present.     Mental Status: She is alert and oriented to person, place, and time.     Sensory: No sensory deficit.     Coordination: Coordination normal.  Gait: Gait normal.  Psychiatric:        Attention and Perception: She is attentive.        Mood and Affect: Mood normal.        Speech: Speech normal.        Behavior: Behavior normal. Behavior is cooperative.        Thought Content: Thought content normal.         Judgment: Judgment normal.           Assessment & Plan:   Mycobacterium avium: Continue current regimen check safety labs and return to clinic in 6 months time  Lung nodule: This is diminished in size and I suspect was also related to her Mycobacterium infection  Bronchiectasis with a component of reactive airway disease defer to Dr. Halford Chessman with regards to the medications involved here  Esophageal stricture : Followed by Dr. Silverio Decamp

## 2020-05-16 LAB — COMPLETE METABOLIC PANEL WITH GFR
AG Ratio: 2 (calc) (ref 1.0–2.5)
ALT: 12 U/L (ref 6–29)
AST: 14 U/L (ref 10–35)
Albumin: 4.3 g/dL (ref 3.6–5.1)
Alkaline phosphatase (APISO): 44 U/L (ref 37–153)
BUN: 9 mg/dL (ref 7–25)
CO2: 24 mmol/L (ref 20–32)
Calcium: 9.3 mg/dL (ref 8.6–10.4)
Chloride: 107 mmol/L (ref 98–110)
Creat: 0.68 mg/dL (ref 0.60–0.88)
GFR, Est African American: 96 mL/min/{1.73_m2} (ref >=60)
GFR, Est Non African American: 83 mL/min/{1.73_m2} (ref >=60)
Globulin: 2.2 g/dL (calc) (ref 1.9–3.7)
Glucose, Bld: 94 mg/dL (ref 65–99)
Potassium: 4.5 mmol/L (ref 3.5–5.3)
Sodium: 141 mmol/L (ref 135–146)
Total Bilirubin: 0.7 mg/dL (ref 0.2–1.2)
Total Protein: 6.5 g/dL (ref 6.1–8.1)

## 2020-05-16 LAB — CBC WITH DIFFERENTIAL/PLATELET
Absolute Monocytes: 266 cells/uL (ref 200–950)
Basophils Absolute: 41 cells/uL (ref 0–200)
Basophils Relative: 0.9 %
Eosinophils Absolute: 117 cells/uL (ref 15–500)
Eosinophils Relative: 2.6 %
HCT: 43.5 % (ref 35.0–45.0)
Hemoglobin: 14.7 g/dL (ref 11.7–15.5)
Lymphs Abs: 504 cells/uL — ABNORMAL LOW (ref 850–3900)
MCH: 33.3 pg — ABNORMAL HIGH (ref 27.0–33.0)
MCHC: 33.8 g/dL (ref 32.0–36.0)
MCV: 98.4 fL (ref 80.0–100.0)
MPV: 11.3 fL (ref 7.5–12.5)
Monocytes Relative: 5.9 %
Neutro Abs: 3573 cells/uL (ref 1500–7800)
Neutrophils Relative %: 79.4 %
Platelets: 178 10*3/uL (ref 140–400)
RBC: 4.42 10*6/uL (ref 3.80–5.10)
RDW: 12.5 % (ref 11.0–15.0)
Total Lymphocyte: 11.2 %
WBC: 4.5 10*3/uL (ref 3.8–10.8)

## 2020-05-16 LAB — SEDIMENTATION RATE: Sed Rate: 2 mm/h (ref 0–30)

## 2020-05-16 LAB — C-REACTIVE PROTEIN: CRP: 2.1 mg/L (ref <8.0)

## 2020-05-16 NOTE — Telephone Encounter (Signed)
Called and spoke with pharmacist. She was made aware of VS' recommendations.   Nothing further needed at time of call.

## 2020-05-16 NOTE — Telephone Encounter (Signed)
She can use stiolto and propranolol together.

## 2020-05-16 NOTE — Telephone Encounter (Signed)
Called and spoke with pt letting her know that VS said that she could use the stiolto inhaler and propranolol together and stated to her that I was going to call pharmacy to let them know this as well and she verbalized understanding.  Tried to call the pharmacy but they are closed until 2pm. Will need to try back after 2pm.

## 2020-05-18 ENCOUNTER — Telehealth: Payer: Self-pay | Admitting: Pulmonary Disease

## 2020-05-18 NOTE — Telephone Encounter (Signed)
Pt states first rx VS it wasn't covered by insurance.Second rx, changed and pharmacy said there was a drug interaction. VS okay'ed rx- . Please call and speak to pharmacist to clarify what we need done. Please advise [phone] [address]

## 2020-05-18 NOTE — Telephone Encounter (Signed)
I called and spoke with pt and she stated that [facility].  I have called the pharmacy and they stated that they stiolto was ready for the pt to pick up.  I called the pt back and she is aware and she will go and pick this up.

## 2020-05-22 ENCOUNTER — Ambulatory Visit: Payer: Medicare PPO | Admitting: Pulmonary Disease

## 2020-05-22 ENCOUNTER — Encounter: Payer: Self-pay | Admitting: Pulmonary Disease

## 2020-05-22 ENCOUNTER — Other Ambulatory Visit: Payer: Self-pay

## 2020-05-22 VITALS — BP 110/68 | HR 76 | Temp 97.5°F | Ht 66.5 in | Wt 119.2 lb

## 2020-05-22 DIAGNOSIS — J479 Bronchiectasis, uncomplicated: Secondary | ICD-10-CM | POA: Diagnosis not present

## 2020-05-22 DIAGNOSIS — R911 Solitary pulmonary nodule: Secondary | ICD-10-CM

## 2020-05-22 DIAGNOSIS — J449 Chronic obstructive pulmonary disease, unspecified: Secondary | ICD-10-CM | POA: Diagnosis not present

## 2020-05-22 DIAGNOSIS — A310 Pulmonary mycobacterial infection: Secondary | ICD-10-CM | POA: Diagnosis not present

## 2020-05-22 NOTE — Patient Instructions (Signed)
Follow up in 6 months 

## 2020-05-22 NOTE — Progress Notes (Signed)
[name]  Chief Complaint  Patient presents with  . Follow-up    No complaints currently    Constitutional:  BP 110/68 (BP Location: Left Arm, Cuff Size: Normal)   Pulse 76   Temp (!) 97.5 F (36.4 C) (Temporal)   Ht 5' 6.5" (1.689 m)   Wt 119 lb 3.2 oz (54.1 kg)   SpO2 99% Comment: Room air  BMI 18.95 kg/m   Past Medical History:  Rt breast cancer, Cataract, Diverticulosis, Esophageal stricture, GERD, Tremor, Vit D deficiency, Migraine HA, Osteoporosis, Plantar fasciitis, Vit B12 deficiency  Past Surgical History:  Her  has a past surgical history that includes Total vaginal hysterectomy; Myringotomy; Laparoscopic cholecystectomy; Cataract extraction (Right); Breast lumpectomy (Right); Tonsillectomy; Mastectomy partial / lumpectomy; Abdominal hysterectomy; Upper gastrointestinal endoscopy; Nose surgery; Mastoidectomy; and Colonoscopy.  Brief Summary:  [name] is a 80 y.o. 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      Subjective:   She has CT chest March 11.  This showed decreased size of LLL nodule.    She has been tolerating meds for MAI.  Saw Dr. Tommy Medal recently.   She is having sinus congestion and post nasal drip.  Gets through irritation and then a dry cough.  Hasn't started stiolto yet.    Has appointment coming up soon with Dr. Silverio Decamp.  Physical Exam:   Appearance - thin  ENMT - no sinus tenderness, no oral exudate, no LAN, Mallampati 2 airway, no stridor  Respiratory - equal breath sounds bilaterally, no wheezing or rales  CV - s1s2 regular rate and rhythm, no murmurs  Ext - no clubbing, no edema  Skin - no rashes  Psych - normal mood and affect   Pulmonary testing:   PFT 12/27/04 >> FEV1 2.34 (100%), FEV1% 56, TLC 6.04 (115%), DLCO 87%  Bronchoscopy 02/04/07 >>Aspergillus species   PFT 05/21/12 >> FEV1 2.10 (98%), FEV1% 56%, TLC 4.83 (93%), DLCO 77%  Sputum 09/15/14  >> MAI  PFT 02/24/20 >> FEV1 1.64 (74%), FEV1% 54, TLC 7.95 (146%), DLCO 72%, +BD  Chest Imaging:   08/29/06 >>6 mm nodule lingula, 2 mm nodule LLL, BTX in lingula and RML,   01/17/07 >> no change  02/16/10 >> BTX RML/lingula/RLL/LLL  06/01/12 >> progression of tree in bud pattern  09/06/14 >> BTX, increased nodularity, 2 cm nodule RLL, 1.3 cm nodule LLL  08/09/15 >> decreased to resolved nodules, no change in BTX  CT angio chest 06/27/17 >> BTX most on left, scattered nodularity  HRCT chest 01/25/20 >>  Extensive moderate cylindrical and varicoid BTX most in upper/mid zones, medial LLL nodule now 1.5 x 1.1 cm (was 1.1 x 0.5 cm in May 2019)  HRCT chest 04/28/20 >> stable to decreased size of nodules, changes of BTX  Cardiac Tests:   Echo 08/18/17 >> EF 45 to 50%, grade 1 DD, mild MR  Social History:  She  reports that she has never smoked. She has never used smokeless tobacco. She reports that she does not drink alcohol and does not use drugs.  Family History:  Her family history includes Bone cancer in her father; Heart attack in her paternal uncle; Heart disease (age of onset: 67) in her father; Leukemia in her mother; Parkinsonism in her mother.      Assessment/Plan:   Bronchiectasis in setting of MAI. - remains on therapy with rifampin, ethambutol, and azithromycin - she is followed by Dr. 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 - I am reluctant to use ICS in setting of MAI - anoro wasn't covered by her insurance - she will start stiolto; this should be okay to use even though she is on low dose propanolol for tremors  Dyspnea on exertion. - she is unable to walk more than 200 feet without needing to rest - she has handicap parking  sticker  Esophageal stricture. - she is followed by Dr. Silverio Decamp with [facility] - there are no pulmonary contra-indications to having esophageal dilation if needed  Time Spent Involved in Patient Care on Day of Examination:  22 minutes  Follow up:  Patient Instructions  Follow up in 6 months   Medication List:   Allergies as of 05/22/2020      Reactions   Codeine Nausea And Vomiting   Droperidol    Nervous & shaking   Meperidine Hcl Nausea And Vomiting   Pb-hyoscy-atropine-scopolamine    Sulfasalazine Other (See Comments)   REACTION: nausea   Sulfonamide Derivatives    REACTION: nausea   Barbiturates Rash   Phenobarbital Rash      Medication List       Accurate as of May 22, 2020 10:25 AM. If you have any questions, ask your nurse or doctor.        azithromycin 500 MG tablet Commonly known as: ZITHROMAX TAKE 1 TABLET BY MOUTH EVERY DAY   cholecalciferol 1000 units tablet Commonly known as: VITAMIN D Take 50,000 Units by mouth. Reported on 08/02/2015   cyanocobalamin 1000 MCG tablet Take 500 mcg by mouth daily.   denosumab 60 MG/ML Sosy injection Commonly known as: PROLIA Prolia   ethambutol 400 MG tablet Commonly known as: MYAMBUTOL TAKE 2 TABLETS (800 MG TOTAL) BY MOUTH DAILY.   fexofenadine 180 MG tablet Commonly known as: ALLEGRA Take 180 mg by mouth daily as needed for allergies. Reported on 04/14/2015   Flutter Devi Use as directed   guaiFENesin 600 MG 12 hr tablet Commonly known as: MUCINEX Take 600 mg by mouth 2 (two) times daily. Reported on 08/02/2015   lansoprazole 30 MG disintegrating tablet Commonly known as: PREVACID SOLUTAB TAKE 1 TABLET (30 MG TOTAL) BY MOUTH 2 (TWO) TIMES DAILY BEFORE A MEAL.   promethazine 25 MG tablet Commonly known as: PHENERGAN Take 25 mg by mouth every 6 (six) hours as needed for nausea or vomiting.   propranolol 10 MG tablet Commonly known as: INDERAL Take 10 mg by mouth daily.    rifampin 300 MG capsule Commonly known as: RIFADIN TAKE 2 CAPSULES (600 MG TOTAL) BY MOUTH DAILY.   Stiolto Respimat 2.5-2.5 MCG/ACT Aers Generic drug: Tiotropium Bromide-Olodaterol Inhale 2 puffs into the lungs daily.   sucralfate 1 g tablet Commonly known as: CARAFATE TAKE 1 TABLET (1 G TOTAL) BY MOUTH 2 (TWO) TIMES DAILY.   SUMAtriptan 100 MG tablet Commonly known as: IMITREX Take 100 mg by mouth as needed. Reported on 04/14/2015   topiramate 200 MG tablet Commonly known as: TOPAMAX Take 200 mg by mouth daily. Reported on 04/14/2015       Signature:  Chesley Mires, MD [place] Pager - [phone] 05/22/2020, 10:25 AM

## 2020-05-26 ENCOUNTER — Other Ambulatory Visit (HOSPITAL_COMMUNITY): Payer: Self-pay | Admitting: *Deleted

## 2020-05-29 ENCOUNTER — Ambulatory Visit (HOSPITAL_COMMUNITY)
Admission: RE | Admit: 2020-05-29 | Discharge: 2020-05-29 | Disposition: A | Discharge status: Home / Self Care | Payer: Medicare PPO | Source: Ambulatory Visit | Attending: Internal Medicine | Admitting: Internal Medicine

## 2020-05-29 ENCOUNTER — Other Ambulatory Visit: Payer: Self-pay

## 2020-05-29 DIAGNOSIS — M810 Age-related osteoporosis without current pathological fracture: Secondary | ICD-10-CM | POA: Insufficient documentation

## 2020-05-29 MED ORDER — DENOSUMAB 60 MG/ML ~~LOC~~ SOSY: 60.0000 mg | PREFILLED_SYRINGE | Freq: Once | SUBCUTANEOUS | Status: DC

## 2020-05-29 MED ORDER — DENOSUMAB 60 MG/ML ~~LOC~~ SOSY
PREFILLED_SYRINGE | SUBCUTANEOUS | Status: AC
  Administered 2020-05-29: 60 mg
  Filled 2020-05-29: qty 1

## 2020-08-30 DIAGNOSIS — R519 Headache, unspecified: Secondary | ICD-10-CM | POA: Diagnosis not present

## 2020-08-30 DIAGNOSIS — G43719 Chronic migraine without aura, intractable, without status migrainosus: Secondary | ICD-10-CM | POA: Diagnosis not present

## 2020-08-30 DIAGNOSIS — G43019 Migraine without aura, intractable, without status migrainosus: Secondary | ICD-10-CM | POA: Diagnosis not present

## 2020-11-08 DIAGNOSIS — Z1231 Encounter for screening mammogram for malignant neoplasm of breast: Secondary | ICD-10-CM | POA: Diagnosis not present

## 2020-11-14 ENCOUNTER — Ambulatory Visit: Payer: Medicare PPO | Admitting: Infectious Disease

## 2020-11-14 ENCOUNTER — Other Ambulatory Visit: Payer: Self-pay

## 2020-11-14 ENCOUNTER — Encounter: Payer: Self-pay | Admitting: Infectious Disease

## 2020-11-14 ENCOUNTER — Ambulatory Visit: Payer: Medicare PPO

## 2020-11-14 VITALS — BP 121/76 | HR 89 | Temp 97.5°F | Wt 117.0 lb

## 2020-11-14 DIAGNOSIS — R5383 Other fatigue: Secondary | ICD-10-CM

## 2020-11-14 DIAGNOSIS — J449 Chronic obstructive pulmonary disease, unspecified: Secondary | ICD-10-CM | POA: Diagnosis not present

## 2020-11-14 DIAGNOSIS — R911 Solitary pulmonary nodule: Secondary | ICD-10-CM

## 2020-11-14 DIAGNOSIS — Z23 Encounter for immunization: Secondary | ICD-10-CM

## 2020-11-14 DIAGNOSIS — A310 Pulmonary mycobacterial infection: Secondary | ICD-10-CM | POA: Diagnosis not present

## 2020-11-14 HISTORY — DX: Solitary pulmonary nodule: R91.1

## 2020-11-14 MED ORDER — RIFAMPIN 300 MG PO CAPS: 600.0000 mg | ORAL_CAPSULE | Freq: Every day | ORAL | 11 refills | Status: DC

## 2020-11-14 MED ORDER — ETHAMBUTOL HCL 400 MG PO TABS: 800.0000 mg | ORAL_TABLET | Freq: Every day | ORAL | 11 refills | Status: DC

## 2020-11-14 MED ORDER — AZITHROMYCIN 500 MG PO TABS: 500.0000 mg | ORAL_TABLET | Freq: Every day | ORAL | 11 refills | Status: DC

## 2020-11-14 NOTE — Progress Notes (Signed)
   Covid-19 Vaccination Clinic  Name:  BIBIANA GILLEAN    MRN: [id number] DOB: [birth year]  11/14/2020  Ms. Norgard was observed post Covid-19 immunization for 15 minutes without incident. She was provided with Vaccine Information Sheet and instruction to access the V-Safe system.   Ms. Dimaio was instructed to call 911 with any severe reactions post vaccine: Difficulty breathing  Swelling of face and throat  A fast heartbeat  A bad rash all over body  Dizziness and weakness  Leatrice Jewels, RMA

## 2020-11-14 NOTE — Progress Notes (Signed)
Subjective:  Chief complaint fatigue that gets worse throughout the day  Patient ID: [name], female    DOB: 23-Sep-1940, 80 y.o.   MRN: [id number]    HPI  39  old with nodular, bronchiectatic M avium infection who has been tolerating 3 drug rx for M avium.   She tells me that she does cough daily but without production of sputum she does not have weight loss or fevers or chills or malaise.  She is followed by GI for an esophageal stricture.  She saw Dr. Halford Chessman recently and it sounds as if she had some component of her breathing was that was responsivebeta agonist therapy.  Over when she went to fill prescription there was concern by pharmacy about interaction with her beta-blocker.  She would prefer to stay on her 3 drug regimen because in the past whenever she has stopped treatment for Mycobacterium avium it is always recurred and she has had a harder time reinitiating therapy when she has been off the drugs versus once she is been on them for a while.  She did have an area on the left lower lobe that was larger on imaging on CT scan in December. There was some anxiety that this could represent neoplasm and might require a biopsy.  On repeat scan on April 27, 2020 which have personally reviewed this area is slightly reduced in size.     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/06/2015   Low serum vitamin D 03/06/2015   Lumbar disc herniation    Malaise 11/18/2017   Migraine    Nausea 07/16/2017   Osteopenia    Osteoporosis    EARLY STAGES   Ovarian cyst     Plantar fasciitis    Pneumonia    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[place])    2006,2009,2015   Vaccine counseling 11/09/2019   Vitamin B12 deficiency    Weight loss 11/30/2014    Past Surgical History:  Procedure Laterality Date   ABDOMINAL HYSTERECTOMY     BREAST LUMPECTOMY Right    CATARACT EXTRACTION Right    COLONOSCOPY     LAPAROSCOPIC CHOLECYSTECTOMY     MASTECTOMY PARTIAL / LUMPECTOMY     MASTOIDECTOMY     MYRINGOTOMY     with tympanoplasty and mastoidectomy   NOSE SURGERY     TONSILLECTOMY     TOTAL VAGINAL HYSTERECTOMY     UPPER GASTROINTESTINAL ENDOSCOPY      Family History  Problem Relation Age of Onset   Bone cancer Father    Heart disease Father 9       CAD-CABG   Leukemia Mother    Parkinsonism Mother    Heart attack Paternal Uncle    Colon cancer Neg Hx    Stomach cancer Neg Hx    Esophageal cancer Neg Hx    Rectal cancer Neg Hx       Social History   Socioeconomic History   Marital  status: Widowed    Spouse name: Not on file   Number of children: 2   Years of education: Not on file   Highest education level: Not on file  Occupational History   Occupation: Retired  Tobacco Use   Smoking status: Never   Smokeless tobacco: Never  Vaping Use   Vaping Use: Never used  Substance and Sexual Activity   Alcohol use: No    Alcohol/week: 0.0 standard drinks   Drug use: No   Sexual activity: Not Currently  Other Topics Concern   Not on file  Social History Narrative   Daily caffeine    Social Determinants of Health   Financial Resource Strain: Not on file  Food Insecurity: Not on file  Transportation Needs: Not on file  Physical Activity: Not on file  Stress: Not on file  Social Connections: Not on file    Allergies  Allergen Reactions   Codeine Nausea And Vomiting   Droperidol     Nervous & shaking   Meperidine Hcl Nausea And Vomiting   Pb-Hyoscy-Atropine-Scopolamine    Sulfasalazine Other (See Comments)     REACTION: nausea   Sulfonamide Derivatives     REACTION: nausea   Barbiturates Rash   Phenobarbital Rash     Current Outpatient Medications:    azithromycin (ZITHROMAX) 500 MG tablet, TAKE 1 TABLET BY MOUTH EVERY DAY, Disp: 30 tablet, Rfl: 11   cholecalciferol (VITAMIN D) 1000 units tablet, Take 50,000 Units by mouth. Reported on 08/02/2015, Disp: , Rfl:    cyanocobalamin 1000 MCG tablet, Take 500 mcg by mouth daily. , Disp: , Rfl:    denosumab (PROLIA) 60 MG/ML SOSY injection, Prolia, Disp: , Rfl:    ethambutol (MYAMBUTOL) 400 MG tablet, TAKE 2 TABLETS (800 MG TOTAL) BY MOUTH DAILY., Disp: 60 tablet, Rfl: 11   fexofenadine (ALLEGRA) 180 MG tablet, Take 180 mg by mouth daily as needed for allergies. Reported on 04/14/2015, Disp: , Rfl:    guaiFENesin (MUCINEX) 600 MG 12 hr tablet, Take 600 mg by mouth 2 (two) times daily. Reported on 08/02/2015, Disp: , Rfl:    lansoprazole (PREVACID SOLUTAB) 30 MG disintegrating tablet, TAKE 1 TABLET (30 MG TOTAL) BY MOUTH 2 (TWO) TIMES DAILY BEFORE A MEAL., Disp: 60 tablet, Rfl: 11   promethazine (PHENERGAN) 25 MG tablet, Take 25 mg by mouth every 6 (six) hours as needed for nausea or vomiting., Disp: , Rfl:    propranolol (INDERAL) 10 MG tablet, Take 10 mg by mouth daily., Disp: , Rfl:    Respiratory Therapy Supplies (FLUTTER) DEVI, Use as directed, Disp: 1 each, Rfl: 0   rifampin (RIFADIN) 300 MG capsule, TAKE 2 CAPSULES (600 MG TOTAL) BY MOUTH DAILY., Disp: 60 capsule, Rfl: 11   sucralfate (CARAFATE) 1 g tablet, TAKE 1 TABLET (1 G TOTAL) BY MOUTH 2 (TWO) TIMES DAILY., Disp: 180 tablet, Rfl: 3   SUMAtriptan (IMITREX) 100 MG tablet, Take 100 mg by mouth as needed. Reported on 04/14/2015, Disp: , Rfl:    topiramate (TOPAMAX) 200 MG tablet, Take 200 mg by mouth daily. Reported on 04/14/2015, Disp: , Rfl:   Current Facility-Administered Medications:    0.9 %  sodium chloride infusion, 500 mL, Intravenous, Continuous, Nandigam, Kavitha V, MD       Review  of Systems  Constitutional:  Positive for fatigue. Negative for activity change, appetite change, chills, diaphoresis, fever and unexpected weight change.  HENT:  Negative for congestion, rhinorrhea, sinus pressure, sneezing, sore throat and trouble swallowing.  Eyes:  Negative for photophobia and visual disturbance.  Respiratory:  Positive for cough. Negative for chest tightness, shortness of breath, wheezing and stridor.   Cardiovascular:  Negative for chest pain, palpitations and leg swelling.  Gastrointestinal:  Negative for abdominal distention, abdominal pain, anal bleeding, blood in stool, constipation, diarrhea, nausea and vomiting.  Genitourinary:  Negative for difficulty urinating, dysuria, flank pain and hematuria.  Musculoskeletal:  Negative for arthralgias, back pain, gait problem, joint swelling and myalgias.  Skin:  Negative for color change, pallor, rash and wound.  Neurological:  Negative for dizziness, tremors, weakness and light-headedness.  Hematological:  Negative for adenopathy. Does not bruise/bleed easily.  Psychiatric/Behavioral:  Negative for agitation, behavioral problems, confusion, decreased concentration, dysphoric mood and sleep disturbance.       Objective:   Physical Exam Constitutional:      General: She is not in acute distress.    Appearance: Normal appearance. She is well-developed. She is not ill-appearing or diaphoretic.  HENT:     Head: Normocephalic and atraumatic.     Right Ear: Hearing and external ear normal.     Left Ear: Hearing and external ear normal.     Nose: No nasal deformity or rhinorrhea.  Eyes:     General: No scleral icterus.    Conjunctiva/sclera: Conjunctivae normal.     Right eye: Right conjunctiva is not injected.     Left eye: Left conjunctiva is not injected.     Pupils: Pupils are equal, round, and reactive to light.  Neck:     Vascular: No JVD.  Cardiovascular:     Rate and Rhythm: Normal rate and regular rhythm.      Heart sounds: Normal heart sounds, S1 normal and S2 normal. No murmur heard.   No friction rub.  Pulmonary:     Effort: Prolonged expiration present. No respiratory distress.     Breath sounds: Normal breath sounds and air entry. No stridor. No wheezing, rhonchi or rales.  Chest:     Chest wall: No tenderness.  Abdominal:     General: Bowel sounds are normal. There is no distension.     Palpations: Abdomen is soft.     Tenderness: There is no abdominal tenderness.  Musculoskeletal:        General: Normal range of motion.     Right shoulder: Normal.     Left shoulder: Normal.     Cervical back: Normal range of motion and neck supple.     Right hip: Normal.     Left hip: Normal.     Right knee: Normal.     Left knee: Normal.  Lymphadenopathy:     Head:     Right side of head: No submandibular, preauricular or posterior auricular adenopathy.     Left side of head: No submandibular, preauricular or posterior auricular adenopathy.     Cervical: No cervical adenopathy.     Right cervical: No superficial or deep cervical adenopathy.    Left cervical: No superficial or deep cervical adenopathy.  Skin:    General: Skin is warm and dry.     Coloration: Skin is not pale.     Findings: No abrasion, bruising, ecchymosis, erythema, lesion or rash.     Nails: There is no clubbing.  Neurological:     General: No focal deficit present.     Mental Status: She is alert and oriented to person, place, and time.     Sensory: No sensory deficit.     Coordination:  Coordination normal.     Gait: Gait normal.  Psychiatric:        Attention and Perception: She is attentive.        Mood and Affect: Mood normal.        Speech: Speech normal.        Behavior: Behavior normal. Behavior is cooperative.        Thought Content: Thought content normal.        Judgment: Judgment normal.          Assessment & Plan:   Mycobacterium AVM:  I will continue her prescription for ethambutol azithromycin  and rifampin  As mentioned whenever she is come off the medication she is invariably had relapse within a year and she dislikes having to try to get used to taking these medications.  I will check a CMP as well as a CBC with differential  Fatigue: I will check a TSH and free T4 and also I am rechecking her CBC  Lung nodule: Repeat CT I have reviewed personally from April 27, 2020 and it shows the nodule is diminished and signed consistence with it likely being due to an AVM and clearly not a malignancy  Vaccine counseling: We gave her COVID-19 updated vaccine today and she will get a high-dose flu vaccine at her pharmacy.

## 2020-11-15 LAB — COMPLETE METABOLIC PANEL WITH GFR
AG Ratio: 2.1 (calc) (ref 1.0–2.5)
ALT: 12 U/L (ref 6–29)
AST: 15 U/L (ref 10–35)
Albumin: 4.6 g/dL (ref 3.6–5.1)
Alkaline phosphatase (APISO): 50 U/L (ref 37–153)
BUN: 8 mg/dL (ref 7–25)
CO2: 25 mmol/L (ref 20–32)
Calcium: 9.7 mg/dL (ref 8.6–10.4)
Chloride: 106 mmol/L (ref 98–110)
Creat: 0.69 mg/dL (ref 0.60–0.95)
Globulin: 2.2 g/dL (calc) (ref 1.9–3.7)
Glucose, Bld: 97 mg/dL (ref 65–99)
Potassium: 3.9 mmol/L (ref 3.5–5.3)
Sodium: 141 mmol/L (ref 135–146)
Total Bilirubin: 1.1 mg/dL (ref 0.2–1.2)
Total Protein: 6.8 g/dL (ref 6.1–8.1)
eGFR: 88 mL/min/{1.73_m2} (ref >=60)

## 2020-11-15 LAB — CBC WITH DIFFERENTIAL/PLATELET
Absolute Monocytes: 336 cells/uL (ref 200–950)
Basophils Absolute: 41 cells/uL (ref 0–200)
Basophils Relative: 0.7 %
Eosinophils Absolute: 87 cells/uL (ref 15–500)
Eosinophils Relative: 1.5 %
HCT: 42.4 % (ref 35.0–45.0)
Hemoglobin: 14.1 g/dL (ref 11.7–15.5)
Lymphs Abs: 528 cells/uL — ABNORMAL LOW (ref 850–3900)
MCH: 32.6 pg (ref 27.0–33.0)
MCHC: 33.3 g/dL (ref 32.0–36.0)
MCV: 97.9 fL (ref 80.0–100.0)
MPV: 11 fL (ref 7.5–12.5)
Monocytes Relative: 5.8 %
Neutro Abs: 4808 cells/uL (ref 1500–7800)
Neutrophils Relative %: 82.9 %
Platelets: 191 10*3/uL (ref 140–400)
RBC: 4.33 10*6/uL (ref 3.80–5.10)
RDW: 12.2 % (ref 11.0–15.0)
Total Lymphocyte: 9.1 %
WBC: 5.8 10*3/uL (ref 3.8–10.8)

## 2020-11-15 LAB — TSH+FREE T4: TSH W/REFLEX TO FT4: 2.48 mIU/L (ref 0.40–4.50)

## 2020-11-16 ENCOUNTER — Encounter: Payer: Self-pay | Admitting: Gastroenterology

## 2020-11-24 ENCOUNTER — Telehealth: Payer: Self-pay

## 2020-11-24 NOTE — Telephone Encounter (Signed)
Patient called to review lab results from 9/27. Notified her that nothing came back out of the ordinary when compared to previous lab work and that thyroid labs were normal.    Sent patient link to assist her in setting up [place].   Beryle Flock, RN

## 2020-11-28 ENCOUNTER — Other Ambulatory Visit (HOSPITAL_COMMUNITY): Payer: Self-pay | Admitting: *Deleted

## 2020-11-29 ENCOUNTER — Other Ambulatory Visit: Payer: Self-pay

## 2020-11-29 ENCOUNTER — Ambulatory Visit (HOSPITAL_COMMUNITY)
Admission: RE | Admit: 2020-11-29 | Discharge: 2020-11-29 | Disposition: A | Discharge status: Home / Self Care | Payer: Medicare PPO | Source: Ambulatory Visit | Attending: Internal Medicine | Admitting: Internal Medicine

## 2020-11-29 DIAGNOSIS — M810 Age-related osteoporosis without current pathological fracture: Secondary | ICD-10-CM | POA: Diagnosis not present

## 2020-11-29 MED ORDER — DENOSUMAB 60 MG/ML ~~LOC~~ SOSY
60.0000 mg | PREFILLED_SYRINGE | Freq: Once | SUBCUTANEOUS | Status: AC
  Administered 2020-11-29: 60 mg via SUBCUTANEOUS

## 2020-11-29 MED ORDER — DENOSUMAB 60 MG/ML ~~LOC~~ SOSY
PREFILLED_SYRINGE | SUBCUTANEOUS | Status: AC
  Filled 2020-11-29: qty 1

## 2020-12-22 ENCOUNTER — Ambulatory Visit (INDEPENDENT_AMBULATORY_CARE_PROVIDER_SITE_OTHER): Payer: Medicare PPO

## 2020-12-22 ENCOUNTER — Other Ambulatory Visit: Payer: Self-pay

## 2020-12-22 DIAGNOSIS — Z23 Encounter for immunization: Secondary | ICD-10-CM

## 2021-01-17 ENCOUNTER — Ambulatory Visit: Payer: Medicare PPO | Admitting: Pulmonary Disease

## 2021-01-17 ENCOUNTER — Other Ambulatory Visit: Payer: Self-pay

## 2021-01-17 ENCOUNTER — Encounter: Payer: Self-pay | Admitting: Pulmonary Disease

## 2021-01-17 VITALS — BP 110/70 | HR 95 | Temp 97.6°F | Ht 66.5 in | Wt 116.6 lb

## 2021-01-17 DIAGNOSIS — A310 Pulmonary mycobacterial infection: Secondary | ICD-10-CM | POA: Diagnosis not present

## 2021-01-17 DIAGNOSIS — J479 Bronchiectasis, uncomplicated: Secondary | ICD-10-CM | POA: Diagnosis not present

## 2021-01-17 DIAGNOSIS — R911 Solitary pulmonary nodule: Secondary | ICD-10-CM | POA: Diagnosis not present

## 2021-01-17 DIAGNOSIS — J449 Chronic obstructive pulmonary disease, unspecified: Secondary | ICD-10-CM

## 2021-01-17 DIAGNOSIS — R058 Other specified cough: Secondary | ICD-10-CM | POA: Diagnosis not present

## 2021-01-17 NOTE — Patient Instructions (Signed)
Try using salt water gargles Sip water when you have an urge to cough Use sugarless candy to keep your mouth moist Use 1 teaspoon of local honey once or twice per day to help with cough  Follow up in 4 months

## 2021-01-17 NOTE — Progress Notes (Signed)
[facility]  Chief Complaint  Patient presents with   Follow-up    Pt states she have been getting more out of breath and light headed    Constitutional:  BP 110/70 (BP Location: Right Arm, Patient Position: Sitting, Cuff Size: Normal)   Pulse 95   Temp 97.6 F (36.4 C) (Oral)   Ht 5' 6.5" (1.689 m)   Wt 116 lb 9.6 oz (52.9 kg)   SpO2 99%   BMI 18.54 kg/m   Past Medical History:  Rt breast cancer, Cataract, Diverticulosis, Esophageal stricture, GERD, Tremor, Vit D deficiency, Migraine HA, Osteoporosis, Plantar fasciitis, Vit B12 deficiency  Past Surgical History:  Her  has a past surgical history that includes Total vaginal hysterectomy; Myringotomy; Laparoscopic cholecystectomy; Cataract extraction (Right); Breast lumpectomy (Right); Tonsillectomy; Mastectomy partial / lumpectomy; Abdominal hysterectomy; Upper gastrointestinal endoscopy; Nose surgery; Mastoidectomy; and Colonoscopy.  Brief Summary:  [name] is a 80 y.o. 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      Subjective:   He has been getting sinus congestion and drainage.  Has to clear her throat frequently.  Sometimes coughs up clear phlegm.  Not having wheeze, fever, or hemoptysis.  Feels like her energy level is lower and gets tired more easily.  Physical Exam:   Appearance - well kempt   ENMT - no sinus tenderness, no oral exudate, no LAN, Mallampati 2 airway, no stridor, clear nasal drainage  Respiratory - equal breath sounds bilaterally, no wheezing or rales  CV - s1s2 regular rate and rhythm, no murmurs  Ext - no clubbing, no edema  Skin - no rashes  Psych - normal mood and affect    Pulmonary testing:  PFT 12/27/04 >> FEV1 2.34 (100%), FEV1% 56, TLC 6.04 (115%), DLCO 87% Bronchoscopy 02/04/07 >> Aspergillus species  PFT 05/21/12 >> FEV1 2.10 (98%), FEV1% 56%, TLC 4.83 (93%), DLCO 77% Sputum 09/15/14 >> MAI PFT 02/24/20 >> FEV1  1.64 (74%), FEV1% 54, TLC 7.95 (146%), DLCO 72%, +BD  Chest Imaging:  08/29/06 >> 6 mm nodule lingula, 2 mm nodule LLL, BTX in lingula and RML,  01/17/07 >> no change 02/16/10 >> BTX RML/lingula/RLL/LLL 06/01/12 >> progression of tree in bud pattern 09/06/14 >> BTX, increased nodularity, 2 cm nodule RLL, 1.3 cm nodule LLL 08/09/15 >> decreased to resolved nodules, no change in BTX CT angio chest 06/27/17 >> BTX most on left, scattered nodularity HRCT chest 01/25/20 >>  Extensive moderate cylindrical and varicoid BTX most in upper/mid zones, medial LLL nodule now 1.5 x 1.1 cm (was 1.1 x 0.5 cm in May 2019) HRCT chest 04/28/20 >> stable to decreased size of nodules, changes of BTX  Cardiac Tests:  Echo 08/18/17 >> EF 45 to 50%, grade 1 DD, mild MR  Social History:  She  reports that she has never smoked. She has never used smokeless tobacco. She reports that she does not drink alcohol and does not use drugs.  Family History:  Her family history includes Bone cancer in her father; Heart attack in her paternal uncle; Heart disease (age of onset: 58) in her father; Leukemia in her mother; Parkinsonism in her mother.      Assessment/Plan:   Bronchiectasis in setting of MAI. - followed by Dr. 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 - no response to previous trials of inhaler therapy with ICS/LAMA/LABA  Upper airway cough syndrome. - reluctant to use nasal steroids in setting of MAI - advised her to avoid clearing her throat or forcing a cough - she can use salt water gargles - sugarless candy to keep her mouth moist and sip water when she has an urge to cough - teaspoon of honey prn as antitussive agent  Dyspnea on exertion. - she is unable to walk more  than 200 feet without needing to rest - she has handicap parking sticker   Esophageal stricture. - she is followed by Dr. Silverio Decamp with [facility]  Time Spent Involved in Patient Care on Day of Examination:  32 minutes  Follow up:   Patient Instructions  Try using salt water gargles Sip water when you have an urge to cough Use sugarless candy to keep your mouth moist Use 1 teaspoon of local honey once or twice per day to help with cough  Follow up in 4 months  Medication List:   Allergies as of 01/17/2021       Reactions   Codeine Nausea And Vomiting   Droperidol    Nervous & shaking   Meperidine Hcl Nausea And Vomiting   Pb-hyoscy-atropine-scopolamine    Sulfasalazine Other (See Comments)   REACTION: nausea   Sulfonamide Derivatives    REACTION: nausea   Barbiturates Rash   Phenobarbital Rash        Medication List        Accurate as of January 17, 2021  2:03 PM. If you have any questions, ask your nurse or doctor.          azithromycin 500 MG tablet Commonly known as: ZITHROMAX Take 1 tablet (500 mg total) by mouth daily.   cholecalciferol 1000 units tablet Commonly known as: VITAMIN D Take 50,000 Units by mouth. Reported on 08/02/2015   cyanocobalamin 1000 MCG tablet Take 500 mcg by mouth daily.   denosumab 60 MG/ML Sosy injection Commonly known as: PROLIA Prolia   ethambutol 400 MG tablet Commonly known as: MYAMBUTOL Take 2 tablets (800 mg total) by mouth daily.   fexofenadine 180 MG tablet Commonly known as: ALLEGRA Take 180 mg by mouth daily as needed for allergies. Reported on 04/14/2015   Flutter Devi Use as directed   guaiFENesin 600 MG 12 hr tablet Commonly known as: MUCINEX Take 600 mg by mouth 2 (two) times daily. Reported on 08/02/2015   lansoprazole 30 MG disintegrating tablet Commonly known as: PREVACID SOLUTAB TAKE 1 TABLET (30 MG TOTAL) BY MOUTH 2 (TWO) TIMES DAILY BEFORE A MEAL.   promethazine 25 MG  tablet Commonly known as: PHENERGAN Take 25 mg by mouth every 6 (six) hours as needed for nausea or vomiting.   propranolol 10 MG tablet Commonly known as: INDERAL Take 10 mg by mouth daily.   rifampin 300 MG capsule Commonly known as: RIFADIN Take 2 capsules (600 mg total) by mouth daily.   sucralfate 1 g tablet Commonly known as: CARAFATE TAKE 1 TABLET (1 G TOTAL) BY MOUTH 2 (TWO) TIMES DAILY.   SUMAtriptan 100 MG tablet Commonly known as: IMITREX Take 100 mg by mouth as needed. Reported on 04/14/2015   topiramate 200 MG tablet Commonly known as: TOPAMAX Take 200 mg by mouth daily. Reported on 04/14/2015        Signature:  Chesley Mires, MD [place] Pager - [phone] 01/17/2021, 2:03 PM

## 2021-02-22 DIAGNOSIS — G43719 Chronic migraine without aura, intractable, without status migrainosus: Secondary | ICD-10-CM | POA: Diagnosis not present

## 2021-04-18 ENCOUNTER — Ambulatory Visit: Payer: Medicare PPO | Admitting: Adult Health

## 2021-04-18 ENCOUNTER — Encounter: Payer: Self-pay | Admitting: Adult Health

## 2021-04-18 ENCOUNTER — Other Ambulatory Visit: Payer: Self-pay

## 2021-04-18 ENCOUNTER — Ambulatory Visit (INDEPENDENT_AMBULATORY_CARE_PROVIDER_SITE_OTHER): Payer: Medicare PPO

## 2021-04-18 VITALS — BP 110/68 | HR 78 | Temp 97.9°F | Ht 66.5 in | Wt 110.0 lb

## 2021-04-18 DIAGNOSIS — A310 Pulmonary mycobacterial infection: Secondary | ICD-10-CM

## 2021-04-18 DIAGNOSIS — E441 Mild protein-calorie malnutrition: Secondary | ICD-10-CM

## 2021-04-18 DIAGNOSIS — J479 Bronchiectasis, uncomplicated: Secondary | ICD-10-CM

## 2021-04-18 NOTE — Patient Instructions (Signed)
Restart Flutter valve Twice daily   ?Mucinex Twice daily  As needed   ?Delsym 2 tsp Twice daily  As needed cough .  ?Saline nasal spray Twice daily   ?Saline nasal gel At bedtime   ?Activity as tolerated ?High protein diet  ?Chest xray today .  ?Follow up with Dr. Halford Chessman in 3 months and As needed   ?Follow up with ID this month as planned.  ?

## 2021-04-18 NOTE — Progress Notes (Signed)
UA from Dr.  Kathrynn Speed  ID: [name], female    DOB: [birth year], 81 y.o.   MRN: [id number]  Chief Complaint  Patient presents with   Follow-up    Referring provider: Donnajean Lopes, MD  HPI: 81 year old female never smoker followed for bronchiectasis with recurrent MAI   TEST/EVENTS :  PFT 12/27/04 >> FEV1 2.34 (100%), FEV1% 56, TLC 6.04 (115%), DLCO 87% Bronchoscopy 02/04/07 >> Aspergillus species  PFT 05/21/12 >> FEV1 2.10 (98%), FEV1% 56%, TLC 4.83 (93%), DLCO 77% Sputum 09/15/14 >> MAI PFT 02/24/20 >> FEV1 1.64 (74%), FEV1% 54, TLC 7.95 (146%), DLCO 72%, +BD   Chest Imaging:  08/29/06 >> 6 mm nodule lingula, 2 mm nodule LLL, BTX in lingula and RML,  01/17/07 >> no change 02/16/10 >> BTX RML/lingula/RLL/LLL 06/01/12 >> progression of tree in bud pattern 09/06/14 >> BTX, increased nodularity, 2 cm nodule RLL, 1.3 cm nodule LLL 08/09/15 >> decreased to resolved nodules, no change in BTX CT angio chest 06/27/17 >> BTX most on left, scattered nodularity HRCT chest 01/25/20 >>  Extensive moderate cylindrical and varicoid BTX most in upper/mid zones, medial LLL nodule now 1.5 x 1.1 cm (was 1.1 x 0.5 cm in May 2019) HRCT chest 04/28/20 >> stable to decreased size of nodules, changes of BTX   Cardiac Tests:  Echo 08/18/17 >> EF 45 to 50%, grade 1 DD, mild MR  04/18/2021 Follow up : Bronchiectasis, MAI  Patient presents for a 68-monthfollow-up.  Patient has underlying bronchiectasis with recurrent MAI.  She is followed at the ID clinic.  She is on triple drug therapy.  She says she has been on this for greater than 4 years this treatment regimen.  Patient says she seems to be tolerating but has daytime fatigue low energy and decreased appetite.  Weight is slowly trending down about 10 pounds over the last year.  Patient says she remains active.  She has a lot of family stress.  She tries to help her daughter and granddaughter quite a bit.  She is fully independent.  Able to do all of  her ADLs.  Does her cooking and shopping.  She has a daily cough that is mainly productive sometimes she says it is very thick mucus.  She is not using her flutter valve.  She has tried inhalers in the past with those perceived clinical benefit. She denies any hemoptysis, chest pain, orthopnea or edema    Allergies  Allergen Reactions   Codeine Nausea And Vomiting   Droperidol Other (See Comments)    Nervous & shaking   Barbiturates Rash   Meperidine Hcl Nausea And Vomiting   Pb-Hyoscy-Atropine-Scopolamine Other (See Comments)   Phenobarbital Rash   Sulfasalazine Other (See Comments)    REACTION: nausea   Sulfonamide Derivatives Nausea Only    REACTION: nausea    Immunization History  Administered Date(s) Administered   Fluad Quad(high Dose 65+) 11/16/2018, 01/17/2020, 12/22/2020   Influenza Split 11/29/2009, 12/05/2010, 11/19/2011, 12/13/2011, 12/18/2012, 01/05/2014, 11/30/2014, 12/08/2015   Influenza Whole 11/20/2004, 12/07/2009   Influenza, High Dose Seasonal PF 12/08/2015, 12/15/2016   Influenza,inj,Quad PF,6+ Mos 11/18/2013, 01/05/2014, 11/30/2014, 11/18/2017   PFIZER(Purple Top)SARS-COV-2 Vaccination 03/05/2019, 03/26/2019, 11/26/2019   Pfizer Covid-19 Vaccine Bivalent Booster 131yr& up 11/14/2020   Pneumococcal Conjugate-13 08/29/2014   Pneumococcal Polysaccharide-23 11/29/2009   Zoster, Live 12/18/2012    Past Medical History:  Diagnosis Date   Allergy    SEASONAL   Anemia    Bre[place] cancer, right [place]  Bronchiectasis ([place])    Bronchiectasis ([place]) 11/30/2014   Cataract    bil removed - with lense implant   Dilation of biliary tract    Diverticulosis    Dysphagia 11/18/2017   Dyspnea    with exertion    Esophageal stricture 11/09/2019   Fatigue 07/16/2017   GERD (gastroesophageal reflux disease)    History of radiation therapy    Intention tremor 03/06/2015   Low serum vitamin D 03/06/2015   Lumbar disc herniation    Lung nodule 11/14/2020   Malaise  11/18/2017   Migraine    Nausea 07/16/2017   Osteopenia    Osteoporosis    EARLY STAGES   Ovarian cyst    Plantar fasciitis    Pneumonia    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 30 tablet 11   cholecalciferol (VITAMIN D) 1000 units tablet Take 50,000 Units by mouth. Reported on 08/02/2015     cyanocobalamin 1000 MCG tablet Take 500 mcg by mouth daily.      denosumab (PROLIA) 60 MG/ML SOSY injection Prolia     ethambutol (MYAMBUTOL) 400 MG tablet Take 2 tablets (800 mg total) by mouth daily. 60 tablet 11   fexofenadine (ALLEGRA) 180 MG tablet Take 180 mg by mouth daily as needed for allergies. Reported on 04/14/2015     guaiFENesin (MUCINEX) 600 MG 12 hr tablet Take 600 mg by mouth 2 (two) times daily. Reported on 08/02/2015     lansoprazole (PREVACID SOLUTAB) 30 MG disintegrating tablet TAKE 1 TABLET (30 MG TOTAL) BY MOUTH 2 (TWO) TIMES DAILY BEFORE A MEAL. 60 tablet 11   promethazine (PHENERGAN) 25 MG tablet Take 25 mg by mouth every 6 (six) hours as needed for nausea or vomiting.     propranolol (INDERAL) 10 MG tablet Take 10 mg by mouth daily.     Respiratory Therapy Supplies (FLUTTER) DEVI Use as directed 1 each 0   rifampin (RIFADIN) 300 MG capsule Take 2 capsules (600 mg total) by mouth daily. 60 capsule 11   sucralfate (CARAFATE) 1 g tablet TAKE 1 TABLET (1 G TOTAL) BY MOUTH 2 (TWO) TIMES DAILY. 180 tablet 3   SUMAtriptan (IMITREX) 100 MG tablet Take 100 mg by mouth as needed. Reported on 04/14/2015     topiramate (TOPAMAX) 200 MG tablet Take 200 mg by mouth daily.  Reported on 04/14/2015     Facility-Administered Medications Prior to Visit  Medication Dose Route Frequency Provider Last Rate Last Admin   0.9 %  sodium chloride infusion  500 mL Intravenous Continuous Nandigam, Kavitha V, MD         Review of Systems:   Constitutional:   No , night sweats,  Fevers, chills,  +fatigue, or  lassitude.  HEENT:   No headaches,  Difficulty swallowing,  Tooth/dental problems, or  Sore throat,                No sneezing, itching, ear ache, nasal congestion, post nasal drip,   CV:  No chest pain,  Orthopnea, PND, swelling in lower extremities, anasarca, dizziness, palpitations,  syncope.   GI  No heartburn, indigestion, abdominal pain, nausea, vomiting, diarrhea, change in bowel habits, loss of appetite, bloody stools.   Resp:   No chest wall deformity  Skin: no rash or lesions.  GU: no dysuria, change in color of urine, no urgency or frequency.  No flank pain, no hematuria   MS:  No joint pain or swelling.  No decreased range of motion.  No back pain.    Physical Exam  BP 110/68 (BP Location: Left Arm, Patient Position: Sitting, Cuff Size: Normal)    Pulse 78    Temp 97.9 F (36.6 C) (Oral)    Ht 5' 6.5" (1.689 m)    Wt 110 lb (49.9 kg)    SpO2 98%    BMI 17.49 kg/m   GEN: A/Ox3; pleasant , NAD, well nourished    HEENT:  [place]/AT,  NOSE-clear, THROAT-clear, no lesions, no postnasal drip or exudate noted.   NECK:  Supple w/ fair ROM; no JVD; normal carotid impulses w/o bruits; no thyromegaly or nodules palpated; no lymphadenopathy.    RESP few trace rhonchi no accessory muscle use, no dullness to percussion  CARD:  RRR, no m/r/g, no peripheral edema, pulses intact, no cyanosis or clubbing.  GI:   Soft & nt; nml bowel sounds; no organomegaly or masses detected.   Musco: Warm bil, no deformities or joint swelling noted.   Neuro: alert, no focal deficits noted.    Skin: Warm, no lesions or rashes    Lab Results:     BNP No results  found for: BNP    Imaging: No results found.    PFT Results Latest Ref Rng & Units 02/24/2020  FVC-Pre L 2.68  FVC-Predicted Pre % 91  FVC-Post L 3.05  FVC-Predicted Post % 104  Pre FEV1/FVC % % 51  Post FEV1/FCV % % 54  FEV1-Pre L 1.36  FEV1-Predicted Pre % 62  FEV1-Post L 1.64  DLCO uncorrected ml/min/mmHg 14.84  DLCO UNC% % 72  DLCO corrected ml/min/mmHg 14.84  DLCO COR %Predicted % 72  DLVA Predicted % 88  TLC L 7.95  TLC % Predicted % 146  RV % Predicted % 205    No results found for: NITRICOXIDE      Assessment & Plan:   No problem-specific Assessment & Plan notes found for this encounter.     [name] Edison, NP 04/18/2021

## 2021-04-19 DIAGNOSIS — E441 Mild protein-calorie malnutrition: Secondary | ICD-10-CM | POA: Insufficient documentation

## 2021-04-19 NOTE — Assessment & Plan Note (Addendum)
Appears compensated.  Have encouraged patient on mucociliary clearance.  I have advised her to restart her flutter valve. ?Check chest x-ray today. ? ?Plan  ?Patient Instructions  ?Restart Flutter valve Twice daily   ?Mucinex Twice daily  As needed   ?Delsym 2 tsp Twice daily  As needed cough .  ?Saline nasal spray Twice daily   ?Saline nasal gel At bedtime   ?Activity as tolerated ?High protein diet  ?Chest xray today .  ?Follow up with Dr. Halford Chessman in 3 months and As needed   ?Follow up with ID this month as planned.  ?  ? ?

## 2021-04-19 NOTE — Assessment & Plan Note (Signed)
Chronic MAI.  Patient is continue follow-up with ID.  Says that she has been on triple drug therapy for greater than 4 years. ?Patient is continue on mucociliary clearance. ?Have advised her on a high-protein diet as weight continues to slowly trend down. ?

## 2021-04-19 NOTE — Assessment & Plan Note (Signed)
Encouraged on high-protein diet.  Ensure between meals. ?

## 2021-04-24 NOTE — Progress Notes (Signed)
Called and spoke with patient, advised of results/recommendations per Rexene Edison NP.  She sees ID at the end of the months.  She verbalized understanding.  Nothing further needed.

## 2021-05-16 ENCOUNTER — Other Ambulatory Visit: Payer: Self-pay

## 2021-05-16 ENCOUNTER — Ambulatory Visit: Payer: Medicare PPO | Admitting: Infectious Disease

## 2021-05-16 ENCOUNTER — Encounter: Payer: Self-pay | Admitting: Infectious Disease

## 2021-05-16 VITALS — BP 135/88 | HR 75 | Temp 97.9°F | Ht 66.0 in | Wt 111.0 lb

## 2021-05-16 DIAGNOSIS — A310 Pulmonary mycobacterial infection: Secondary | ICD-10-CM

## 2021-05-16 DIAGNOSIS — Z7185 Encounter for immunization safety counseling: Secondary | ICD-10-CM

## 2021-05-16 DIAGNOSIS — K222 Esophageal obstruction: Secondary | ICD-10-CM | POA: Diagnosis not present

## 2021-05-16 DIAGNOSIS — R911 Solitary pulmonary nodule: Secondary | ICD-10-CM | POA: Diagnosis not present

## 2021-05-16 DIAGNOSIS — G252 Other specified forms of tremor: Secondary | ICD-10-CM

## 2021-05-16 DIAGNOSIS — R634 Abnormal weight loss: Secondary | ICD-10-CM

## 2021-05-16 DIAGNOSIS — G43909 Migraine, unspecified, not intractable, without status migrainosus: Secondary | ICD-10-CM

## 2021-05-16 DIAGNOSIS — J449 Chronic obstructive pulmonary disease, unspecified: Secondary | ICD-10-CM

## 2021-05-16 DIAGNOSIS — R0602 Shortness of breath: Secondary | ICD-10-CM

## 2021-05-16 NOTE — Progress Notes (Signed)
? ?Subjective:  ?Chief complaints: Fatigue some weight loss as well as problems with the tremor  ? Patient ID: [name], female    DOB: [birth year], 81 y.o.   MRN: [id number] ? ? ? ?HPI ? ?52  old with nodular, bronchiectatic M avium infection who has been tolerating 3 drug rx for M avium for many many years. ? ?Will has several concerns today these include fatigue her tremors and also problems swallowing the azithromycin pill in the context of her esophageal stricture. ? ?Do not think that her tremors would be related to her antimycobacterial drugs at all.  Fatigue is a possibility but she also has other medications including her migraine prophylactic drugs that could be causing that. ? ?She still has some cough but not nearly like she has when her Mycobacterium avium has been untreated. ? ? ?Past Medical History:  ?Diagnosis Date  ? Allergy   ? SEASONAL  ? Anemia   ? Breast cancer, right ([place])   ? Bronchiectasis ([place])   ? Bronchiectasis ([place]) 11/30/2014  ? Cataract   ? bil removed - with lense implant  ? Dilation of biliary tract   ? Diverticulosis   ? Dysphagia 11/18/2017  ? Dyspnea   ? with exertion   ? Esophageal stricture 11/09/2019  ? Fatigue 07/16/2017  ? GERD (gastroesophageal reflux disease)   ? History of radiation therapy   ? Intention tremor 03/06/2015  ? Low serum vitamin D 03/06/2015  ? Lumbar disc herniation   ? Lung nodule 11/14/2020  ? Malaise 11/18/2017  ? Migraine   ? Nausea 07/16/2017  ? Osteopenia   ? Osteoporosis   ? EARLY STAGES  ? Ovarian cyst   ? Plantar fasciitis   ? Pneumonia   ? 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 2006,2009,2015  ? Vaccine counseling 11/09/2019  ? Vitamin B12 deficiency   ? Weight loss 11/30/2014  ? ? ?Past Surgical History:  ?Procedure Laterality Date  ? ABDOMINAL HYSTERECTOMY    ? BREAST LUMPECTOMY Right   ? CATARACT EXTRACTION Right   ? COLONOSCOPY    ? LAPAROSCOPIC CHOLECYSTECTOMY    ? MASTECTOMY PARTIAL / LUMPECTOMY    ? MASTOIDECTOMY    ?  MYRINGOTOMY    ? with tympanoplasty and mastoidectomy  ? NOSE SURGERY    ? TONSILLECTOMY    ? TOTAL VAGINAL HYSTERECTOMY    ? UPPER GASTROINTESTINAL ENDOSCOPY    ? ? ?Family History  ?Problem Relation Age of Onset  ? Bone cancer Father   ? Heart disease Father 73  ?     CAD-CABG  ? Leukemia Mother   ? Parkinsonism Mother   ? Heart attack Paternal Uncle   ? Colon cancer Neg Hx   ? Stomach cancer Neg Hx   ? Esophageal cancer Neg Hx   ? Rectal cancer Neg Hx   ? ? ?  ?Social History  ? ?Socioeconomic History  ? Marital status: Widowed  ?  Spouse name: Not on file  ? Number of children: 2  ? Years of education: Not on file  ? Highest education level: Not on file  ?Occupational History  ? Occupation: Retired  ?Tobacco Use  ? Smoking status: Never  ? Smokeless tobacco: Never  ?Vaping Use  ? Vaping Use: Never used  ?Substance and Sexual Activity  ? Alcohol use: No  ?  Alcohol/week: 0.0 standard drinks  ? Drug use: No  ? Sexual activity: Not Currently  ?Other Topics Concern  ?  Not on file  ?Social History Narrative  ? Daily caffeine   ? ?Social Determinants of Health  ? ?Financial Resource Strain: Not on file  ?Food Insecurity: Not on file  ?Transportation Needs: Not on file  ?Physical Activity: Not on file  ?Stress: Not on file  ?Social Connections: Not on file  ? ? ?Allergies  ?Allergen Reactions  ? Codeine Nausea And Vomiting  ? Droperidol Other (See Comments)  ?  Nervous & shaking  ? Barbiturates Rash  ? Meperidine Hcl Nausea And Vomiting  ? Pb-Hyoscy-Atropine-Scopolamine Other (See Comments)  ? Phenobarbital Rash  ? Sulfasalazine Other (See Comments)  ?  REACTION: nausea  ? Sulfonamide Derivatives Nausea Only  ?  REACTION: nausea  ? ? ? ?Current Outpatient Medications:  ?  azithromycin (ZITHROMAX) 500 MG tablet, Take 1 tablet (500 mg total) by mouth daily., Disp: 30 tablet, Rfl: 11 ?  cholecalciferol (VITAMIN D) 1000 units tablet, Take 50,000 Units by mouth. Reported on 08/02/2015, Disp: , Rfl:  ?  cyanocobalamin 1000  MCG tablet, Take 500 mcg by mouth daily. , Disp: , Rfl:  ?  denosumab (PROLIA) 60 MG/ML SOSY injection, Prolia, Disp: , Rfl:  ?  ethambutol (MYAMBUTOL) 400 MG tablet, Take 2 tablets (800 mg total) by mouth daily., Disp: 60 tablet, Rfl: 11 ?  fexofenadine (ALLEGRA) 180 MG tablet, Take 180 mg by mouth daily as needed for allergies. Reported on 04/14/2015, Disp: , Rfl:  ?  guaiFENesin (MUCINEX) 600 MG 12 hr tablet, Take 600 mg by mouth 2 (two) times daily. Reported on 08/02/2015, Disp: , Rfl:  ?  lansoprazole (PREVACID SOLUTAB) 30 MG disintegrating tablet, TAKE 1 TABLET (30 MG TOTAL) BY MOUTH 2 (TWO) TIMES DAILY BEFORE A MEAL., Disp: 60 tablet, Rfl: 11 ?  promethazine (PHENERGAN) 25 MG tablet, Take 25 mg by mouth every 6 (six) hours as needed for nausea or vomiting., Disp: , Rfl:  ?  propranolol (INDERAL) 10 MG tablet, Take 10 mg by mouth daily., Disp: , Rfl:  ?  Respiratory Therapy Supplies (FLUTTER) DEVI, Use as directed, Disp: 1 each, Rfl: 0 ?  rifampin (RIFADIN) 300 MG capsule, Take 2 capsules (600 mg total) by mouth daily., Disp: 60 capsule, Rfl: 11 ?  sucralfate (CARAFATE) 1 g tablet, TAKE 1 TABLET (1 G TOTAL) BY MOUTH 2 (TWO) TIMES DAILY., Disp: 180 tablet, Rfl: 3 ?  SUMAtriptan (IMITREX) 100 MG tablet, Take 100 mg by mouth as needed. Reported on 04/14/2015, Disp: , Rfl:  ?  topiramate (TOPAMAX) 200 MG tablet, Take 200 mg by mouth daily. Reported on 04/14/2015, Disp: , Rfl:  ? ?Current Facility-Administered Medications:  ?  0.9 %  sodium chloride infusion, 500 mL, Intravenous, Continuous, Nandigam, Kavitha V, MD ? ? ? ? ? ? ?Review of Systems  ?Constitutional:  Positive for fatigue. Negative for activity change, appetite change, chills, diaphoresis, fever and unexpected weight change.  ?HENT:  Positive for trouble swallowing. Negative for congestion, rhinorrhea, sinus pressure, sneezing and sore throat.   ?Eyes:  Negative for photophobia and visual disturbance.  ?Respiratory:  Negative for cough, chest tightness,  shortness of breath, wheezing and stridor.   ?Cardiovascular:  Negative for chest pain, palpitations and leg swelling.  ?Gastrointestinal:  Negative for abdominal distention, abdominal pain, anal bleeding, blood in stool, constipation, diarrhea, nausea and vomiting.  ?Genitourinary:  Negative for difficulty urinating, dysuria, flank pain and hematuria.  ?Musculoskeletal:  Negative for arthralgias, back pain, gait problem, joint swelling and myalgias.  ?Skin:  Negative for color  change, pallor, rash and wound.  ?Neurological:  Positive for tremors. Negative for dizziness, weakness and light-headedness.  ?Hematological:  Negative for adenopathy. Does not bruise/bleed easily.  ?Psychiatric/Behavioral:  Negative for agitation, behavioral problems, confusion, decreased concentration, dysphoric mood and sleep disturbance.   ? ?   ?Objective:  ? Physical Exam ?Constitutional:   ?   General: She is not in acute distress. ?   Appearance: Normal appearance. She is well-developed. She is not ill-appearing or diaphoretic.  ?HENT:  ?   Head: Normocephalic and atraumatic.  ?   Right Ear: Hearing and external ear normal.  ?   Left Ear: Hearing and external ear normal.  ?   Nose: No nasal deformity or rhinorrhea.  ?Eyes:  ?   General: No scleral icterus. ?   Conjunctiva/sclera: Conjunctivae normal.  ?   Right eye: Right conjunctiva is not injected.  ?   Left eye: Left conjunctiva is not injected.  ?   Pupils: Pupils are equal, round, and reactive to light.  ?Neck:  ?   Vascular: No JVD.  ?Cardiovascular:  ?   Rate and Rhythm: Normal rate and regular rhythm.  ?   Heart sounds: Normal heart sounds, S1 normal and S2 normal. No murmur heard. ?  No friction rub.  ?Pulmonary:  ?   Effort: Pulmonary effort is normal. No respiratory distress.  ?   Breath sounds: No wheezing.  ?Abdominal:  ?   General: Bowel sounds are normal. There is no distension.  ?   Palpations: Abdomen is soft.  ?   Tenderness: There is no abdominal tenderness.   ?Musculoskeletal:     ?   General: Normal range of motion.  ?   Right shoulder: Normal.  ?   Left shoulder: Normal.  ?   Cervical back: Normal range of motion and neck supple.  ?   Right hip: Normal.  ?   Left h

## 2021-07-06 ENCOUNTER — Encounter: Payer: Self-pay | Admitting: Pulmonary Disease

## 2021-07-06 ENCOUNTER — Ambulatory Visit: Payer: Medicare PPO | Admitting: Pulmonary Disease

## 2021-07-06 VITALS — BP 116/70 | HR 70 | Temp 97.6°F | Ht 66.5 in | Wt 108.6 lb

## 2021-07-06 DIAGNOSIS — A310 Pulmonary mycobacterial infection: Secondary | ICD-10-CM | POA: Diagnosis not present

## 2021-07-06 DIAGNOSIS — J479 Bronchiectasis, uncomplicated: Secondary | ICD-10-CM

## 2021-07-06 NOTE — Progress Notes (Signed)
[facility]  Chief Complaint  Patient presents with   Follow-up    SOB has worsened     Constitutional:  BP 116/70 (BP Location: Left Arm, Cuff Size: Normal)   Pulse 70   Temp 97.6 F (36.4 C)   Ht 5' 6.5" (1.689 m)   Wt 108 lb 9.6 oz (49.3 kg)   SpO2 98%   BMI 17.27 kg/m   Past Medical History:  Rt breast cancer, Cataract, Diverticulosis, Esophageal stricture, GERD, Tremor, Vit D deficiency, Migraine HA, Osteoporosis, Plantar fasciitis, Vit B12 deficiency  Past Surgical History:  Her  has a past surgical history that includes Total vaginal hysterectomy; Myringotomy; Laparoscopic cholecystectomy; Cataract extraction (Right); Breast lumpectomy (Right); Tonsillectomy; Mastectomy partial / lumpectomy; Abdominal hysterectomy; Upper gastrointestinal endoscopy; Nose surgery; Mastoidectomy; and Colonoscopy.  Brief Summary:  [name] is a 81 y.o. 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      Subjective:   She saw Dr. Tommy Medal.  Plan to stay on triple therapy.  Main issues now is refractory migraines.  She has been feeling nauseous and hasn't been able to eat much.  She has lost a few lbs.  She has appointment with neurology coming up.  Has cough with occasional clear sputum.  Using mucinex some.  No fever, chest pain, or hemoptysis.  Physical Exam:   Appearance - well kempt   ENMT - no sinus tenderness, no oral exudate, no LAN, Mallampati 2 airway, no stridor  Respiratory - equal breath sounds bilaterally, no wheezing or rales  CV - s1s2 regular rate and rhythm, no murmurs  Ext - no clubbing, no edema  Skin - no rashes  Psych - normal mood and affect    Pulmonary testing:  PFT 12/27/04 >> FEV1 2.34 (100%), FEV1% 56, TLC 6.04 (115%), DLCO 87% Bronchoscopy 02/04/07 >> Aspergillus species  PFT 05/21/12 >> FEV1 2.10 (98%), FEV1% 56%, TLC 4.83 (93%), DLCO 77% Sputum 09/15/14 >> MAI PFT 02/24/20 >> FEV1  1.64 (74%), FEV1% 54, TLC 7.95 (146%), DLCO 72%, +BD  Chest Imaging:  08/29/06 >> 6 mm nodule lingula, 2 mm nodule LLL, BTX in lingula and RML,  01/17/07 >> no change 02/16/10 >> BTX RML/lingula/RLL/LLL 06/01/12 >> progression of tree in bud pattern 09/06/14 >> BTX, increased nodularity, 2 cm nodule RLL, 1.3 cm nodule LLL 08/09/15 >> decreased to resolved nodules, no change in BTX CT angio chest 06/27/17 >> BTX most on left, scattered nodularity HRCT chest 01/25/20 >>  Extensive moderate cylindrical and varicoid BTX most in upper/mid zones, medial LLL nodule now 1.5 x 1.1 cm (was 1.1 x 0.5 cm in May 2019) HRCT chest 04/28/20 >> stable to decreased size of nodules, changes of BTX  Cardiac Tests:  Echo 08/18/17 >> EF 45 to 50%, grade 1 DD, mild MR  Social History:  She  reports that she has never smoked. She has never used smokeless tobacco. She reports that she does not drink alcohol and does not use drugs.  Family History:  Her family history includes Bone cancer in her father; Heart attack in her paternal uncle; Heart disease (age of onset: 14) in her father; Leukemia in her mother; Parkinsonism in her mother.      Assessment/Plan:   Bronchiectasis in setting of MAI. - followed by Dr. 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 - no response to previous trials of inhaler therapy with ICS/LAMA/LABA - monitor clinically  Upper airway cough syndrome. - reluctant to use nasal steroids in setting of MAI - advised her to avoid clearing her throat or forcing a cough - she can use salt water gargles - sugarless candy to keep her mouth moist and sip water when she has an urge to cough - teaspoon of honey prn as antitussive agent  Dyspnea on exertion. - she is unable to walk more than 200 feet without needing to rest - she  has handicap parking sticker  Migraine headaches. - she might need referral   Esophageal stricture. - she is followed by Dr. Silverio Decamp with [facility]  Time Spent Involved in Patient Care on Day of Examination:  38 minutes  Follow up:   There are no Patient Instructions on file for this visit.  Medication List:   Allergies as of 07/06/2021       Reactions   Codeine Nausea And Vomiting   Droperidol Other (See Comments)   Nervous & shaking   Barbiturates Rash   Meperidine Hcl Nausea And Vomiting   Pb-hyoscy-atropine-scopolamine Other (See Comments)   Phenobarbital Rash   Sulfasalazine Other (See Comments)   REACTION: nausea   Sulfonamide Derivatives Nausea Only   REACTION: nausea        Medication List        Accurate as of Jul 06, 2021  9:59 AM. If you have any questions, ask your nurse or doctor.          azithromycin 500 MG tablet Commonly known as: ZITHROMAX Take 1 tablet (500 mg total) by mouth daily.   cholecalciferol 1000 units tablet Commonly known as: VITAMIN D Take 50,000 Units by mouth. Reported on 08/02/2015   cyanocobalamin 1000 MCG tablet Take 500 mcg by mouth daily.   denosumab 60 MG/ML Sosy injection Commonly known as: PROLIA Prolia   ethambutol 400 MG tablet Commonly known as: MYAMBUTOL Take 2 tablets (800 mg total) by mouth daily.   fexofenadine 180 MG tablet Commonly known as: ALLEGRA Take 180 mg by mouth daily as needed for allergies. Reported on 04/14/2015   Flutter Devi Use as directed   guaiFENesin 600 MG 12 hr tablet Commonly known as: MUCINEX Take 600 mg by mouth 2 (two) times daily. Reported on 08/02/2015   lansoprazole 30 MG disintegrating tablet Commonly known as: PREVACID SOLUTAB TAKE 1 TABLET (30 MG TOTAL) BY MOUTH 2 (TWO) TIMES DAILY BEFORE A MEAL.   promethazine 25 MG tablet Commonly known as: PHENERGAN Take 25 mg by mouth every 6 (six) hours as needed for nausea or vomiting.   propranolol 10 MG  tablet Commonly known as: INDERAL Take 10 mg by mouth daily.   rifampin 300 MG capsule Commonly known as: RIFADIN Take 2 capsules (600 mg total) by mouth daily.   sucralfate 1 g tablet Commonly known as: CARAFATE TAKE 1 TABLET (1 G TOTAL) BY MOUTH 2 (TWO) TIMES DAILY.   SUMAtriptan 100 MG tablet Commonly known as: IMITREX Take 100 mg by mouth as needed. Reported on 04/14/2015   topiramate 200 MG tablet Commonly known as: TOPAMAX Take 200 mg by mouth daily. Reported on 04/14/2015        Signature:  Chesley Mires, MD [place] Pager - [phone] 07/06/2021, 9:59 AM

## 2021-07-06 NOTE — Patient Instructions (Signed)
Follow up in 6 months 

## 2021-08-31 DIAGNOSIS — D510 Vitamin B12 deficiency anemia due to intrinsic factor deficiency: Secondary | ICD-10-CM | POA: Diagnosis not present

## 2021-08-31 DIAGNOSIS — Z0000 Encounter for general adult medical examination without abnormal findings: Secondary | ICD-10-CM | POA: Diagnosis not present

## 2021-08-31 DIAGNOSIS — R5383 Other fatigue: Secondary | ICD-10-CM | POA: Diagnosis not present

## 2021-08-31 DIAGNOSIS — E786 Lipoprotein deficiency: Secondary | ICD-10-CM | POA: Diagnosis not present

## 2021-08-31 DIAGNOSIS — M810 Age-related osteoporosis without current pathological fracture: Secondary | ICD-10-CM | POA: Diagnosis not present

## 2021-08-31 DIAGNOSIS — R7989 Other specified abnormal findings of blood chemistry: Secondary | ICD-10-CM | POA: Diagnosis not present

## 2021-09-07 DIAGNOSIS — H9190 Unspecified hearing loss, unspecified ear: Secondary | ICD-10-CM | POA: Diagnosis not present

## 2021-09-07 DIAGNOSIS — Z1331 Encounter for screening for depression: Secondary | ICD-10-CM | POA: Diagnosis not present

## 2021-09-07 DIAGNOSIS — J479 Bronchiectasis, uncomplicated: Secondary | ICD-10-CM | POA: Diagnosis not present

## 2021-09-07 DIAGNOSIS — R1310 Dysphagia, unspecified: Secondary | ICD-10-CM | POA: Diagnosis not present

## 2021-09-07 DIAGNOSIS — Z0000 Encounter for general adult medical examination without abnormal findings: Secondary | ICD-10-CM | POA: Diagnosis not present

## 2021-09-07 DIAGNOSIS — A310 Pulmonary mycobacterial infection: Secondary | ICD-10-CM | POA: Diagnosis not present

## 2021-09-07 DIAGNOSIS — Z1339 Encounter for screening examination for other mental health and behavioral disorders: Secondary | ICD-10-CM | POA: Diagnosis not present

## 2021-09-07 DIAGNOSIS — D510 Vitamin B12 deficiency anemia due to intrinsic factor deficiency: Secondary | ICD-10-CM | POA: Diagnosis not present

## 2021-09-07 DIAGNOSIS — R5383 Other fatigue: Secondary | ICD-10-CM | POA: Diagnosis not present

## 2021-09-07 DIAGNOSIS — R82998 Other abnormal findings in urine: Secondary | ICD-10-CM | POA: Diagnosis not present

## 2021-09-07 DIAGNOSIS — G8929 Other chronic pain: Secondary | ICD-10-CM | POA: Diagnosis not present

## 2021-09-07 DIAGNOSIS — M810 Age-related osteoporosis without current pathological fracture: Secondary | ICD-10-CM | POA: Diagnosis not present

## 2021-09-20 ENCOUNTER — Other Ambulatory Visit (HOSPITAL_COMMUNITY): Payer: Self-pay | Admitting: *Deleted

## 2021-09-21 ENCOUNTER — Ambulatory Visit (HOSPITAL_COMMUNITY)
Admission: RE | Admit: 2021-09-21 | Discharge: 2021-09-21 | Disposition: A | Discharge status: Home / Self Care | Payer: Medicare PPO | Source: Ambulatory Visit | Attending: Internal Medicine | Admitting: Internal Medicine

## 2021-09-21 DIAGNOSIS — M810 Age-related osteoporosis without current pathological fracture: Secondary | ICD-10-CM | POA: Diagnosis not present

## 2021-09-21 MED ORDER — DENOSUMAB 60 MG/ML ~~LOC~~ SOSY
PREFILLED_SYRINGE | SUBCUTANEOUS | Status: AC
  Filled 2021-09-21: qty 1

## 2021-09-21 MED ORDER — DENOSUMAB 60 MG/ML ~~LOC~~ SOSY
60.0000 mg | PREFILLED_SYRINGE | Freq: Once | SUBCUTANEOUS | Status: AC
  Administered 2021-09-21: 60 mg via SUBCUTANEOUS

## 2021-10-11 DIAGNOSIS — M810 Age-related osteoporosis without current pathological fracture: Secondary | ICD-10-CM | POA: Diagnosis not present

## 2021-11-13 ENCOUNTER — Ambulatory Visit: Payer: Medicare PPO | Admitting: Infectious Disease

## 2021-11-14 DIAGNOSIS — Z1231 Encounter for screening mammogram for malignant neoplasm of breast: Secondary | ICD-10-CM | POA: Diagnosis not present

## 2021-11-15 ENCOUNTER — Other Ambulatory Visit: Payer: Self-pay

## 2021-11-15 ENCOUNTER — Other Ambulatory Visit: Payer: Self-pay | Admitting: Infectious Disease

## 2021-11-15 DIAGNOSIS — A310 Pulmonary mycobacterial infection: Secondary | ICD-10-CM

## 2021-11-15 MED ORDER — AZITHROMYCIN 500 MG PO TABS: 500.0000 mg | ORAL_TABLET | Freq: Every day | ORAL | 0 refills | Status: DC

## 2021-11-15 MED ORDER — ETHAMBUTOL HCL 400 MG PO TABS
800.0000 mg | ORAL_TABLET | Freq: Every day | ORAL | 0 refills | Status: DC
Start: 2021-11-15 — End: 2021-11-28

## 2021-11-15 MED ORDER — RIFAMPIN 300 MG PO CAPS: 600.0000 mg | ORAL_CAPSULE | Freq: Every day | ORAL | 0 refills | Status: DC

## 2021-11-19 ENCOUNTER — Encounter: Payer: Self-pay | Admitting: Gastroenterology

## 2021-11-28 ENCOUNTER — Other Ambulatory Visit: Payer: Self-pay

## 2021-11-28 ENCOUNTER — Encounter: Payer: Self-pay | Admitting: Infectious Disease

## 2021-11-28 ENCOUNTER — Ambulatory Visit: Payer: Medicare PPO | Admitting: Infectious Disease

## 2021-11-28 VITALS — BP 120/80 | HR 78 | Temp 97.8°F | Wt 111.2 lb

## 2021-11-28 DIAGNOSIS — A310 Pulmonary mycobacterial infection: Secondary | ICD-10-CM | POA: Diagnosis not present

## 2021-11-28 DIAGNOSIS — Z23 Encounter for immunization: Secondary | ICD-10-CM

## 2021-11-28 DIAGNOSIS — G43909 Migraine, unspecified, not intractable, without status migrainosus: Secondary | ICD-10-CM

## 2021-11-28 DIAGNOSIS — K047 Periapical abscess without sinus: Secondary | ICD-10-CM

## 2021-11-28 DIAGNOSIS — Z7185 Encounter for immunization safety counseling: Secondary | ICD-10-CM

## 2021-11-28 MED ORDER — AZITHROMYCIN 500 MG PO TABS
500.0000 mg | ORAL_TABLET | Freq: Every day | ORAL | 11 refills | Status: DC
Start: 2021-11-28 — End: 2022-10-07

## 2021-11-28 MED ORDER — ETHAMBUTOL HCL 400 MG PO TABS: 800.0000 mg | ORAL_TABLET | Freq: Every day | ORAL | 11 refills | Status: DC

## 2021-11-28 MED ORDER — RIFAMPIN 300 MG PO CAPS: 600.0000 mg | ORAL_CAPSULE | Freq: Every day | ORAL | 11 refills | Status: DC

## 2021-11-28 NOTE — Progress Notes (Signed)
Subjective:  Chief complaints: Follow-up for mycobacterial infection with still some chronic fatigue symptoms  Patient ID: [name], female    DOB: 1941-01-04, 81 y.o.   MRN: [id number]    HPI  1  old with nodular, bronchiectatic M avium infection who has been tolerating 3 drug rx for M avium since 2014,  Camari did come off her medications briefly while taking Keflex for a dental infection.  She does continue to suffer from chronic fatigue symptoms.  On the whole though she feels better and does not have nearly as much coughing as she has had when she goes off of her medications.  She does still take mucinex when she has more productive cough.   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   Low serum vitamin D 03/06/2015   Lumbar disc herniation    Lung nodule 11/14/2020   Malaise 11/18/2017   Migraine    Nausea 07/16/2017   Osteopenia    Osteoporosis    EARLY STAGES   Ovarian cyst    Plantar fasciitis    Pneumonia    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[place])    2006,2009,2015   Vaccine counseling 11/09/2019   Vitamin B12 deficiency    Weight loss 11/30/2014    Past Surgical History:  Procedure Laterality Date   ABDOMINAL HYSTERECTOMY     BREAST LUMPECTOMY Right    CATARACT EXTRACTION Right    COLONOSCOPY     LAPAROSCOPIC CHOLECYSTECTOMY     MASTECTOMY PARTIAL / LUMPECTOMY     MASTOIDECTOMY     MYRINGOTOMY     with tympanoplasty and mastoidectomy   NOSE SURGERY     TONSILLECTOMY     TOTAL VAGINAL HYSTERECTOMY     UPPER  GASTROINTESTINAL ENDOSCOPY      Family History  Problem Relation Age of Onset   Bone cancer Father    Heart disease Father 35       CAD-CABG   Leukemia Mother    Parkinsonism Mother    Heart attack Paternal Uncle    Colon cancer Neg Hx    Stomach cancer Neg Hx    Esophageal cancer Neg Hx    Rectal cancer Neg Hx       Social History   Socioeconomic History   Marital status: Widowed    Spouse name: Not on file   Number of children: 2   Years of education: Not on file   Highest education level: Not on file  Occupational History   Occupation: Retired  Tobacco Use   Smoking status: Never   Smokeless tobacco: Never  Vaping Use   Vaping Use: Never used  Substance and Sexual Activity   Alcohol use: No    Alcohol/week: 0.0 standard drinks of alcohol   Drug use: No   Sexual activity: Not Currently  Other Topics Concern   Not on file  Social History Narrative   Daily caffeine    Social Determinants of  Health   Financial Resource Strain: Not on file  Food Insecurity: Not on file  Transportation Needs: Not on file  Physical Activity: Not on file  Stress: Not on file  Social Connections: Not on file    Allergies  Allergen Reactions   Codeine Nausea And Vomiting   Droperidol Other (See Comments)    Nervous & shaking   Barbiturates Rash   Meperidine Hcl Nausea And Vomiting   Pb-Hyoscy-Atropine-Scopolamine Other (See Comments)   Phenobarbital Rash   Sulfasalazine Other (See Comments)    REACTION: nausea   Sulfonamide Derivatives Nausea Only    REACTION: nausea     Current Outpatient Medications:    azithromycin (ZITHROMAX) 500 MG tablet, Take 1 tablet (500 mg total) by mouth daily., Disp: 30 tablet, Rfl: 0   cholecalciferol (VITAMIN D) 1000 units tablet, Take 50,000 Units by mouth. Reported on 08/02/2015, Disp: , Rfl:    cyanocobalamin 1000 MCG tablet, Take 500 mcg by mouth daily. , Disp: , Rfl:    denosumab (PROLIA) 60 MG/ML SOSY injection, Prolia, Disp: , Rfl:     ethambutol (MYAMBUTOL) 400 MG tablet, Take 2 tablets (800 mg total) by mouth daily., Disp: 60 tablet, Rfl: 0   guaiFENesin (MUCINEX) 600 MG 12 hr tablet, Take 600 mg by mouth 2 (two) times daily. Reported on 08/02/2015, Disp: , Rfl:    promethazine (PHENERGAN) 25 MG tablet, Take 25 mg by mouth every 6 (six) hours as needed for nausea or vomiting., Disp: , Rfl:    propranolol (INDERAL) 10 MG tablet, Take 10 mg by mouth daily., Disp: , Rfl:    Respiratory Therapy Supplies (FLUTTER) DEVI, Use as directed, Disp: 1 each, Rfl: 0   rifampin (RIFADIN) 300 MG capsule, Take 2 capsules (600 mg total) by mouth daily., Disp: 60 capsule, Rfl: 0   sucralfate (CARAFATE) 1 g tablet, TAKE 1 TABLET (1 G TOTAL) BY MOUTH 2 (TWO) TIMES DAILY., Disp: 180 tablet, Rfl: 3   SUMAtriptan (IMITREX) 100 MG tablet, Take 100 mg by mouth as needed. Reported on 04/14/2015, Disp: , Rfl:    topiramate (TOPAMAX) 200 MG tablet, Take 200 mg by mouth daily. Reported on 04/14/2015, Disp: , Rfl:    cephALEXin (KEFLEX) 500 MG capsule, Take 500 mg by mouth every 6 (six) hours. (Patient not taking: Reported on 11/28/2021), Disp: , Rfl:    fexofenadine (ALLEGRA) 180 MG tablet, Take 180 mg by mouth daily as needed for allergies. Reported on 04/14/2015 (Patient not taking: Reported on 11/28/2021), Disp: , Rfl:    lansoprazole (PREVACID SOLUTAB) 30 MG disintegrating tablet, TAKE 1 TABLET (30 MG TOTAL) BY MOUTH 2 (TWO) TIMES DAILY BEFORE A MEAL. (Patient not taking: Reported on 11/28/2021), Disp: 60 tablet, Rfl: 11  Current Facility-Administered Medications:    0.9 %  sodium chloride infusion, 500 mL, Intravenous, Continuous, Nandigam, Kavitha V, MD       Review of Systems  Constitutional:  Positive for fatigue. Negative for activity change, appetite change, chills, diaphoresis, fever and unexpected weight change.  HENT:  Negative for congestion, rhinorrhea, sinus pressure, sneezing, sore throat and trouble swallowing.   Eyes:  Negative for  photophobia and visual disturbance.  Respiratory:  Positive for cough. Negative for chest tightness, shortness of breath, wheezing and stridor.   Cardiovascular:  Negative for chest pain, palpitations and leg swelling.  Gastrointestinal:  Negative for abdominal distention, abdominal pain, anal bleeding, blood in stool, constipation, diarrhea, nausea and vomiting.  Genitourinary:  Negative for difficulty urinating, dysuria, flank pain and  hematuria.  Musculoskeletal:  Negative for arthralgias, back pain, gait problem, joint swelling and myalgias.  Skin:  Negative for color change, pallor, rash and wound.  Neurological:  Negative for dizziness, tremors, weakness and light-headedness.  Hematological:  Negative for adenopathy. Does not bruise/bleed easily.  Psychiatric/Behavioral:  Negative for agitation, behavioral problems, confusion, decreased concentration, dysphoric mood and sleep disturbance.        Objective:   Physical Exam Constitutional:      General: She is not in acute distress.    Appearance: Normal appearance. She is well-developed. She is not ill-appearing or diaphoretic.  HENT:     Head: Normocephalic and atraumatic.     Right Ear: Hearing and external ear normal.     Left Ear: Hearing and external ear normal.     Nose: No nasal deformity or rhinorrhea.  Eyes:     General: No scleral icterus.    Conjunctiva/sclera: Conjunctivae normal.     Right eye: Right conjunctiva is not injected.     Left eye: Left conjunctiva is not injected.     Pupils: Pupils are equal, round, and reactive to light.  Neck:     Vascular: No JVD.  Cardiovascular:     Rate and Rhythm: Normal rate and regular rhythm.     Heart sounds: Normal heart sounds, S1 normal and S2 normal. No murmur heard.    No friction rub. No gallop.  Pulmonary:     Effort: Prolonged expiration present. No respiratory distress.     Breath sounds: No stridor. No wheezing, rhonchi or rales.  Abdominal:     General: Bowel  sounds are normal. There is no distension.     Palpations: Abdomen is soft.     Tenderness: There is no abdominal tenderness.  Musculoskeletal:        General: Normal range of motion.     Right shoulder: Normal.     Left shoulder: Normal.     Cervical back: Normal range of motion and neck supple.     Right hip: Normal.     Left hip: Normal.     Right knee: Normal.     Left knee: Normal.  Lymphadenopathy:     Head:     Right side of head: No submandibular, preauricular or posterior auricular adenopathy.     Left side of head: No submandibular, preauricular or posterior auricular adenopathy.     Cervical: No cervical adenopathy.     Right cervical: No superficial or deep cervical adenopathy.    Left cervical: No superficial or deep cervical adenopathy.  Skin:    General: Skin is warm and dry.     Coloration: Skin is not pale.     Findings: No abrasion, bruising, ecchymosis, erythema, lesion or rash.     Nails: There is no clubbing.  Neurological:     Mental Status: She is alert and oriented to person, place, and time.     Sensory: No sensory deficit.     Coordination: Coordination normal.     Gait: Gait normal.  Psychiatric:        Attention and Perception: She is attentive.        Speech: Speech normal.        Behavior: Behavior normal. Behavior is cooperative.        Thought Content: Thought content normal.        Judgment: Judgment normal.           Assessment & Plan:   Mycobacterium Avium infection:  I will continue her azithromycin ethambutol and rifampin.  She will return to clinic in 1 month's year.  Migraiine headaches: improved  Dental infection: resolved  Vaccine counseling: recommended flu, RSV, and are 20 and updated COVID-19 vaccine.  She was agreeable to flu shot today would like to come back for Prevnar vaccine.

## 2021-12-12 ENCOUNTER — Telehealth: Payer: Self-pay

## 2021-12-12 ENCOUNTER — Other Ambulatory Visit: Payer: Self-pay | Admitting: Infectious Disease

## 2021-12-12 DIAGNOSIS — A310 Pulmonary mycobacterial infection: Secondary | ICD-10-CM

## 2021-12-12 NOTE — Addendum Note (Signed)
Addended by: Lucie Leather D on: 12/12/2021 11:45 AM   Modules accepted: Orders

## 2021-12-12 NOTE — Telephone Encounter (Signed)
Patient called, says ever since she got her flu shot a few weeks ago she has been experiencing a productive cough and is wondering if it's still okay for her to get her P20 vaccine tomorrow. Discussed that since she is not acutely sick with fever and that cough is expected with her condition that Prevnar 20 vaccine should be fine.   Will see if provider would like sputum culture.   Beryle Flock, RN

## 2021-12-13 ENCOUNTER — Ambulatory Visit (INDEPENDENT_AMBULATORY_CARE_PROVIDER_SITE_OTHER): Payer: Medicare PPO

## 2021-12-13 ENCOUNTER — Other Ambulatory Visit: Payer: Self-pay

## 2021-12-13 ENCOUNTER — Other Ambulatory Visit: Payer: Medicare PPO

## 2021-12-13 DIAGNOSIS — Z23 Encounter for immunization: Secondary | ICD-10-CM | POA: Diagnosis not present

## 2021-12-13 DIAGNOSIS — A310 Pulmonary mycobacterial infection: Secondary | ICD-10-CM | POA: Diagnosis not present

## 2022-01-28 LAB — MYCOBACTERIA,CULT W/FLUOROCHROME SMEAR
MICRO NUMBER:: 14109276
SMEAR:: NONE SEEN
SPECIMEN QUALITY:: ADEQUATE

## 2022-03-08 ENCOUNTER — Encounter (HOSPITAL_BASED_OUTPATIENT_CLINIC_OR_DEPARTMENT_OTHER): Payer: Self-pay | Admitting: Pulmonary Disease

## 2022-03-08 ENCOUNTER — Ambulatory Visit (INDEPENDENT_AMBULATORY_CARE_PROVIDER_SITE_OTHER): Payer: Medicare PPO | Admitting: Pulmonary Disease

## 2022-03-08 VITALS — BP 106/68 | HR 87 | Temp 98.2°F | Ht 66.0 in | Wt 106.8 lb

## 2022-03-08 DIAGNOSIS — J479 Bronchiectasis, uncomplicated: Secondary | ICD-10-CM | POA: Diagnosis not present

## 2022-03-08 NOTE — Patient Instructions (Signed)
Follow up in 6 months 

## 2022-03-08 NOTE — Progress Notes (Signed)
[facility]  Chief Complaint  Patient presents with   Follow-up    Pt states she had a bad year health wise last year.     Constitutional:  BP 106/68 (BP Location: Left Arm, Patient Position: Sitting, Cuff Size: Normal)   Pulse 87   Temp 98.2 F (36.8 C) (Oral)   Ht '5\' 6"'$  (1.676 m)   Wt 106 lb 12.8 oz (48.4 kg)   SpO2 99%   BMI 17.24 kg/m   Past Medical History:  Rt breast cancer, Cataract, Diverticulosis, Esophageal stricture, GERD, Tremor, Vit D deficiency, Migraine HA, Osteoporosis, Plantar fasciitis, Vit B12 deficiency  Past Surgical History:  Her  has a past surgical history that includes Total vaginal hysterectomy; Myringotomy; Laparoscopic cholecystectomy; Cataract extraction (Right); Breast lumpectomy (Right); Tonsillectomy; Mastectomy partial / lumpectomy; Abdominal hysterectomy; Upper gastrointestinal endoscopy; Nose surgery; Mastoidectomy; and Colonoscopy.  Brief Summary:  [name] is a 82 y.o. 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      Subjective:   She had a rough time over the past few months.  She has trouble with headache, reflux and nausea, and diverticulitis.  She lost weight.  Her GI issues have stabilized and appetite improved.  Weight picking up again.  Has occasional cough with phlegm.  Not having fever, sweats, or chest pain.    Physical Exam:   Appearance - well kempt   ENMT - no sinus tenderness, no oral exudate, no LAN, Mallampati 3 airway, no stridor  Respiratory - equal breath sounds bilaterally, no wheezing or rales  CV - s1s2 regular rate and rhythm, no murmurs  Ext - no clubbing, no edema  Skin - no rashes  Psych - normal mood and affect    Pulmonary testing:  PFT 12/27/04 >> FEV1 2.34 (100%), FEV1% 56, TLC 6.04 (115%), DLCO 87% Bronchoscopy 02/04/07 >> Aspergillus species  PFT 05/21/12 >> FEV1 2.10 (98%), FEV1% 56%, TLC 4.83 (93%), DLCO 77% Sputum 09/15/14 >>  MAI PFT 02/24/20 >> FEV1 1.64 (74%), FEV1% 54, TLC 7.95 (146%), DLCO 72%, +BD  Chest Imaging:  08/29/06 >> 6 mm nodule lingula, 2 mm nodule LLL, BTX in lingula and RML,  01/17/07 >> no change 02/16/10 >> BTX RML/lingula/RLL/LLL 06/01/12 >> progression of tree in bud pattern 09/06/14 >> BTX, increased nodularity, 2 cm nodule RLL, 1.3 cm nodule LLL 08/09/15 >> decreased to resolved nodules, no change in BTX CT angio chest 06/27/17 >> BTX most on left, scattered nodularity HRCT chest 01/25/20 >>  Extensive moderate cylindrical and varicoid BTX most in upper/mid zones, medial LLL nodule now 1.5 x 1.1 cm (was 1.1 x 0.5 cm in May 2019) HRCT chest 04/28/20 >> stable to decreased size of nodules, changes of BTX  Cardiac Tests:  Echo 08/18/17 >> EF 45 to 50%, grade 1 DD, mild MR  Social History:  She  reports that she has never smoked. She has never used smokeless tobacco. She reports that she does not drink alcohol and does not use drugs.  Family History:  Her family history includes Bone cancer in her father; Heart attack in her paternal uncle; Heart disease (age of onset: 21) in her father; Leukemia in her mother; Parkinsonism in her mother.      Assessment/Plan:   Bronchiectasis in setting of MAI. - followed by Dr. 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 - no response to previous trials of inhaler therapy with ICS/LAMA/LABA - monitor clinically  Upper airway cough syndrome. - reluctant to use nasal steroids in setting of MAI - advised her to avoid clearing her throat or forcing a cough - she can use salt water gargles - sugarless candy to keep her mouth moist and sip water when she has an urge to cough - teaspoon of honey prn as antitussive agent  Dyspnea on exertion. - she is unable to walk more than 200 feet without needing  to rest - she has handicap parking sticker  Migraine headaches. - follow up with PCP   Esophageal stricture. - she is followed by Dr. Silverio Decamp with [facility]  Time Spent Involved in Patient Care on Day of Examination:  27 minutes  Follow up:   There are no Patient Instructions on file for this visit.  Medication List:   Allergies as of 03/08/2022       Reactions   Codeine Nausea And Vomiting   Droperidol Other (See Comments)   Nervous & shaking   Barbiturates Rash   Meperidine Hcl Nausea And Vomiting   Pb-hyoscy-atropine-scopolamine Other (See Comments)   Phenobarbital Rash   Sulfasalazine Other (See Comments)   REACTION: nausea   Sulfonamide Derivatives Nausea Only   REACTION: nausea        Medication List        Accurate as of March 08, 2022 11:37 AM. If you have any questions, ask your nurse or doctor.          STOP taking these medications    fexofenadine 180 MG tablet Commonly known as: ALLEGRA Stopped by: Chesley Mires, MD   sucralfate 1 g tablet Commonly known as: CARAFATE Stopped by: Chesley Mires, MD       TAKE these medications    azithromycin 500 MG tablet Commonly known as: ZITHROMAX Take 1 tablet (500 mg total) by mouth daily.   cephALEXin 500 MG capsule Commonly known as: KEFLEX Take 500 mg by mouth every 6 (six) hours.   cholecalciferol 1000 units tablet Commonly known as: VITAMIN D Take 50,000 Units by mouth. Reported on 08/02/2015   cyanocobalamin 1000 MCG tablet Take 500 mcg by mouth daily.   denosumab 60 MG/ML Sosy injection Commonly known as: PROLIA Prolia   ethambutol 400 MG tablet Commonly known as: MYAMBUTOL Take 2 tablets (800 mg total) by mouth daily.   Flutter Devi Use as directed   guaiFENesin 600 MG 12 hr tablet Commonly known as: MUCINEX Take 600 mg by mouth 2 (two) times daily. Reported on 08/02/2015   lansoprazole 30 MG disintegrating tablet Commonly known as: PREVACID SOLUTAB TAKE 1  TABLET (30 MG TOTAL) BY MOUTH 2 (TWO) TIMES DAILY BEFORE A MEAL.   promethazine 25 MG tablet Commonly known as: PHENERGAN Take 25 mg by mouth every 6 (six) hours as needed for nausea or vomiting.   propranolol 10 MG tablet Commonly known as: INDERAL Take 10 mg by mouth daily.   rifampin 300 MG capsule Commonly known as: RIFADIN Take 2 capsules (600 mg total) by mouth daily.   SUMAtriptan 100 MG tablet Commonly known as: IMITREX Take 100 mg by mouth as needed. Reported on 04/14/2015   topiramate 200 MG tablet Commonly known as: TOPAMAX Take 200 mg by mouth daily. Reported on 04/14/2015        Signature:  Chesley Mires, MD [facility] Pager - [phone] 03/08/2022, 11:37 AM

## 2022-04-09 DIAGNOSIS — G43719 Chronic migraine without aura, intractable, without status migrainosus: Secondary | ICD-10-CM | POA: Diagnosis not present

## 2022-09-09 ENCOUNTER — Telehealth: Payer: Self-pay

## 2022-09-09 NOTE — Telephone Encounter (Signed)
Patient called wanting to go over sputum culture results from 11/2021, relayed that there was no mycobacterial growth. She would like to know what next steps are. Will route to provider.   Sandie Ano, RN

## 2022-09-10 ENCOUNTER — Ambulatory Visit (HOSPITAL_BASED_OUTPATIENT_CLINIC_OR_DEPARTMENT_OTHER): Payer: Medicare PPO | Admitting: Pulmonary Disease

## 2022-09-23 DIAGNOSIS — G43719 Chronic migraine without aura, intractable, without status migrainosus: Secondary | ICD-10-CM | POA: Diagnosis not present

## 2022-10-06 NOTE — Progress Notes (Unsigned)
Subjective:  Chief complaints: For Mycobacterium AVM infection Patient ID: [name], female    DOB: 11-Mar-1940, 82 y.o.   MRN: [id number]    HPI  55  old with nodular, bronchiectatic M avium infection who has been tolerating 3 drug rx for M avium since 2014,    Since I last saw her she had had a bout of coughing this fall after upper respiratory tract infection and we checked an AFB culture which was negative.  She continues to follow closely Dr. Craige Cotta.  She continues to have some nonproductive cough largely and continue to have dyspnea and fatigue but feels better than when she is off of the medications.     Past Medical History:  Diagnosis Date   Allergy    SEASONAL   Anemia    Breast cancer, right (HCC)    Bronchiectasis (HCC)    Bronchiectasis (HCC) 11/30/2014   Cataract    bil removed - with lense implant   Dilation of biliary tract    Diverticulosis    Dysphagia 11/18/2017   Dyspnea    with exertion    Esophageal stricture 11/09/2019   Fatigue 07/16/2017   GERD (gastroesophageal reflux disease)    History of radiation therapy    Intention tremor 03/06/2015   Low serum vitamin D 03/06/2015   Lumbar disc herniation    Lung nodule 11/14/2020   Malaise 11/18/2017   Migraine    Nausea 07/16/2017   Osteopenia    Osteoporosis    EARLY STAGES   Ovarian cyst    Plantar fasciitis    Pneumonia    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,2009,2015   Vaccine counseling 11/09/2019   Vitamin B12 deficiency    Weight loss 11/30/2014    Past Surgical History:  Procedure Laterality Date   ABDOMINAL HYSTERECTOMY     BREAST LUMPECTOMY Right    CATARACT EXTRACTION Right    COLONOSCOPY     LAPAROSCOPIC CHOLECYSTECTOMY     MASTECTOMY PARTIAL / LUMPECTOMY     MASTOIDECTOMY     MYRINGOTOMY     with tympanoplasty and mastoidectomy   NOSE SURGERY     TONSILLECTOMY     TOTAL VAGINAL HYSTERECTOMY     UPPER GASTROINTESTINAL ENDOSCOPY       Family History  Problem Relation Age of Onset   Bone cancer Father    Heart disease Father 10       CAD-CABG   Leukemia Mother    Parkinsonism Mother    Heart attack Paternal Uncle    Colon cancer Neg Hx    Stomach cancer Neg Hx    Esophageal cancer Neg Hx    Rectal cancer Neg Hx       Social History   Socioeconomic History   Marital status: Widowed    Spouse name: Not on file   Number of children: 2   Years of education: Not on file   Highest education level: Not on file  Occupational History   Occupation: Retired  Tobacco Use   Smoking status: Never   Smokeless tobacco: Never  Vaping Use   Vaping status: Never Used  Substance and Sexual Activity   Alcohol use: No    Alcohol/week: 0.0 standard drinks of alcohol   Drug use: No   Sexual activity: Not Currently  Other Topics Concern   Not on file  Social History Narrative   Daily caffeine    Social Determinants of Health  Financial Resource Strain: Not on file  Food Insecurity: Not on file  Transportation Needs: Not on file  Physical Activity: Not on file  Stress: Not on file  Social Connections: Not on file    Allergies  Allergen Reactions   Codeine Nausea And Vomiting   Droperidol Other (See Comments)    Nervous & shaking   Barbiturates Rash   Meperidine Hcl Nausea And Vomiting   Pb-Hyoscy-Atropine-Scopolamine Other (See Comments)   Phenobarbital Rash   Sulfasalazine Other (See Comments)    REACTION: nausea   Sulfonamide Derivatives Nausea Only    REACTION: nausea     Current Outpatient Medications:    azithromycin (ZITHROMAX) 500 MG tablet, Take 1 tablet (500 mg total) by mouth daily., Disp: 30 tablet, Rfl: 11   cephALEXin (KEFLEX) 500 MG capsule, Take 500 mg by mouth every 6 (six) hours., Disp: , Rfl:    cholecalciferol (VITAMIN D) 1000 units tablet, Take 50,000 Units by mouth. Reported on 08/02/2015, Disp: , Rfl:    cyanocobalamin 1000 MCG tablet, Take 500 mcg by mouth daily. , Disp: ,  Rfl:    denosumab (PROLIA) 60 MG/ML SOSY injection, Prolia, Disp: , Rfl:    ethambutol (MYAMBUTOL) 400 MG tablet, Take 2 tablets (800 mg total) by mouth daily., Disp: 60 tablet, Rfl: 11   guaiFENesin (MUCINEX) 600 MG 12 hr tablet, Take 600 mg by mouth 2 (two) times daily. Reported on 08/02/2015, Disp: , Rfl:    lansoprazole (PREVACID SOLUTAB) 30 MG disintegrating tablet, TAKE 1 TABLET (30 MG TOTAL) BY MOUTH 2 (TWO) TIMES DAILY BEFORE A MEAL., Disp: 60 tablet, Rfl: 11   promethazine (PHENERGAN) 25 MG tablet, Take 25 mg by mouth every 6 (six) hours as needed for nausea or vomiting., Disp: , Rfl:    propranolol (INDERAL) 10 MG tablet, Take 10 mg by mouth daily., Disp: , Rfl:    Respiratory Therapy Supplies (FLUTTER) DEVI, Use as directed, Disp: 1 each, Rfl: 0   rifampin (RIFADIN) 300 MG capsule, Take 2 capsules (600 mg total) by mouth daily., Disp: 60 capsule, Rfl: 11   SUMAtriptan (IMITREX) 100 MG tablet, Take 100 mg by mouth as needed. Reported on 04/14/2015, Disp: , Rfl:    topiramate (TOPAMAX) 200 MG tablet, Take 200 mg by mouth daily. Reported on 04/14/2015, Disp: , Rfl:   Current Facility-Administered Medications:    0.9 %  sodium chloride infusion, 500 mL, Intravenous, Continuous, Nandigam, Kavitha V, MD       Review of Systems  Constitutional:  Negative for activity change, appetite change, chills, diaphoresis, fatigue, fever and unexpected weight change.  HENT:  Negative for congestion, rhinorrhea, sinus pressure, sneezing, sore throat and trouble swallowing.   Eyes:  Negative for photophobia and visual disturbance.  Respiratory:  Negative for cough, chest tightness, shortness of breath, wheezing and stridor.   Cardiovascular:  Negative for chest pain, palpitations and leg swelling.  Gastrointestinal:  Negative for abdominal distention, abdominal pain, anal bleeding, blood in stool, constipation, diarrhea, nausea and vomiting.  Genitourinary:  Negative for difficulty urinating, dysuria,  flank pain and hematuria.  Musculoskeletal:  Negative for arthralgias, back pain, gait problem, joint swelling and myalgias.  Skin:  Negative for color change, pallor, rash and wound.  Neurological:  Negative for dizziness, tremors, weakness and light-headedness.  Hematological:  Negative for adenopathy. Does not bruise/bleed easily.  Psychiatric/Behavioral:  Negative for agitation, behavioral problems, confusion, decreased concentration, dysphoric mood and sleep disturbance.        Objective:   Physical  Exam Constitutional:      General: She is not in acute distress.    Appearance: Normal appearance. She is well-developed. She is not ill-appearing or diaphoretic.  HENT:     Head: Normocephalic and atraumatic.     Right Ear: Hearing and external ear normal.     Left Ear: Hearing and external ear normal.     Nose: No nasal deformity or rhinorrhea.  Eyes:     General: No scleral icterus.    Conjunctiva/sclera: Conjunctivae normal.     Right eye: Right conjunctiva is not injected.     Left eye: Left conjunctiva is not injected.     Pupils: Pupils are equal, round, and reactive to light.  Neck:     Vascular: No JVD.  Cardiovascular:     Rate and Rhythm: Normal rate and regular rhythm.     Heart sounds: S1 normal and S2 normal.  Pulmonary:     Effort: Prolonged expiration present.     Breath sounds: No wheezing or rales.  Abdominal:     General: Bowel sounds are normal. There is no distension.     Palpations: Abdomen is soft.     Tenderness: There is no abdominal tenderness.  Musculoskeletal:        General: Normal range of motion.     Right shoulder: Normal.     Left shoulder: Normal.     Cervical back: Normal range of motion and neck supple.     Right hip: Normal.     Left hip: Normal.     Right knee: Normal.     Left knee: Normal.  Lymphadenopathy:     Head:     Right side of head: No submandibular, preauricular or posterior auricular adenopathy.     Left side of head:  No submandibular, preauricular or posterior auricular adenopathy.     Cervical: No cervical adenopathy.     Right cervical: No superficial or deep cervical adenopathy.    Left cervical: No superficial or deep cervical adenopathy.  Skin:    General: Skin is warm and dry.     Coloration: Skin is not pale.     Findings: No abrasion, bruising, ecchymosis, erythema, lesion or rash.     Nails: There is no clubbing.  Neurological:     General: No focal deficit present.     Mental Status: She is alert and oriented to person, place, and time.     Sensory: No sensory deficit.     Coordination: Coordination normal.     Gait: Gait normal.  Psychiatric:        Attention and Perception: She is attentive.        Mood and Affect: Mood normal.        Speech: Speech normal.        Behavior: Behavior normal. Behavior is cooperative.        Thought Content: Thought content normal.        Judgment: Judgment normal.           Assessment & Plan:   Mycobacterium avium infection:  Continue azithromycin, rifampin and ethambutol  Headaches still struggling with these  Vaccine counseling:: Recommend she obtain the updated COVID and flu vaccine this fall

## 2022-10-07 ENCOUNTER — Other Ambulatory Visit: Payer: Self-pay

## 2022-10-07 ENCOUNTER — Ambulatory Visit: Payer: Medicare PPO | Admitting: Infectious Disease

## 2022-10-07 ENCOUNTER — Encounter: Payer: Self-pay | Admitting: Infectious Disease

## 2022-10-07 VITALS — BP 167/93 | HR 86 | Temp 97.9°F | Ht 66.0 in | Wt 111.0 lb

## 2022-10-07 DIAGNOSIS — A310 Pulmonary mycobacterial infection: Secondary | ICD-10-CM

## 2022-10-07 DIAGNOSIS — G43809 Other migraine, not intractable, without status migrainosus: Secondary | ICD-10-CM | POA: Diagnosis not present

## 2022-10-07 DIAGNOSIS — Z7185 Encounter for immunization safety counseling: Secondary | ICD-10-CM | POA: Diagnosis not present

## 2022-10-07 MED ORDER — AZITHROMYCIN 500 MG PO TABS: 500.0000 mg | ORAL_TABLET | Freq: Every day | ORAL | 11 refills | Status: DC

## 2022-10-07 MED ORDER — ETHAMBUTOL HCL 400 MG PO TABS: 800.0000 mg | ORAL_TABLET | Freq: Every day | ORAL | 11 refills | Status: DC

## 2022-10-07 MED ORDER — RIFAMPIN 300 MG PO CAPS: 600.0000 mg | ORAL_CAPSULE | Freq: Every day | ORAL | 11 refills | Status: DC

## 2022-10-08 LAB — CBC WITH DIFFERENTIAL/PLATELET
Absolute Monocytes: 359 {cells}/uL (ref 200–950)
Basophils Absolute: 51 cells/uL (ref 0–200)
Basophils Relative: 1.1 %
Eosinophils Absolute: 202 {cells}/uL (ref 15–500)
Eosinophils Relative: 4.4 %
HCT: 35.7 % (ref 35.0–45.0)
Hemoglobin: 11.7 g/dL (ref 11.7–15.5)
Lymphs Abs: 529 {cells}/uL — ABNORMAL LOW (ref 850–3900)
MCH: 31.8 pg (ref 27.0–33.0)
MCHC: 32.8 g/dL (ref 32.0–36.0)
MCV: 97 fL (ref 80.0–100.0)
MPV: 10.8 fL (ref 7.5–12.5)
Monocytes Relative: 7.8 %
Neutro Abs: 3459 {cells}/uL (ref 1500–7800)
Neutrophils Relative %: 75.2 %
Platelets: 197 10*3/uL (ref 140–400)
RBC: 3.68 10*6/uL — ABNORMAL LOW (ref 3.80–5.10)
RDW: 12.4 % (ref 11.0–15.0)
Total Lymphocyte: 11.5 %
WBC: 4.6 10*3/uL (ref 3.8–10.8)

## 2022-10-08 LAB — COMPLETE METABOLIC PANEL WITH GFR
AG Ratio: 1.8 (calc) (ref 1.0–2.5)
ALT: 8 U/L (ref 6–29)
AST: 14 U/L (ref 10–35)
Albumin: 4 g/dL (ref 3.6–5.1)
Alkaline phosphatase (APISO): 98 U/L (ref 37–153)
BUN: 7 mg/dL (ref 7–25)
CO2: 30 mmol/L (ref 20–32)
Calcium: 8.9 mg/dL (ref 8.6–10.4)
Chloride: 108 mmol/L (ref 98–110)
Creat: 0.89 mg/dL (ref 0.60–0.95)
Globulin: 2.2 g/dL (calc) (ref 1.9–3.7)
Glucose, Bld: 115 mg/dL — ABNORMAL HIGH (ref 65–99)
Potassium: 3 mmol/L — ABNORMAL LOW (ref 3.5–5.3)
Sodium: 146 mmol/L (ref 135–146)
Total Bilirubin: 0.7 mg/dL (ref 0.2–1.2)
Total Protein: 6.2 g/dL (ref 6.1–8.1)
eGFR: 65 mL/min/{1.73_m2} (ref >=60)

## 2022-10-22 ENCOUNTER — Encounter (HOSPITAL_BASED_OUTPATIENT_CLINIC_OR_DEPARTMENT_OTHER): Payer: Self-pay | Admitting: Pulmonary Disease

## 2022-10-22 ENCOUNTER — Ambulatory Visit (HOSPITAL_BASED_OUTPATIENT_CLINIC_OR_DEPARTMENT_OTHER): Payer: Medicare PPO | Admitting: Pulmonary Disease

## 2022-10-22 VITALS — BP 136/82 | HR 82 | Resp 16 | Ht 66.0 in | Wt 111.6 lb

## 2022-10-22 DIAGNOSIS — A310 Pulmonary mycobacterial infection: Secondary | ICD-10-CM

## 2022-10-22 DIAGNOSIS — J479 Bronchiectasis, uncomplicated: Secondary | ICD-10-CM

## 2022-10-22 NOTE — Progress Notes (Signed)
[facility]  Chief Complaint  Patient presents with   Follow-up    Doing about the same. Has good and bad days. Saw infectious disease about a week ago.     Constitutional:  BP 136/82   Pulse 82   Resp 16   Ht 5\' 6"  (1.676 m)   Wt 111 lb 9.6 oz (50.6 kg)   SpO2 98%   BMI 18.01 kg/m   Past Medical History:  Rt breast cancer, Cataract, Diverticulosis, Esophageal stricture, GERD, Tremor, Vit D deficiency, Migraine HA, Osteoporosis, Plantar fasciitis, Vit B12 deficiency  Past Surgical History:  Her  has a past surgical history that includes Total vaginal hysterectomy; Myringotomy; Laparoscopic cholecystectomy; Cataract extraction (Right); Breast lumpectomy (Right); Tonsillectomy; Mastectomy partial / lumpectomy; Abdominal hysterectomy; Upper gastrointestinal endoscopy; Nose surgery; Mastoidectomy; and Colonoscopy.  Brief Summary:  [name] is a 82 y.o. 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      Subjective:   Remains on therapy for MAI.  Most recent sputum culture was negative.  Has dry cough.  Went to [facility] and was able to walk the whole park at a slow pace.  Not having fever, chest pain, or hemoptysis.  Weight has been stable.    Physical Exam:   Appearance - well kempt   ENMT - no sinus tenderness, no oral exudate, no LAN, Mallampati 3 airway, no stridor  Respiratory - equal breath sounds bilaterally, no wheezing or rales  CV - s1s2 regular rate and rhythm, no murmurs  Ext - no clubbing, no edema  Skin - no rashes  Psych - normal mood and affect    Pulmonary testing:  PFT 12/27/04 >> FEV1 2.34 (100%), FEV1% 56, TLC 6.04 (115%), DLCO 87% Bronchoscopy 02/04/07 >> Aspergillus species  PFT 05/21/12 >> FEV1 2.10 (98%), FEV1% 56%, TLC 4.83 (93%), DLCO 77% Sputum 09/15/14 >> MAI PFT 02/24/20 >> FEV1 1.64 (74%), FEV1% 54, TLC 7.95 (146%), DLCO 72%, +BD  Chest Imaging:  08/29/06 >> 6 mm nodule  lingula, 2 mm nodule LLL, BTX in lingula and RML,  01/17/07 >> no change 02/16/10 >> BTX RML/lingula/RLL/LLL 06/01/12 >> progression of tree in bud pattern 09/06/14 >> BTX, increased nodularity, 2 cm nodule RLL, 1.3 cm nodule LLL 08/09/15 >> decreased to resolved nodules, no change in BTX CT angio chest 06/27/17 >> BTX most on left, scattered nodularity HRCT chest 01/25/20 >>  Extensive moderate cylindrical and varicoid BTX most in upper/mid zones, medial LLL nodule now 1.5 x 1.1 cm (was 1.1 x 0.5 cm in May 2019) HRCT chest 04/28/20 >> stable to decreased size of nodules, changes of BTX  Cardiac Tests:  Echo 08/18/17 >> EF 45 to 50%, grade 1 DD, mild MR  Social History:  She  reports that she has never smoked. She has never used smokeless tobacco. She reports that she does not drink alcohol and does not use drugs.  Family History:  Her family history includes Bone cancer in her father; Heart attack in her paternal uncle; Heart disease (age of onset: 27) in her father; Leukemia in her mother; Parkinsonism in her mother.      Assessment/Plan:   Bronchiectasis in setting of MAI. - followed by Dr. 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 - no response to previous trials of inhaler therapy with ICS/LAMA/LABA - monitor clinically  Upper airway cough syndrome. - reluctant to use nasal steroids in setting of MAI - advised her to avoid clearing her throat or forcing a cough - she can use salt water gargles - sugarless candy to keep her mouth moist and sip water when she has an urge to cough - teaspoon of honey prn as antitussive agent  Dyspnea on exertion. - she is unable to walk more than 200 feet without needing to rest - she has handicap parking sticker  Migraine headaches. - follow up with PCP   Esophageal stricture. - she  is followed by Dr. Lavon Paganini with [facility]  Time Spent Involved in Patient Care on Day of Examination:  26 minutes  Follow up:   Patient Instructions  Follow up in 6 months  Medication List:   Allergies as of 10/22/2022       Reactions   Codeine Nausea And Vomiting   Droperidol Other (See Comments)   Nervous & shaking   Barbiturates Rash   Meperidine Hcl Nausea And Vomiting   Pb-hyoscy-atropine-scopolamine Other (See Comments)   Phenobarbital Rash   Sulfasalazine Other (See Comments)   REACTION: nausea   Sulfonamide Derivatives Nausea Only   REACTION: nausea        Medication List        Accurate as of October 22, 2022  4:33 PM. If you have any questions, ask your nurse or doctor.          STOP taking these medications    cephALEXin 500 MG capsule Commonly known as: KEFLEX Stopped by: Coralyn Helling       TAKE these medications    azithromycin 500 MG tablet Commonly known as: ZITHROMAX Take 1 tablet (500 mg total) by mouth daily.   cholecalciferol 1000 units tablet Commonly known as: VITAMIN D Take 50,000 Units by mouth. Reported on 08/02/2015   cyanocobalamin 1000 MCG tablet Take 500 mcg by mouth daily.   denosumab 60 MG/ML Sosy injection Commonly known as: PROLIA Prolia   ethambutol 400 MG tablet Commonly known as: MYAMBUTOL Take 2 tablets (800 mg total) by mouth daily.   Flutter Devi Use as directed   guaiFENesin 600 MG 12 hr tablet Commonly known as: MUCINEX Take 600 mg by mouth 2 (two) times daily. Reported on 08/02/2015   lansoprazole 30 MG disintegrating tablet Commonly known as: PREVACID SOLUTAB TAKE 1 TABLET (30 MG TOTAL) BY MOUTH 2 (TWO) TIMES DAILY BEFORE A MEAL.   promethazine 25 MG tablet Commonly known as: PHENERGAN Take 25 mg by mouth every 6 (six) hours as needed for nausea or vomiting.   propranolol 10 MG tablet Commonly known as: INDERAL Take 10 mg by mouth daily.   rifampin 300 MG capsule Commonly  known as: RIFADIN Take 2 capsules (600 mg total) by mouth daily.   SUMAtriptan 100 MG tablet Commonly known as: IMITREX Take 100 mg by mouth as needed. Reported on 04/14/2015   topiramate 200 MG tablet Commonly known as: TOPAMAX Take 200 mg by mouth daily. Reported on 04/14/2015   Vitamin D (Ergocalciferol) 1.25 MG (50000 UNIT) Caps capsule Commonly known as: DRISDOL Take 50,000 Units by mouth once a week.        Signature:  Coralyn Helling, MD [facility] Pager - [phone] 10/22/2022, 4:33 PM

## 2022-10-22 NOTE — Patient Instructions (Signed)
Follow up in 6 months 

## 2022-11-26 DIAGNOSIS — M810 Age-related osteoporosis without current pathological fracture: Secondary | ICD-10-CM | POA: Diagnosis not present

## 2022-11-26 DIAGNOSIS — Z1389 Encounter for screening for other disorder: Secondary | ICD-10-CM | POA: Diagnosis not present

## 2022-11-26 DIAGNOSIS — D510 Vitamin B12 deficiency anemia due to intrinsic factor deficiency: Secondary | ICD-10-CM | POA: Diagnosis not present

## 2022-12-03 DIAGNOSIS — M810 Age-related osteoporosis without current pathological fracture: Secondary | ICD-10-CM | POA: Diagnosis not present

## 2022-12-03 DIAGNOSIS — Z0000 Encounter for general adult medical examination without abnormal findings: Secondary | ICD-10-CM | POA: Diagnosis not present

## 2022-12-03 DIAGNOSIS — R82998 Other abnormal findings in urine: Secondary | ICD-10-CM | POA: Diagnosis not present

## 2022-12-03 DIAGNOSIS — Z1339 Encounter for screening examination for other mental health and behavioral disorders: Secondary | ICD-10-CM | POA: Diagnosis not present

## 2022-12-03 DIAGNOSIS — K219 Gastro-esophageal reflux disease without esophagitis: Secondary | ICD-10-CM | POA: Diagnosis not present

## 2022-12-03 DIAGNOSIS — J479 Bronchiectasis, uncomplicated: Secondary | ICD-10-CM | POA: Diagnosis not present

## 2022-12-03 DIAGNOSIS — Z23 Encounter for immunization: Secondary | ICD-10-CM | POA: Diagnosis not present

## 2022-12-03 DIAGNOSIS — G8929 Other chronic pain: Secondary | ICD-10-CM | POA: Diagnosis not present

## 2022-12-03 DIAGNOSIS — A310 Pulmonary mycobacterial infection: Secondary | ICD-10-CM | POA: Diagnosis not present

## 2022-12-03 DIAGNOSIS — Z1331 Encounter for screening for depression: Secondary | ICD-10-CM | POA: Diagnosis not present

## 2022-12-03 DIAGNOSIS — Z853 Personal history of malignant neoplasm of breast: Secondary | ICD-10-CM | POA: Diagnosis not present

## 2022-12-03 DIAGNOSIS — R1310 Dysphagia, unspecified: Secondary | ICD-10-CM | POA: Diagnosis not present

## 2022-12-12 ENCOUNTER — Other Ambulatory Visit (HOSPITAL_COMMUNITY): Payer: Self-pay | Admitting: *Deleted

## 2022-12-13 ENCOUNTER — Ambulatory Visit (HOSPITAL_COMMUNITY)
Admission: RE | Admit: 2022-12-13 | Discharge: 2022-12-13 | Disposition: A | Discharge status: Home / Self Care | Payer: Medicare PPO | Source: Ambulatory Visit | Attending: Internal Medicine | Admitting: Internal Medicine

## 2022-12-13 DIAGNOSIS — M810 Age-related osteoporosis without current pathological fracture: Secondary | ICD-10-CM | POA: Diagnosis not present

## 2022-12-13 MED ORDER — DENOSUMAB 60 MG/ML ~~LOC~~ SOSY
PREFILLED_SYRINGE | SUBCUTANEOUS | Status: DC
Start: 2022-12-13 — End: 2022-12-14
  Filled 2022-12-13: qty 1

## 2022-12-13 MED ORDER — DENOSUMAB 60 MG/ML ~~LOC~~ SOSY
60.0000 mg | PREFILLED_SYRINGE | Freq: Once | SUBCUTANEOUS | Status: AC
Start: 2022-12-13 — End: 2022-12-13
  Administered 2022-12-13: 60 mg via SUBCUTANEOUS

## 2022-12-16 DIAGNOSIS — Z1231 Encounter for screening mammogram for malignant neoplasm of breast: Secondary | ICD-10-CM | POA: Diagnosis not present

## 2023-04-22 DIAGNOSIS — G43719 Chronic migraine without aura, intractable, without status migrainosus: Secondary | ICD-10-CM | POA: Diagnosis not present

## 2023-09-23 ENCOUNTER — Ambulatory Visit: Payer: Medicare PPO | Admitting: Infectious Disease

## 2023-09-30 ENCOUNTER — Ambulatory Visit: Admitting: Infectious Disease

## 2023-10-09 ENCOUNTER — Other Ambulatory Visit: Payer: Self-pay | Admitting: Infectious Disease

## 2023-10-09 DIAGNOSIS — A310 Pulmonary mycobacterial infection: Secondary | ICD-10-CM

## 2023-10-10 ENCOUNTER — Other Ambulatory Visit: Payer: Self-pay

## 2023-10-10 DIAGNOSIS — A310 Pulmonary mycobacterial infection: Secondary | ICD-10-CM

## 2023-10-10 MED ORDER — AZITHROMYCIN 500 MG PO TABS: 500.0000 mg | ORAL_TABLET | Freq: Every day | ORAL | 2 refills | Status: DC

## 2023-10-14 ENCOUNTER — Ambulatory Visit: Admitting: Infectious Disease

## 2023-11-08 ENCOUNTER — Other Ambulatory Visit: Payer: Self-pay | Admitting: Infectious Disease

## 2023-11-08 DIAGNOSIS — A310 Pulmonary mycobacterial infection: Secondary | ICD-10-CM

## 2023-11-17 DIAGNOSIS — G43719 Chronic migraine without aura, intractable, without status migrainosus: Secondary | ICD-10-CM | POA: Diagnosis not present

## 2023-11-18 NOTE — Progress Notes (Unsigned)
 Subjective:  Chief Complaint: follow-up for M avium pulmonary infection   Patient ID: [name], female    DOB: 02-Jul-1940, 83 y.o.   MRN: [id number]  HPI  Discussed the use of AI scribe software for clinical note transcription with the patient, who gave verbal consent to proceed.  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  She is currently on a regimen of three antibiotics for Mycobacterium infection, including rifampin  and azithromycin . She received a notification from [facility] about being out of refills for her medications and is awaiting new prescriptions to be sent to [facility] at [facility] [address]. She has been on these medications since her initial diagnosis in 2000 and finds it easier to stay on them continuously rather than stopping and restarting, as long as it does not affect her liver. She recently paused her antibiotics for four to five days while taking DayQuil and NyQuil for a respiratory illness, which she refers to as 'the crud'.  She experiences intermittent coughing and has accepted this as a part of her life. No significant adverse effects from the COVID vaccine, although she felt unwell initially. She has not received a COVID vaccine since the one before last year but is interested in receiving a flu shot today, specifically the high-dose version.  Her social history includes a granddaughter who is currently in the nursing program at [employer] and is doing clinicals. She also mentioned her daughter had a rough year health-wise, which has impacted her energy levels.       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   Low serum vitamin D  03/06/2015   Lumbar disc herniation    Lung nodule 11/14/2020   Malaise 11/18/2017   Migraine    Nausea 07/16/2017   Osteopenia    Osteoporosis    EARLY STAGES   Ovarian cyst    Plantar fasciitis    Pneumonia    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,2009,2015   Vaccine counseling 11/09/2019   Vitamin B12 deficiency    Weight loss 11/30/2014    Past Surgical History:  Procedure Laterality Date   ABDOMINAL HYSTERECTOMY     BREAST LUMPECTOMY Right    CATARACT EXTRACTION Right    COLONOSCOPY     LAPAROSCOPIC CHOLECYSTECTOMY     MASTECTOMY PARTIAL / LUMPECTOMY     MASTOIDECTOMY     MYRINGOTOMY     with tympanoplasty and mastoidectomy   NOSE SURGERY     TONSILLECTOMY     TOTAL VAGINAL HYSTERECTOMY     UPPER GASTROINTESTINAL ENDOSCOPY      Family History  Problem Relation Age of Onset   Bone cancer Father    Heart disease Father 74       CAD-CABG   Leukemia Mother    Parkinsonism Mother    Heart attack Paternal  Uncle    Colon cancer Neg Hx    Stomach cancer Neg Hx    Esophageal cancer Neg Hx    Rectal cancer Neg Hx       Social History   Socioeconomic History   Marital status: Widowed    Spouse name: Not on file   Number of children: 2   Years of education: Not on file   Highest education level: Not on file  Occupational History   Occupation: Retired  Tobacco Use   Smoking status: Never   Smokeless tobacco: Never  Vaping Use   Vaping status: Never Used  Substance and Sexual Activity   Alcohol  use: No    Alcohol /week: 0.0 standard drinks of alcohol    Drug use: No   Sexual activity: Not Currently  Other Topics Concern   Not on file  Social History Narrative   Daily caffeine    Social Drivers of Research scientist (physical sciences) Strain: Not on file  Food Insecurity: Not on file  Transportation Needs: Not on file  Physical Activity: Not on file  Stress: Not on file  Social Connections: Not on file    Allergies  Allergen Reactions   Codeine Nausea And Vomiting   Droperidol Other (See Comments)    Nervous & shaking   Barbiturates Rash   Meperidine Hcl Nausea And Vomiting   Pb-Hyoscy-Atropine-Scopolamine Other (See Comments)   Phenobarbital Rash   Sulfasalazine Other (See Comments)    REACTION: nausea   Sulfonamide Derivatives Nausea Only    REACTION: nausea     Current Outpatient Medications:    azithromycin  (ZITHROMAX ) 500 MG tablet, Take 1 tablet (500 mg total) by mouth daily., Disp: 30 tablet, Rfl: 2   cholecalciferol (VITAMIN D ) 1000 units tablet, Take 50,000 Units by mouth. Reported on 08/02/2015, Disp: , Rfl:    cyanocobalamin  1000 MCG tablet, Take 500 mcg by mouth daily. , Disp: , Rfl:    denosumab  (PROLIA ) 60 MG/ML SOSY injection, Prolia , Disp: , Rfl:    ethambutol  (MYAMBUTOL ) 400 MG tablet, TAKE 2 TABLETS BY MOUTH DAILY., Disp: 60 tablet, Rfl: 0   guaiFENesin (MUCINEX) 600 MG 12 hr tablet, Take 600 mg by mouth 2 (two) times daily. Reported on 08/02/2015, Disp: , Rfl:    lansoprazole  (PREVACID  SOLUTAB) 30 MG disintegrating tablet, TAKE 1 TABLET (30 MG TOTAL) BY MOUTH 2 (TWO) TIMES DAILY BEFORE A MEAL. (Patient not taking: Reported on 10/22/2022), Disp: 60 tablet, Rfl: 11   promethazine (PHENERGAN) 25 MG tablet, Take 25 mg by mouth every 6 (six) hours as needed for nausea or vomiting., Disp: , Rfl:    propranolol (INDERAL) 10 MG tablet, Take 10 mg by mouth daily., Disp: , Rfl:    Respiratory Therapy Supplies (FLUTTER) DEVI, Use as directed, Disp: 1 each, Rfl: 0   rifampin  (RIFADIN ) 300 MG capsule, TAKE 2 CAPSULES BY MOUTH DAILY., Disp: 60 capsule, Rfl: 2   SUMAtriptan (IMITREX) 100 MG tablet, Take 100 mg by mouth as needed. Reported on 04/14/2015, Disp: , Rfl:    topiramate (TOPAMAX) 200 MG  tablet, Take 200 mg by mouth daily. Reported on 04/14/2015, Disp: , Rfl:    Vitamin D , Ergocalciferol , (DRISDOL) 1.25 MG (50000 UNIT) CAPS capsule, Take 50,000 Units by mouth once a week., Disp: , Rfl:   Current Facility-Administered Medications:    0.9 %  sodium chloride  infusion, 500 mL, Intravenous, Continuous, Nandigam, Kavitha V, MD]  Review of Systems  Constitutional:  Negative for activity change, appetite change, chills, diaphoresis,  fatigue, fever and unexpected weight change.  HENT:  Negative for congestion, rhinorrhea, sinus pressure, sneezing, sore throat and trouble swallowing.   Eyes:  Negative for photophobia and visual disturbance.  Respiratory:  Negative for cough, chest tightness, shortness of breath, wheezing and stridor.   Cardiovascular:  Negative for chest pain, palpitations and leg swelling.  Gastrointestinal:  Negative for abdominal distention, abdominal pain, anal bleeding, blood in stool, constipation, diarrhea, nausea and vomiting.  Genitourinary:  Negative for difficulty urinating, dysuria, flank pain and hematuria.  Musculoskeletal:  Negative for arthralgias, back pain, gait problem, joint swelling and myalgias.  Skin:  Negative for color change, pallor, rash and wound.  Neurological:  Negative for dizziness, tremors, weakness and light-headedness.  Hematological:  Negative for adenopathy. Does not bruise/bleed easily.  Psychiatric/Behavioral:  Negative for agitation, behavioral problems, confusion, decreased concentration, dysphoric mood and sleep disturbance.        Objective:   Physical Exam Constitutional:      General: She is not in acute distress.    Appearance: Normal appearance. She is well-developed. She is not ill-appearing or diaphoretic.  HENT:     Head: Normocephalic and atraumatic.     Right Ear: Hearing and external ear normal.     Left Ear: Hearing and external ear normal.     Nose: No nasal deformity or rhinorrhea.  Eyes:     General: No  scleral icterus.    Conjunctiva/sclera: Conjunctivae normal.     Right eye: Right conjunctiva is not injected.     Left eye: Left conjunctiva is not injected.     Pupils: Pupils are equal, round, and reactive to light.  Neck:     Vascular: No JVD.  Cardiovascular:     Rate and Rhythm: Normal rate and regular rhythm.     Heart sounds: S1 normal and S2 normal. No murmur heard.    No friction rub. No gallop.  Pulmonary:     Effort: Pulmonary effort is normal. Prolonged expiration present. No respiratory distress.     Breath sounds: No stridor. No wheezing or rhonchi.  Abdominal:     General: Bowel sounds are normal. There is no distension.     Palpations: Abdomen is soft.     Tenderness: There is no abdominal tenderness.  Musculoskeletal:        General: Normal range of motion.     Right shoulder: Normal.     Left shoulder: Normal.     Cervical back: Normal range of motion and neck supple.     Right hip: Normal.     Left hip: Normal.     Right knee: Normal.     Left knee: Normal.  Lymphadenopathy:     Head:     Right side of head: No submandibular, preauricular or posterior auricular adenopathy.     Left side of head: No submandibular, preauricular or posterior auricular adenopathy.     Cervical: No cervical adenopathy.     Right cervical: No superficial or deep cervical adenopathy.    Left cervical: No superficial or deep cervical adenopathy.  Skin:    General: Skin is warm and dry.     Coloration: Skin is not pale.     Findings: No abrasion, bruising, ecchymosis, erythema, lesion or rash.     Nails: There is no clubbing.  Neurological:     Mental Status: She is alert and oriented to person, place, and time.     Sensory: No sensory deficit.     Coordination:  Coordination normal.     Gait: Gait normal.  Psychiatric:        Attention and Perception: She is attentive.        Mood and Affect: Mood normal.        Speech: Speech normal.        Behavior: Behavior normal.  Behavior is cooperative.        Thought Content: Thought content normal.        Judgment: Judgment normal.           Assessment & Plan:   Assessment and Plan    Chronic pulmonary mycobacterial infection Managed with azithromycin  and rifampin . She intermittently pauses medication during illness episodes and understands the importance of not taking azithromycin  alone for extended periods. Liver function tests are regularly monitored. - Order refills for azithromycin  and rifampin  at [facility] [address]. - Continue current antibiotic regimen. - Monitor liver function tests during the upcoming annual visit with her primary care physician.  Immunization counseling and administration (influenza vaccine) Opted for high-dose influenza vaccine today. Previously received COVID-19 vaccinations but declined the updated COVID-19 vaccine, despite understanding the potential benefits of concurrent administration with the flu vaccine. - Administer high-dose influenza vaccine today.

## 2023-11-19 ENCOUNTER — Ambulatory Visit: Admitting: Infectious Disease

## 2023-11-19 ENCOUNTER — Other Ambulatory Visit: Payer: Self-pay

## 2023-11-19 ENCOUNTER — Encounter: Payer: Self-pay | Admitting: Infectious Disease

## 2023-11-19 VITALS — BP 143/83 | HR 87 | Temp 98.5°F | Wt 113.0 lb

## 2023-11-19 DIAGNOSIS — Z7185 Encounter for immunization safety counseling: Secondary | ICD-10-CM

## 2023-11-19 DIAGNOSIS — Z23 Encounter for immunization: Secondary | ICD-10-CM | POA: Diagnosis not present

## 2023-11-19 DIAGNOSIS — A310 Pulmonary mycobacterial infection: Secondary | ICD-10-CM

## 2023-11-19 DIAGNOSIS — J449 Chronic obstructive pulmonary disease, unspecified: Secondary | ICD-10-CM | POA: Diagnosis not present

## 2023-11-19 MED ORDER — ETHAMBUTOL HCL 400 MG PO TABS: 800.0000 mg | ORAL_TABLET | Freq: Every day | ORAL | 11 refills | Status: AC

## 2023-11-19 MED ORDER — RIFAMPIN 300 MG PO CAPS: 600.0000 mg | ORAL_CAPSULE | Freq: Every day | ORAL | 11 refills | Status: AC

## 2023-11-19 MED ORDER — AZITHROMYCIN 500 MG PO TABS: 500.0000 mg | ORAL_TABLET | Freq: Every day | ORAL | 11 refills | Status: AC

## 2023-12-18 DIAGNOSIS — Z79899 Other long term (current) drug therapy: Secondary | ICD-10-CM | POA: Diagnosis not present

## 2023-12-18 DIAGNOSIS — K219 Gastro-esophageal reflux disease without esophagitis: Secondary | ICD-10-CM | POA: Diagnosis not present

## 2023-12-18 DIAGNOSIS — D510 Vitamin B12 deficiency anemia due to intrinsic factor deficiency: Secondary | ICD-10-CM | POA: Diagnosis not present

## 2023-12-18 LAB — LAB REPORT - SCANNED: EGFR: 68.5

## 2023-12-25 DIAGNOSIS — K219 Gastro-esophageal reflux disease without esophagitis: Secondary | ICD-10-CM | POA: Diagnosis not present

## 2023-12-25 DIAGNOSIS — Z1339 Encounter for screening examination for other mental health and behavioral disorders: Secondary | ICD-10-CM | POA: Diagnosis not present

## 2023-12-25 DIAGNOSIS — Z1331 Encounter for screening for depression: Secondary | ICD-10-CM | POA: Diagnosis not present

## 2023-12-25 DIAGNOSIS — J479 Bronchiectasis, uncomplicated: Secondary | ICD-10-CM | POA: Diagnosis not present

## 2023-12-25 DIAGNOSIS — F5104 Psychophysiologic insomnia: Secondary | ICD-10-CM | POA: Diagnosis not present

## 2023-12-25 DIAGNOSIS — R7989 Other specified abnormal findings of blood chemistry: Secondary | ICD-10-CM | POA: Diagnosis not present

## 2023-12-25 DIAGNOSIS — R82998 Other abnormal findings in urine: Secondary | ICD-10-CM | POA: Diagnosis not present

## 2023-12-25 DIAGNOSIS — Z853 Personal history of malignant neoplasm of breast: Secondary | ICD-10-CM | POA: Diagnosis not present

## 2023-12-25 DIAGNOSIS — D510 Vitamin B12 deficiency anemia due to intrinsic factor deficiency: Secondary | ICD-10-CM | POA: Diagnosis not present

## 2023-12-25 DIAGNOSIS — Z0000 Encounter for general adult medical examination without abnormal findings: Secondary | ICD-10-CM | POA: Diagnosis not present

## 2023-12-25 DIAGNOSIS — M810 Age-related osteoporosis without current pathological fracture: Secondary | ICD-10-CM | POA: Diagnosis not present

## 2023-12-25 DIAGNOSIS — A310 Pulmonary mycobacterial infection: Secondary | ICD-10-CM | POA: Diagnosis not present

## 2024-11-18 ENCOUNTER — Ambulatory Visit: Admitting: Infectious Disease
